# Patient Record
Sex: Female | Born: 1940 | Race: Black or African American | Hispanic: No | Marital: Married | Smoking: Never smoker
Health system: Southern US, Community
[De-identification: ages and names within clinical notes are randomized; demographics above are authoritative.]

## PROBLEM LIST (undated history)

## (undated) DIAGNOSIS — I10 Essential (primary) hypertension: Secondary | ICD-10-CM

## (undated) DIAGNOSIS — E119 Type 2 diabetes mellitus without complications: Secondary | ICD-10-CM

## (undated) DIAGNOSIS — K219 Gastro-esophageal reflux disease without esophagitis: Secondary | ICD-10-CM

## (undated) DIAGNOSIS — M810 Age-related osteoporosis without current pathological fracture: Secondary | ICD-10-CM

## (undated) DIAGNOSIS — E785 Hyperlipidemia, unspecified: Secondary | ICD-10-CM

## (undated) DIAGNOSIS — K5090 Crohn's disease, unspecified, without complications: Secondary | ICD-10-CM

## (undated) DIAGNOSIS — K859 Acute pancreatitis without necrosis or infection, unspecified: Secondary | ICD-10-CM

## (undated) HISTORY — PX: COLONOSCOPY: SHX174

## (undated) HISTORY — PX: BOWEL RESECTION: SHX1257

## (undated) HISTORY — DX: Type 2 diabetes mellitus without complications: E11.9

## (undated) HISTORY — DX: Acute pancreatitis without necrosis or infection, unspecified: K85.90

## (undated) HISTORY — DX: Essential (primary) hypertension: I10

## (undated) HISTORY — DX: Hyperlipidemia, unspecified: E78.5

## (undated) HISTORY — DX: Crohn's disease, unspecified, without complications: K50.90

## (undated) HISTORY — DX: Age-related osteoporosis without current pathological fracture: M81.0

## (undated) HISTORY — PX: PARTIAL HYSTERECTOMY: SHX80

---

## 2002-07-18 HISTORY — PX: RECTOVAGINAL FISTULA CLOSURE: SUR265

## 2015-11-17 ENCOUNTER — Encounter: Payer: Self-pay | Admitting: Internal Medicine

## 2015-12-08 ENCOUNTER — Encounter: Payer: Self-pay | Admitting: Gastroenterology

## 2015-12-08 ENCOUNTER — Ambulatory Visit (INDEPENDENT_AMBULATORY_CARE_PROVIDER_SITE_OTHER): Payer: Medicare (Managed Care) | Admitting: Gastroenterology

## 2015-12-08 VITALS — BP 116/71 | HR 84 | Temp 97.7°F | Ht 59.0 in | Wt 107.0 lb

## 2015-12-08 DIAGNOSIS — K50819 Crohn's disease of both small and large intestine with unspecified complications: Secondary | ICD-10-CM

## 2015-12-08 DIAGNOSIS — K5090 Crohn's disease, unspecified, without complications: Secondary | ICD-10-CM | POA: Insufficient documentation

## 2015-12-08 NOTE — Assessment & Plan Note (Addendum)
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 Has been on mercaptopurine for years. Recently when she was discharged from the hospital in [place] she was told to decrease mercaptopurine to every other day. Patient is unsure why. Last colonoscopy around 2008 per records. At this point patient states she is feeling better. Her abdominal pain is much improved. Continues to fluctuate with no bowel movement 1 day, followed by multiple loose stools the following. She is concerned about ongoing weight loss, loss of hair, poor nail growth.   Retrieve records from recent hospitalization as well as from Dr. Docia Furl.  She is at risk of nutritional deficiencies with multiple surgeries and history of Crohn's disease. Once reviewed, further recommendations to follow.

## 2015-12-08 NOTE — Progress Notes (Addendum)
REVIEWED-NO ADDITIONAL RECOMMENDATIONS.  Primary Care Physician:  Barney Drain, MD  Primary Gastroenterologist:  Barney Drain, MD   Chief Complaint  Patient presents with  . Crohn's Disease  . set up TCS    HPI:  Tamara Todd is a 75 y.o. female here at the request of her PCP to establish care. Patient has a history of complicated Crohn's involving what sounds like both her small bowel and colon. 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 She has had Crohn's for at least 30 years. Previously treated with prednisone. Developed SOB with second dose of Remicade in remote. Has bee on mercaptopurine for years.   Patient recently had a flare in 09/2015, hospitalized at [facility]. States she had inflammation of the pancreas and active Crohn's. Treated with steroids. Magnesium level has been low. Reports abnormal CT.  Last colonoscopy in 2008 by Dr. Algis Greenhouse. Records requested. No prior EGD.   States she was discharged with reduced dose of mercaptopurine, every other day now. She is not sure why. Days she takes the medication seems to have more stools. Will go without a BM one day and the next day have 5-7 loose stools. No melena, brbpr. RLQ abdominal pain much better. Appetite good. +indigestion. Complains of hair loss, nails don't grow. She has osteoporosis, no longer on injections. Just on calcium. Followed by endocrinology. Complains of 15 pound weight loss in past 18 months. Most of weight loss more recently.     Current Outpatient Prescriptions  Medication Sig Dispense Refill  . amLODipine (NORVASC) 10 MG tablet Take 10 mg by mouth daily.  1  . diclofenac (VOLTAREN) 75 MG EC tablet Take 75 mg by mouth daily.  1  . losartan-hydrochlorothiazide (HYZAAR) 50-12.5 MG tablet Take 1 tablet by mouth daily.  0  . magnesium oxide (MAG-OX) 400 (241.3 Mg) MG tablet Take 1 tablet by mouth 2 (two) times daily with a meal.   0  . mercaptopurine (PURINETHOL) 50 MG tablet TAKE 1 & 1/2 TABLETS BY MOUTH DAILY **1/10**  0  . metFORMIN (GLUCOPHAGE) 500 MG tablet TAKE 1/2 TABLET BY MOUTH DAILY FOR 1 WEEK THEN 1 TAB DAILY FOR 1 WEEK THEN 1 TAB TWICE DAILY  6  . metoprolol succinate (TOPROL-XL) 25 MG 24 hr tablet Take 25 mg by mouth daily.  3  . omeprazole (PRILOSEC) 20 MG capsule Take 20 mg by mouth daily.  3  . Vitamin D, Ergocalciferol, (DRISDOL) 50000 units CAPS capsule Take 50,000 Units by mouth once a week.  1   No current facility-administered medications for this visit.    Allergies as of 12/08/2015 - Review Complete 12/08/2015  Allergen Reaction Noted  . Lisinopril  12/08/2015  . Remicade [infliximab] Other (See Comments) 12/08/2015  . Codeine Rash 12/08/2015    Past Medical History  Diagnosis Date  . Osteoporosis        . HTN (hypertension)   . Hyperlipidemia   . Crohn's disease ([place])     used to be followed by Dr. Algis Greenhouse. diagnosis in her 21s. multiple surgeries. remicade allergy.  . Diabetes [facility])     Past Surgical History  Procedure Laterality Date  . Bowel resection      TWICE for stricture, including right colectomy with terminal ileal resection  . Rectovaginal fistula closure  2004    with temporary colostomy and subsequent take down, all at [facility]   . Partial hysterectomy      Family History  Problem Relation Age of Onset  . Colon cancer Father     32s  . Inflammatory bowel disease Sister     ???    Social History   Social History  . Marital Status: Unknown    Spouse Name: N/A  . Number of Children: 1  . Years of Education: N/A   Occupational History  . retired    Social History Main Topics  . Smoking status: Never Smoker   . Smokeless tobacco: Not on file  . Alcohol Use: No  . Drug Use: No  . Sexual Activity: Not on file   Other Topics Concern  . Not on file   Social History Narrative  . No narrative on file      ROS:  General: Negative for anorexia,  weight loss, fever, chills, fatigue, weakness. Eyes: Negative for vision changes.  ENT: Negative for hoarseness, difficulty swallowing , nasal congestion. CV: Negative for chest pain, angina, palpitations, dyspnea on exertion, peripheral edema.  Respiratory: Negative for dyspnea at rest, dyspnea on exertion, cough, sputum, wheezing.  GI: See history of present illness. GU:  Negative for dysuria, hematuria, urinary incontinence, urinary frequency, nocturnal urination.  MS: Negative for joint pain, low back pain.  Derm: Negative for rash or itching.  Neuro: Negative for weakness, abnormal sensation, seizure, frequent headaches, memory loss, confusion.  Psych: Negative for anxiety, depression, suicidal ideation, hallucinations.  Endo: Negative for unusual weight change.  Heme: Negative for bruising or bleeding. Allergy: Negative for rash or hives.    Physical Examination:  BP 116/71 mmHg  Pulse 84  Temp(Src) 97.7 F (36.5 C)  Ht 4' 11"  (1.499 m)  Wt 107 lb (48.535 kg)  BMI 21.60 kg/m2  LMP    General: Well-nourished, well-developed in no acute distress.  Head: Normocephalic, atraumatic.   Eyes: Conjunctiva pink, no icterus. Mouth: Oropharyngeal mucosa moist and pink , no lesions erythema or exudate. Neck: Supple without thyromegaly, masses, or lymphadenopathy.  Lungs: Clear to auscultation bilaterally.  Heart: Regular rate and rhythm, no murmurs rubs or gallops.  Abdomen: Bowel sounds are normal, mild rlq tenderness, nondistended, no hepatosplenomegaly or masses, no abdominal bruits or    hernia , no rebound or guarding.  Well-healed incisions. Rectal: not performed Extremities: No lower extremity edema. No clubbing or deformities.  Neuro: Alert and oriented x 4 , grossly normal neurologically.  Skin: Warm and dry, no rash or jaundice.   Psych: Alert and cooperative, normal mood and affect.  Labs: requested  Imaging Studies: No results found.

## 2015-12-08 NOTE — Patient Instructions (Signed)
1. We will request your records. Further recommendations, once reviewed.

## 2015-12-08 NOTE — Progress Notes (Signed)
CC'ED TO PCP 

## 2015-12-24 ENCOUNTER — Encounter: Payer: Self-pay | Admitting: Gastroenterology

## 2015-12-24 NOTE — Progress Notes (Addendum)
Reviewed records from outside source. Outlined below.   [facility] 10/09/2015 White blood cell count 2850, hemoglobin 9, hematocrit 27, MCV 109.8, platelets 155,000 INR 1.5, creatinine 0.44, total bilirubin 0.6, AST 44, ALT 42, alkaline phosphatase 72, the day prior her alkaline phosphatase is 124, AST 58, total bilirubin 1.2 all elevated. Lipase on admission over 10,000, day of discharge 1013, hemoglobin A1c 9.6.magnesium level 1.2 low  Abdominal ultrasound 10/08/2015. Common bile duct 9 mm slightly prominent. Gallbladder without gallbladder wall thickening or stones. There was some pericholecystic fluid present.  CT abdomen and pelvis with contrast on 10/08/2015. Gallbladder unremarkable. Vague stranding adjacent to the pancreas. Mild thickening of the distal rectum with vague stranding seen within the right abdomen adjacent to the posterior gallbladder and kidney.    Colonoscopy from July 2008, Dr. Docia Furl Small ulcerations in the distal small bowel, consistent with minimal recurrent disease. Ulcerations are just at the ileocolonic anastomosis. Biopsy with acute and chronic inflammation compatible with but not diagnostic of Crohn's disease of the distal small bowel.  2008, [facility] celiac plus panel was negative.   Please let patient know I have reviewed her records.  Recommendations: Labs including CBC, iron/TIBC, ferritin, B12, folate, CMET, magnesium level, PT/INR If she has not had bone density DEXA scan, she needs a baseline.

## 2015-12-29 ENCOUNTER — Other Ambulatory Visit: Payer: Self-pay

## 2015-12-29 DIAGNOSIS — K50819 Crohn's disease of both small and large intestine with unspecified complications: Secondary | ICD-10-CM

## 2015-12-29 LAB — CBC WITH DIFFERENTIAL/PLATELET
BASOS ABS: 38 {cells}/uL (ref 0–200)
BASOS PCT: 1 %
EOS ABS: 190 {cells}/uL (ref 15–500)
Eosinophils Relative: 5 %
HCT: 30.2 % — ABNORMAL LOW (ref 35.0–45.0)
HEMOGLOBIN: 9.7 g/dL — AB (ref 11.7–15.5)
LYMPHS ABS: 1064 {cells}/uL (ref 850–3900)
Lymphocytes Relative: 28 %
MCH: 33.6 pg — AB (ref 27.0–33.0)
MCHC: 32.1 g/dL (ref 32.0–36.0)
MCV: 104.5 fL — AB (ref 80.0–100.0)
MPV: 9.7 fL (ref 7.5–12.5)
Monocytes Absolute: 190 cells/uL — ABNORMAL LOW (ref 200–950)
Monocytes Relative: 5 %
NEUTROS ABS: 2318 {cells}/uL (ref 1500–7800)
Neutrophils Relative %: 61 %
Platelets: 179 10*3/uL (ref 140–400)
RBC: 2.89 MIL/uL — ABNORMAL LOW (ref 3.80–5.10)
RDW: 16.1 % — ABNORMAL HIGH (ref 11.0–15.0)
WBC: 3.8 10*3/uL (ref 3.8–10.8)

## 2015-12-29 LAB — PROTIME-INR
INR: 1
PROTHROMBIN TIME: 10.7 s (ref 9.0–11.5)

## 2015-12-29 NOTE — Progress Notes (Signed)
Pt is aware. Will do labs at [facility]. She said she had a Dexa about a year ago. She will call have those results sent to [place].

## 2015-12-30 LAB — COMPREHENSIVE METABOLIC PANEL
ALBUMIN: 3.7 g/dL (ref 3.6–5.1)
ALK PHOS: 58 U/L (ref 33–130)
ALT: 17 U/L (ref 6–29)
AST: 22 U/L (ref 10–35)
BILIRUBIN TOTAL: 0.4 mg/dL (ref 0.2–1.2)
BUN: 22 mg/dL (ref 7–25)
CHLORIDE: 103 mmol/L (ref 98–110)
CO2: 27 mmol/L (ref 20–31)
CREATININE: 0.77 mg/dL (ref 0.60–0.93)
Calcium: 10.5 mg/dL — ABNORMAL HIGH (ref 8.6–10.4)
Glucose, Bld: 96 mg/dL (ref 65–99)
Potassium: 4.1 mmol/L (ref 3.5–5.3)
SODIUM: 139 mmol/L (ref 135–146)
TOTAL PROTEIN: 7 g/dL (ref 6.1–8.1)

## 2015-12-30 LAB — VITAMIN B12: Vitamin B-12: 906 pg/mL (ref 200–1100)

## 2015-12-30 LAB — IRON AND TIBC
%SAT: 16 % (ref 11–50)
IRON: 69 ug/dL (ref 45–160)
TIBC: 432 ug/dL (ref 250–450)
UIBC: 363 ug/dL (ref 125–400)

## 2015-12-30 LAB — MAGNESIUM: MAGNESIUM: 1.5 mg/dL (ref 1.5–2.5)

## 2015-12-30 LAB — FOLATE: FOLATE: 22.7 ng/mL (ref >5.4)

## 2015-12-30 LAB — FERRITIN: FERRITIN: 29 ng/mL (ref 20–288)

## 2016-01-05 NOTE — Progress Notes (Signed)
Quick Note:  Called. Many rings and no answer. ______

## 2016-01-05 NOTE — Progress Notes (Signed)
Quick Note:  Her labs look good except her H/H is low (but stable compared to 09/2015). No B12, folate, iron deficiency.  She needs ileocolonoscopy+/- EGD for anemia, crohn's, idiopathic pancreatitis. WITH SLF Day of prep: 1/2 dose metformin.  Based on findings, she will likely need additional testing for idiopathic pancreatitis such as MRI. ______

## 2016-01-11 ENCOUNTER — Telehealth: Payer: Self-pay

## 2016-01-11 NOTE — Progress Notes (Signed)
Quick Note:  PT is aware of results. Triaged and scheduled for TCS with possible EGD. See separate triage. ______

## 2016-01-20 NOTE — Progress Notes (Signed)
Quick Note:  Routed back to [place]. ______

## 2016-01-29 NOTE — Telephone Encounter (Signed)
Gastroenterology Pre-Procedure Review  Request Date: 01/11/2016 Requesting Physician: Neil Crouch, PA/ Dr. Oneida Alar  PATIENT REVIEW QUESTIONS: The patient responded to the following health history questions as indicated:    Pt was triaged for a ileocolonoscopy plus or minus an EGD per Neil Crouch, PA  1. Diabetes Melitis: no 2. Joint replacements in the past 12 months: no 3. Major health problems in the past 3 months: no 4. Has an artificial valve or MVP: no 5. Has a defibrillator: no 6. Has been advised in past to take antibiotics in advance of a procedure like teeth cleaning: no 7. Family history of colon cancer: no  8. Alcohol Use: no 9. History of sleep apnea: no     MEDICATIONS & ALLERGIES:    Patient reports the following regarding taking any blood thinners:   Plavix? no Aspirin? no Coumadin? no  Patient confirms/reports the following medications:  Current Outpatient Prescriptions  Medication Sig Dispense Refill  . amLODipine (NORVASC) 10 MG tablet Take 10 mg by mouth daily.  1  . diclofenac (VOLTAREN) 75 MG EC tablet Take 75 mg by mouth daily.  1  . losartan-hydrochlorothiazide (HYZAAR) 50-12.5 MG tablet Take 1 tablet by mouth daily.  0  . magnesium oxide (MAG-OX) 400 (241.3 Mg) MG tablet Take 1 tablet by mouth 2 (two) times daily with a meal.  0  . mercaptopurine (PURINETHOL) 50 MG tablet Takes one and half tablets every other day  0  . metFORMIN (GLUCOPHAGE) 500 MG tablet TAKE 1/2 TABLET BY MOUTH DAILY FOR 1 WEEK THEN 1 TAB DAILY FOR 1 WEEK THEN 1 TAB TWICE DAILY  6  . metoprolol succinate (TOPROL-XL) 25 MG 24 hr tablet Take 25 mg by mouth daily.  3  . omeprazole (PRILOSEC) 20 MG capsule Take 20 mg by mouth daily.  3  . Vitamin D, Ergocalciferol, (DRISDOL) 50000 units CAPS capsule Take 50,000 Units by mouth once a week.  1   No current facility-administered medications for this visit.    Patient confirms/reports the following allergies:  Allergies  Allergen  Reactions  . Lisinopril     Cough   . Remicade [Infliximab] Other (See Comments)    Couldn't breath  . Codeine Rash    No orders of the defined types were placed in this encounter.    AUTHORIZATION INFORMATION Primary Insurance:   ID #:   Group #:  Pre-Cert / Auth required: Pre-Cert / Auth #:   Secondary Insurance:   ID #: Group #:  Pre-Cert / Auth required:  Pre-Cert / Auth #:   SCHEDULE INFORMATION: Procedure has been scheduled as follows:  Date:  02/10/2016                      Time: 9:30 Am  Location: [facility] Short Stay  This Gastroenterology Pre-Precedure Review Form is being routed to the following provider(s): Barney Drain, MD

## 2016-02-01 NOTE — Telephone Encounter (Signed)
Day of prep 1/2 dose metformin.

## 2016-02-02 ENCOUNTER — Other Ambulatory Visit: Payer: Self-pay

## 2016-02-02 ENCOUNTER — Telehealth: Payer: Self-pay | Admitting: Gastroenterology

## 2016-02-02 DIAGNOSIS — D649 Anemia, unspecified: Secondary | ICD-10-CM

## 2016-02-02 DIAGNOSIS — K50919 Crohn's disease, unspecified, with unspecified complications: Secondary | ICD-10-CM

## 2016-02-02 DIAGNOSIS — K861 Other chronic pancreatitis: Secondary | ICD-10-CM

## 2016-02-02 MED ORDER — PEG 3350-KCL-NA BICARB-NACL 420 G PO SOLR: 4000.0000 mL | ORAL | Status: DC

## 2016-02-02 NOTE — Addendum Note (Signed)
Addended by: Everardo All on: 02/02/2016 11:28 AM   Modules accepted: Orders

## 2016-02-02 NOTE — Telephone Encounter (Signed)
Bone density study 04/2014: AP spine T -4.8 Femoral neck T -2.6 Total Hip T -1.7   Osteoporosis per WHO criteria.   Patient is not on medication for osteoporosis. Would recommend discuss potential treatments with her endocrinologist or her PCP.

## 2016-02-02 NOTE — Telephone Encounter (Signed)
Rx sent to the pharmacy and instructions mailed to pt. Tamara Todd is aware to put ileocolonoscopy in the comments.

## 2016-02-03 NOTE — Telephone Encounter (Signed)
Pt is aware.  

## 2016-02-05 NOTE — Telephone Encounter (Signed)
No PA is needed per phone call. Ref# [id number]

## 2016-02-10 ENCOUNTER — Ambulatory Visit (HOSPITAL_COMMUNITY)
Admission: RE | Admit: 2016-02-10 | Discharge: 2016-02-10 | Disposition: A | Discharge status: Home / Self Care | Payer: Medicare Other | Source: Ambulatory Visit | Attending: Gastroenterology | Admitting: Gastroenterology

## 2016-02-10 ENCOUNTER — Encounter (HOSPITAL_COMMUNITY): Payer: Self-pay | Admitting: *Deleted

## 2016-02-10 ENCOUNTER — Encounter (HOSPITAL_COMMUNITY)
Admission: RE | Disposition: A | Discharge status: Home / Self Care | Payer: Self-pay | Source: Ambulatory Visit | Attending: Gastroenterology

## 2016-02-10 DIAGNOSIS — K621 Rectal polyp: Secondary | ICD-10-CM

## 2016-02-10 DIAGNOSIS — D128 Benign neoplasm of rectum: Secondary | ICD-10-CM | POA: Diagnosis not present

## 2016-02-10 DIAGNOSIS — K861 Other chronic pancreatitis: Secondary | ICD-10-CM

## 2016-02-10 DIAGNOSIS — K219 Gastro-esophageal reflux disease without esophagitis: Secondary | ICD-10-CM | POA: Diagnosis not present

## 2016-02-10 DIAGNOSIS — K5730 Diverticulosis of large intestine without perforation or abscess without bleeding: Secondary | ICD-10-CM | POA: Diagnosis not present

## 2016-02-10 DIAGNOSIS — I10 Essential (primary) hypertension: Secondary | ICD-10-CM | POA: Insufficient documentation

## 2016-02-10 DIAGNOSIS — R059 Cough, unspecified: Secondary | ICD-10-CM

## 2016-02-10 DIAGNOSIS — Z9049 Acquired absence of other specified parts of digestive tract: Secondary | ICD-10-CM | POA: Insufficient documentation

## 2016-02-10 DIAGNOSIS — Z800 Family history of malignant neoplasm of digestive organs: Secondary | ICD-10-CM | POA: Insufficient documentation

## 2016-02-10 DIAGNOSIS — Z79899 Other long term (current) drug therapy: Secondary | ICD-10-CM | POA: Insufficient documentation

## 2016-02-10 DIAGNOSIS — R634 Abnormal weight loss: Secondary | ICD-10-CM | POA: Diagnosis not present

## 2016-02-10 DIAGNOSIS — K2950 Unspecified chronic gastritis without bleeding: Secondary | ICD-10-CM | POA: Insufficient documentation

## 2016-02-10 DIAGNOSIS — E119 Type 2 diabetes mellitus without complications: Secondary | ICD-10-CM | POA: Insufficient documentation

## 2016-02-10 DIAGNOSIS — Z6822 Body mass index (BMI) 22.0-22.9, adult: Secondary | ICD-10-CM | POA: Insufficient documentation

## 2016-02-10 DIAGNOSIS — K5080 Crohn's disease of both small and large intestine without complications: Secondary | ICD-10-CM | POA: Diagnosis not present

## 2016-02-10 DIAGNOSIS — M810 Age-related osteoporosis without current pathological fracture: Secondary | ICD-10-CM | POA: Insufficient documentation

## 2016-02-10 DIAGNOSIS — Z7984 Long term (current) use of oral hypoglycemic drugs: Secondary | ICD-10-CM | POA: Insufficient documentation

## 2016-02-10 DIAGNOSIS — K449 Diaphragmatic hernia without obstruction or gangrene: Secondary | ICD-10-CM | POA: Insufficient documentation

## 2016-02-10 DIAGNOSIS — K648 Other hemorrhoids: Secondary | ICD-10-CM | POA: Diagnosis not present

## 2016-02-10 DIAGNOSIS — K634 Enteroptosis: Secondary | ICD-10-CM

## 2016-02-10 DIAGNOSIS — K2970 Gastritis, unspecified, without bleeding: Secondary | ICD-10-CM

## 2016-02-10 DIAGNOSIS — K317 Polyp of stomach and duodenum: Secondary | ICD-10-CM | POA: Diagnosis not present

## 2016-02-10 DIAGNOSIS — K50919 Crohn's disease, unspecified, with unspecified complications: Secondary | ICD-10-CM

## 2016-02-10 DIAGNOSIS — Z980 Intestinal bypass and anastomosis status: Secondary | ICD-10-CM | POA: Diagnosis not present

## 2016-02-10 DIAGNOSIS — R05 Cough: Secondary | ICD-10-CM

## 2016-02-10 DIAGNOSIS — D649 Anemia, unspecified: Secondary | ICD-10-CM

## 2016-02-10 HISTORY — DX: Gastro-esophageal reflux disease without esophagitis: K21.9

## 2016-02-10 HISTORY — PX: BIOPSY: SHX5522

## 2016-02-10 HISTORY — PX: COLONOSCOPY: SHX5424

## 2016-02-10 HISTORY — PX: ESOPHAGOGASTRODUODENOSCOPY: SHX5428

## 2016-02-10 HISTORY — PX: POLYPECTOMY: SHX5525

## 2016-02-10 HISTORY — DX: Essential (primary) hypertension: I10

## 2016-02-10 LAB — GLUCOSE, CAPILLARY: Glucose-Capillary: 103 mg/dL — ABNORMAL HIGH (ref 65–99)

## 2016-02-10 LAB — TSH: TSH: 0.611 u[IU]/mL (ref 0.350–4.500)

## 2016-02-10 SURGERY — COLONOSCOPY
Anesthesia: Moderate Sedation

## 2016-02-10 MED ORDER — SODIUM CHLORIDE 0.9 % IV SOLN
INTRAVENOUS | Status: DC
  Administered 2016-02-10: 09:00:00 via INTRAVENOUS

## 2016-02-10 MED ORDER — MEPERIDINE HCL 100 MG/ML IJ SOLN
INTRAMUSCULAR | Status: DC | PRN
  Administered 2016-02-10: 25 mg via INTRAVENOUS

## 2016-02-10 MED ORDER — MERCAPTOPURINE 50 MG PO TABS: ORAL_TABLET | ORAL | 3 refills | Status: DC

## 2016-02-10 MED ORDER — STERILE WATER FOR IRRIGATION IR SOLN
Status: DC | PRN
  Administered 2016-02-10: 11:00:00

## 2016-02-10 MED ORDER — MIDAZOLAM HCL 5 MG/5ML IJ SOLN
INTRAMUSCULAR | Status: AC
  Filled 2016-02-10: qty 10

## 2016-02-10 MED ORDER — LIDOCAINE VISCOUS 2 % MT SOLN
OROMUCOSAL | Status: AC
  Filled 2016-02-10: qty 15

## 2016-02-10 MED ORDER — MEPERIDINE HCL 100 MG/ML IJ SOLN
INTRAMUSCULAR | Status: AC
  Filled 2016-02-10: qty 2

## 2016-02-10 MED ORDER — MIDAZOLAM HCL 5 MG/5ML IJ SOLN
INTRAMUSCULAR | Status: DC | PRN
  Administered 2016-02-10: 1 mg via INTRAVENOUS
  Administered 2016-02-10 (×2): 2 mg via INTRAVENOUS

## 2016-02-10 NOTE — H&P (Signed)
Primary Care Physician:  [name], NP Primary Gastroenterologist:  Dr. Oneida Alar  Pre-Procedure History & Physical: HPI:  Tamara Todd is a 75 y.o. female here for Weight loss/COMPLICATED COURSE OF CROHN'S DISEASE.   Past Medical History:  Diagnosis Date  . Crohn's disease ([place])    used to be followed by Dr. Algis Greenhouse. diagnosis in her 58s. multiple surgeries. remicade allergy.  . Diabetes ([place])   . GERD (gastroesophageal reflux disease)   . HTN (hypertension)   . Hyperlipidemia   . Hypertension   . Osteoporosis       . Pancreatitis    09/2015    Past Surgical History:  Procedure Laterality Date  . BOWEL RESECTION     TWICE for stricture, including right colectomy with terminal ileal resection  . COLONOSCOPY    . PARTIAL HYSTERECTOMY    . RECTOVAGINAL FISTULA CLOSURE  2004   with temporary colostomy and subsequent take down, all at [facility].     Prior to Admission medications   Medication Sig Start Date End Date Taking? Authorizing Provider  amLODipine (NORVASC) 10 MG tablet Take 10 mg by mouth daily. 11/17/15  Yes Historical Provider, MD  calcium carbonate (OS-CAL - DOSED IN MG OF ELEMENTAL CALCIUM) 1250 (500 Ca) MG tablet Take 1 tablet by mouth 2 (two) times daily with a meal.   Yes Historical Provider, MD  cyanocobalamin (,VITAMIN B-12,) 1000 MCG/ML injection Inject 1 mL into the muscle every 14 (fourteen) days. 01/08/16  Yes Historical Provider, MD  diclofenac (VOLTAREN) 75 MG EC tablet Take 75 mg by mouth daily. 11/17/15  Yes Historical Provider, MD  losartan-hydrochlorothiazide (HYZAAR) 50-12.5 MG tablet Take 1 tablet by mouth daily. 11/13/15  Yes Historical Provider, MD  magnesium oxide (MAG-OX) 400 (241.3 Mg) MG tablet Take 1 tablet by mouth 2 (two) times daily with a meal. 10/11/15  Yes Historical Provider, MD  mercaptopurine (PURINETHOL) 50 MG tablet Takes one and half tablets every other day 09/03/15  Yes Historical Provider, MD  metFORMIN (GLUCOPHAGE) 500 MG tablet TAKE 1  TABLET BY MOUTH TWICE DAILY. 10/24/15  Yes Historical Provider, MD  metoprolol succinate (TOPROL-XL) 25 MG 24 hr tablet Take 25 mg by mouth daily. 10/09/15  Yes Historical Provider, MD  omeprazole (PRILOSEC) 20 MG capsule Take 20 mg by mouth daily. 10/25/15  Yes Historical Provider, MD  polyethylene glycol-electrolytes (TRILYTE) 420 g solution Take 4,000 mLs by mouth as directed. 02/02/16  Yes Danie Binder, MD  Vitamin D, Ergocalciferol, (DRISDOL) 50000 units CAPS capsule Take 50,000 Units by mouth once a week. Takes on Mondays. 09/24/15  Yes Historical Provider, MD    Allergies as of 02/02/2016 - Review Complete 01/11/2016  Allergen Reaction Noted  . Lisinopril  12/08/2015  . Remicade [infliximab] Other (See Comments) 12/08/2015  . Codeine Rash 12/08/2015    Family History  Problem Relation Age of Onset  . Colon cancer Father     67s  . Inflammatory bowel disease Sister     ???    Social History   Social History  . Marital status: Married    Spouse name: N/A  . Number of children: 1  . Years of education: N/A   Occupational History  . retired    Social History Main Topics  . Smoking status: Never Smoker  . Smokeless tobacco: Never Used  . Alcohol use No  . Drug use: No  . Sexual activity: Not on file   Other Topics Concern  . Not on file  Social History Narrative  . No narrative on file    Review of Systems: See HPI, otherwise negative ROS   Physical Exam: BP 137/68   Pulse 65   Temp 98.5 F (36.9 C) (Oral)   Resp 20   Ht 4' 11"  (1.499 m)   Wt 109 lb (49.4 kg)   SpO2 100%   BMI 22.02 kg/m  General:   Alert,  pleasant and cooperative in NAD Head:  Normocephalic and atraumatic. Neck:  Supple; Lungs:  Clear throughout to auscultation.    Heart:  Regular rate and rhythm. Abdomen:  Soft, nontender and nondistended. Normal bowel sounds, without guarding, and without rebound.   Neurologic:  Alert and  oriented x4;  grossly normal  neurologically.  Impression/Plan:    Weight loss/COMPLICATED COURSE OF CROHN'S DISEASE  PLAN: 1. TCS/POSSIBLE EGD TODAY. DISCUSSED PROCEDURE, BENEFITS, & RISKS: < 1% chance of medication reaction, bleeding, perforation, or rupture of spleen/liver.

## 2016-02-10 NOTE — Progress Notes (Signed)
Discharged to Radiology with sister Janell Quiet via wheelchair.  Per Dr. Oneida Alar patient may go home after CXR complete.

## 2016-02-10 NOTE — Op Note (Signed)
[facility] Patient Name: Tamara Todd Procedure Date: 02/10/2016 10:17 AM MRN: [id number] Date of Birth: [birth year] Attending MD: Barney Drain , MD CSN: [id number] Age: 75 Admit Type: Outpatient Procedure:                Colonoscopy WITH COLD FORCEPS BIOPSY/SNARE                            POLYPECTOMY Indications:              Crohn's disease of the small bowel and colon FOR 30                            YEARS & Family history of colon cancer in multiple                            first-degree relatives(BRO IN HIS 80s, SUCCUMBED;                            FATHER AGE > 64). USING VOLTAREN FLARE IN [facility] 2017                            RESOLVED(RLQ PAIN, BLOODY DIARRHEA). HAD                            MERCAPTOPRUINE SWITCHED TO QOD BASED ON IMAGING BUT                            PT W/O SYMPTOMS OF PANCREATITIS. UNEXPLAINED WEIGHT                            LOSS. Providers:                Barney Drain, MD, Renda Rolls, RN, Isabella Stalling,                            Technician Referring MD:              Medicines:                Meperidine 25 mg IV, Midazolam 3 mg IV Complications:            No immediate complications. Estimated Blood Loss:     Estimated blood loss was minimal.                           Estimated blood loss was minimal. Procedure:                Pre-Anesthesia Assessment:                           - Prior to the procedure, a History and Physical                            was performed, and patient medications and  allergies were reviewed. The patient's tolerance of                            previous anesthesia was also reviewed. The risks                            and benefits of the procedure and the sedation                            options and risks were discussed with the patient.                            All questions were answered, and informed consent                            was obtained. Prior Anticoagulants: The patient  has                            taken previous NSAID medication, last dose was day                            of procedure. ASA Grade Assessment: II - A patient                            with mild systemic disease. After reviewing the                            risks and benefits, the patient was deemed in                            satisfactory condition to undergo the procedure.                           After obtaining informed consent, the colonoscope                            was passed under direct vision. Throughout the                            procedure, the patient's blood pressure, pulse, and                            oxygen saturations were monitored continuously. The                            EC-[id number] ([id number]) scope was introduced through                            the anus and advanced to the 10 cm into the ileum.                            The colonoscopy was somewhat difficult due to  significant looping. Successful completion of the                            procedure was aided by COLOWRAP. The patient                            tolerated the procedure fairly well. The quality of                            the bowel preparation was excellent. The terminal                            ileum and the rectum were photographed. Scope In: 10:41:51 AM Scope Out: 16:10:96 AM Scope Withdrawal Time: 0 hours 18 minutes 54 seconds  Total Procedure Duration: 0 hours 24 minutes 51 seconds  Findings:      The neo-terminal ileum appeared normal.      The anastomosis appeared normal.      A few small and large-mouthed diverticula were found in the sigmoid       colon.      The descending colon and transverse colon appeared normal. Four biopsies       were taken every 10 cm with a cold forceps from the transverse colon,       descending colon, sigmoid colon and rectum for Crohn's disease       surveillance(FROM 60 CM TO 10 CM FROM THE ANAL VERGE). These  biopsy       specimens were sent to Pathology.      Non-bleeding internal hemorrhoids were found. The hemorrhoids were small.      A 6 mm polyp was found in the rectum. The polyp was sessile. The polyp       was removed with a hot snare. Resection and retrieval were complete. Impression:               - NO SOURCE FOR WEIGHT LOSS IDENTIFIED                           - The colonic anastomosis is normal.                           - MILD Diverticulosis in the sigmoid colon.                           - Non-bleeding internal hemorrhoids. Moderate Sedation:      Moderate (conscious) sedation was administered by the endoscopy nurse       and supervised by the endoscopist. The following parameters were       monitored: oxygen saturation, heart rate, blood pressure, and response       to care. Total physician intraservice time was 54 minutes. Recommendation:           - High fiber diet.                           - Continue present medications.                           - Await pathology results.                           -  Repeat colonoscopy date to be determined after                            pending pathology results are reviewed for                            surveillance. FAVOR NEXT TCS IN 1-3 YEARS DUE TO                            NEED FOR CROHN'S SURVEILLANCE.                           To complete your evaluation for unexplained weight                            loss, CHECK TSH AND CHEST XRAY PA/LAT TODAY.                           NO VOLTAREN, ASPIRIN, BC/GOODY POWDERS,                            IBUPROFEN/MOTRIN, OR NAPROXEN/ALEVE BECAUSE THEY                            MAKE A CROHN'S FLARES MORE LIKELY.                           RESUME MERCAPTOPURINE DAILY.                           CONTINUE OMEPRAZOLE EVERY MORNING.                           DRINK WATER TO KEEP YOUR URINE LIGHT YELLOW.                           - Patient has a contact number available for                             emergencies. The signs and symptoms of potential                            delayed complications were discussed with the                            patient. Return to normal activities tomorrow.                            Written discharge instructions were provided to the                            patient. Procedure Code(s):        --- Professional ---                           [phone],  Colonoscopy, flexible; with removal of                            tumor(s), polyp(s), or other lesion(s) by snare                            technique                           45380, 59, Colonoscopy, flexible; with biopsy,                            single or multiple                           99152, Moderate sedation services provided by the                            same physician or other qualified health care                            professional performing the diagnostic or                            therapeutic service that the sedation supports,                            requiring the presence of an independent trained                            observer to assist in the monitoring of the                            patient's level of consciousness and physiological                            status; initial 15 minutes of intraservice time,                            patient age 52 years or older                           [phone], Moderate sedation services; each additional                            15 minutes intraservice time                           99153, Moderate sedation services; each additional                            15 minutes intraservice time                           99153, Moderate sedation services; each additional  15 minutes intraservice time Diagnosis Code(s):        --- Professional ---                           K64.8, Other hemorrhoids                           K50.80, Crohn's disease of both small and large                            intestine  without complications                           Z80.0, Family history of malignant neoplasm of                            digestive organs                           K57.30, Diverticulosis of large intestine without                            perforation or abscess without bleeding CPT copyright 2016 [employer]. All rights reserved. The codes documented in this report are preliminary and upon coder review may  be revised to meet current compliance requirements. Barney Drain, MD Barney Drain, MD 02/10/2016 1:34:33 PM This report has been signed electronically. Number of Addenda: 0

## 2016-02-10 NOTE — Discharge Instructions (Signed)
YOU HAVE a ring at the base of your esophagus, Small hiatal HERNIA, FEW small stomach polyps, and GASTRITIS most likely due to VOLTAREN  You have MODERATE SIZE internal hemorrhoids & LEFT COLON DIVERTICULOSIS. OTHERWISE YOUR SMALL BOWEL AND COLON LOOK NORMAL. YOU HAD ONE RECTAL POLYP REMOVED. I BIOPSIED YOUR STOMACH AND COLON.   NO OBVIOUS SOURCE FOR YOUR UNINTENTIONAL WEIGHT LOSS HAS BEEN IDENTIFIED.   To complete your evaluation for unexplained weight loss, WE WILL CHECK YOUR THYROID AND CHEST XRAY TODAY.  NO VOLTAREN, ASPIRIN, BC/GOODY POWDERS, IBUPROFEN/MOTRIN, OR NAPROXEN/ALEVE BECAUSE THEY MAKE CROHN'S FLARES MORE LIKELY.  RESUME MERCAPTOPURINE DAILY.  CONTINUE OMEPRAZOLE EVERY MORNING.  DRINK WATER TO KEEP YOUR URINE LIGHT YELLOW.  FOLLOW A HIGH FIBER DIET. AVOID ITEMS THAT CAUSE BLOATING. SEE INFO BELOW.  YOUR BIOPSY RESULTS WILL BE AVAILABLE IN MY CHART AFTER JUL 29 AND MY OFFICE WILL CONTACT YOU IN 10-14 DAYS WITH YOUR RESULTS.   FOLLOW UP IN 3 MOS.   ENDOSCOPY Care After Read the instructions outlined below and refer to this sheet in the next week. These discharge instructions provide you with general information on caring for yourself after you leave the hospital. While your treatment has been planned according to the most current medical practices available, unavoidable complications occasionally occur. If you have any problems or questions after discharge, call DR. Teriyah Purington, [phone].  ACTIVITY  You may resume your regular activity, but move at a slower pace for the next 24 hours.   Take frequent rest periods for the next 24 hours.   Walking will help get rid of the air and reduce the bloated feeling in your belly (abdomen).   No driving for 24 hours (because of the medicine (anesthesia) used during the test).   You may shower.   Do not sign any important legal documents or operate any machinery for 24 hours (because of the anesthesia used during the test).     NUTRITION  Drink plenty of fluids.   You may resume your normal diet as instructed by your doctor.   Begin with a light meal and progress to your normal diet. Heavy or fried foods are harder to digest and may make you feel sick to your stomach (nauseated).   Avoid alcoholic beverages for 24 hours or as instructed.    MEDICATIONS  You may resume your normal medications.   WHAT YOU CAN EXPECT TODAY  Some feelings of bloating in the abdomen.   Passage of more gas than usual.   Spotting of blood in your stool or on the toilet paper  .  IF YOU HAD POLYPS REMOVED DURING THE ENDOSCOPY:  Eat a soft diet IF YOU HAVE NAUSEA, BLOATING, ABDOMINAL PAIN, OR VOMITING.    FINDING OUT THE RESULTS OF YOUR TEST Not all test results are available during your visit. DR. Oneida Alar WILL CALL YOU WITHIN 14 DAYS OF YOUR PROCEDUE WITH YOUR RESULTS. Do not assume everything is normal if you have not heard from DR. Amar Keenum, CALL HER OFFICE AT [phone].  SEEK IMMEDIATE MEDICAL ATTENTION AND CALL THE OFFICE: [phone] IF:  You have more than a spotting of blood in your stool.   Your belly is swollen (abdominal distention).   You are nauseated or vomiting.   You have a temperature over 101F.   You have abdominal pain or discomfort that is severe or gets worse throughout the day.   Gastritis  Gastritis is an inflammation (the body's way of reacting to injury and/or infection) of the  stomach. It is often caused by bacterial (germ) infections. It can also be caused BY ASPIRIN, BC/GOODY POWDER'S, VOLTAREN, (IBUPROFEN) MOTRIN, OR ALEVE (NAPROXEN), chemicals (including alcohol), SPICY FOODS, and medications. This illness may be associated with generalized malaise (feeling tired, not well), UPPER ABDOMINAL STOMACH cramps, and fever. One common bacterial cause of gastritis is an organism known as H. Pylori. This can be treated with antibiotics.    High-Fiber Diet A high-fiber diet changes your  normal diet to include more whole grains, legumes, fruits, and vegetables. Changes in the diet involve replacing refined carbohydrates with unrefined foods. The calorie level of the diet is essentially unchanged. The Dietary Reference Intake (recommended amount) for adult males is 38 grams per day. For adult females, it is 25 grams per day. Pregnant and lactating women should consume 28 grams of fiber per day. Fiber is the intact part of a plant that is not broken down during digestion. Functional fiber is fiber that has been isolated from the plant to provide a beneficial effect in the body. PURPOSE  Increase stool bulk.   Ease and regulate bowel movements.   Lower cholesterol.   REDUCE RISK OF COLON CANCER  INDICATIONS THAT YOU NEED MORE FIBER  Constipation and hemorrhoids.   Uncomplicated diverticulosis (intestine condition) and irritable bowel syndrome.   As a protective measure against hardening of the arteries (atherosclerosis), diabetes, and cancer.   GUIDELINES FOR INCREASING FIBER IN THE DIET  Start adding fiber to the diet slowly. A gradual increase of about 5 more grams (2 slices of whole-wheat bread, 2 servings of most fruits or vegetables, or 1 bowl of high-fiber cereal) per day is best. Too rapid an increase in fiber may result in constipation, flatulence, and bloating.   Drink enough water and fluids to keep your urine clear or pale yellow. Water, juice, or caffeine-free drinks are recommended. Not drinking enough fluid may cause constipation.   Eat a variety of high-fiber foods rather than one type of fiber.   Try to increase your intake of fiber through using high-fiber foods rather than fiber pills or supplements that contain small amounts of fiber.   The goal is to change the types of food eaten. Do not supplement your present diet with high-fiber foods, but replace foods in your present diet.   INCLUDE A VARIETY OF FIBER SOURCES  Replace refined and processed  grains with whole grains, canned fruits with fresh fruits, and incorporate other fiber sources. White rice, white breads, and most bakery goods contain little or no fiber.   Brown whole-grain rice, buckwheat oats, and many fruits and vegetables are all good sources of fiber. These include: broccoli, Brussels sprouts, cabbage, cauliflower, beets, sweet potatoes, white potatoes (skin on), carrots, tomatoes, eggplant, squash, berries, fresh fruits, and dried fruits.   Cereals appear to be the richest source of fiber. Cereal fiber is found in whole grains and bran. Bran is the fiber-rich outer coat of cereal grain, which is largely removed in refining. In whole-grain cereals, the bran remains. In breakfast cereals, the largest amount of fiber is found in those with "bran" in their names. The fiber content is sometimes indicated on the label.   You may need to include additional fruits and vegetables each day.   In baking, for 1 cup white flour, you may use the following substitutions:   1 cup whole-wheat flour minus 2 tablespoons.   1/2 cup white flour plus 1/2 cup whole-wheat flour.   Hemorrhoids Hemorrhoids are dilated (enlarged) veins  around the rectum. Sometimes clots will form in the veins. This makes them swollen and painful. These are called thrombosed hemorrhoids. Causes of hemorrhoids include:  Constipation.   Straining to have a bowel movement.   HEAVY LIFTING  HOME CARE INSTRUCTIONS  Eat a well balanced diet and drink 6 to 8 glasses of water every day to avoid constipation. You may also use a bulk laxative.   Avoid straining to have bowel movements.   Keep anal area dry and clean.   Do not use a donut shaped pillow or sit on the toilet for long periods. This increases blood pooling and pain.   Move your bowels when your body has the urge; this will require less straining and will decrease pain and pressure. =

## 2016-02-10 NOTE — Op Note (Signed)
[facility] Patient Name: Tamara Todd Procedure Date: 02/10/2016 11:09 AM MRN: [id number] Date of Birth: 03-Dec-1940 Attending MD: Barney Drain , MD CSN: [id number] Age: 75 Admit Type: Outpatient Procedure:                Upper GI endoscopy WITH COLD FORCEPS BIOPSY Indications:              Weight loss, UNEXPLAINED Providers:                Barney Drain, MD, Renda Rolls, RN, Isabella Stalling,                            Technician Referring MD:             [name], NP Medicines:                TCS + Midazolam 2 mg IV Complications:            No immediate complications. Estimated Blood Loss:     Estimated blood loss was minimal. Procedure:                Pre-Anesthesia Assessment:                           - Prior to the procedure, a History and Physical                            was performed, and patient medications and                            allergies were reviewed. The patient's tolerance of                            previous anesthesia was also reviewed. The risks                            and benefits of the procedure and the sedation                            options and risks were discussed with the patient.                            All questions were answered, and informed consent                            was obtained. Prior Anticoagulants: The patient has                            taken previous NSAID medication, last dose was day                            of procedure. ASA Grade Assessment: II - A patient                            with mild systemic disease. After reviewing the  risks and benefits, the patient was deemed in                            satisfactory condition to undergo the procedure.                            After obtaining informed consent, the endoscope was                            passed under direct vision. Throughout the                            procedure, the patient's blood pressure, pulse, and                         oxygen saturations were monitored continuously. The                            Endoscope was introduced through the mouth, and                            advanced to the second part of duodenum. The upper                            GI endoscopy was accomplished with ease. The                            patient tolerated the procedure well. Scope In: 11:13:31 AM Scope Out: 11:24:40 AM Total Procedure Duration: 0 hours 11 minutes 9 seconds  Findings:      The examined esophagus was normal.      Localized mild inflammation was found in the stomach. Biopsies were       taken with a cold forceps for Helicobacter pylori testing.      A small hiatal hernia was present.      A few small sessile polyps were found in the cardia. This was biopsied       with a cold forceps for histology.      The examined duodenum was normal. Impression:               - MILD Gastritis.                           - Small hiatal hernia.                           - A few BENIGN APPEARING gastric polyps.                           - NO SOURCE FOR WEIGHT LOSS IDENTIFIED Moderate Sedation:      Moderate (conscious) sedation was administered by the endoscopy nurse       and supervised by the endoscopist. The following parameters were       monitored: oxygen saturation, heart rate, blood pressure, and response       to care. Total physician intraservice time was 54 minutes. Recommendation:           -  High fiber diet.                           - Continue present medications.                           - Await pathology results.                           - Return to my office in 3 months.                           To complete your evaluation for unexplained weight                            loss, CHECK TSH AND CHEST XRAY PA/LAT TODAY.                           NO VOLTAREN, ASPIRIN, BC/GOODY POWDERS,                            IBUPROFEN/MOTRIN, OR NAPROXEN/ALEVE BECAUSE THEY                             MAKE A CROHN'S FLARES MORE LIKELY.                           RESUME MERCAPTOPURINE DAILY.                           CONTINUE OMEPRAZOLE EVERY MORNING.                           DRINK WATER TO KEEP YOUR URINE LIGHT YELLOW.                           - Patient has a contact number available for                            emergencies. The signs and symptoms of potential                            delayed complications were discussed with the                            patient. Return to normal activities tomorrow.                            Written discharge instructions were provided to the                            patient. Procedure Code(s):        --- Professional ---                           [phone], Esophagogastroduodenoscopy, flexible,  transoral; with biopsy, single or multiple                           99152, Moderate sedation services provided by the                            same physician or other qualified health care                            professional performing the diagnostic or                            therapeutic service that the sedation supports,                            requiring the presence of an independent trained                            observer to assist in the monitoring of the                            patient's level of consciousness and physiological                            status; initial 15 minutes of intraservice time,                            patient age 56 years or older                           [phone], Moderate sedation services; each additional                            15 minutes intraservice time                           99153, Moderate sedation services; each additional                            15 minutes intraservice time                           99153, Moderate sedation services; each additional                            15 minutes intraservice time Diagnosis Code(s):        --- Professional ---                            K29.70, Gastritis, unspecified, without bleeding                           K44.9, Diaphragmatic hernia without obstruction or                            gangrene  K31.7, Polyp of stomach and duodenum                           R63.4, Abnormal weight loss CPT copyright 2016 [employer]. All rights reserved. The codes documented in this report are preliminary and upon coder review may  be revised to meet current compliance requirements. Barney Drain, MD Barney Drain, MD 02/10/2016 1:02:32 PM This report has been signed electronically. Number of Addenda: 0

## 2016-02-11 ENCOUNTER — Encounter: Payer: Self-pay | Admitting: Gastroenterology

## 2016-02-12 ENCOUNTER — Encounter (HOSPITAL_COMMUNITY): Payer: Self-pay | Admitting: Gastroenterology

## 2016-02-13 ENCOUNTER — Telehealth: Payer: Self-pay | Admitting: Gastroenterology

## 2016-02-13 NOTE — Telephone Encounter (Signed)
Please call pt. She had ONE simple adenoma removed. Please call pt. Her colon and RECTAL biopsies are normal. HER stomach Bx shows gastritis. HER THYROID LEVELS ARE NORMAL. HER CHEST XRAY SHOWS A COMPRESSION FRACTURE DUE TO THIN BONES. NO SOURCE FOR HER WEIGHT LOSS HAS BEEN IDENTIFIED. WE WILL REFER HER TO ENDOCRINOLOGY TO MANAGE HER OSTEOPOROSIS, weight loss, loss of hair, poor nail growth.     SHE NEEDS A HYDROGEN BREATH TEST TO COMPLETE THE WORKUP FOR HER INTERMITTENT LOOSE STOOLS AND WEIGHT LOSS.  NO VOLTAREN, ASPIRIN, BC/GOODY POWDERS, IBUPROFEN/MOTRIN, OR NAPROXEN/ALEVE BECAUSE THEY MAKE CROHN'S FLARES MORE LIKELY. RESUME MERCAPTOPURINE DAILY.  CONTINUE OMEPRAZOLE EVERY MORNING. DRINK WATER TO KEEP YOUR URINE LIGHT YELLOW. FOLLOW A HIGH FIBER DIET. AVOID ITEMS THAT CAUSE BLOATING.   FOLLOW UP IN 3 MOS E30 WEIGHT LOSS LOOSE STOOLS.

## 2016-02-15 ENCOUNTER — Other Ambulatory Visit: Payer: Self-pay

## 2016-02-15 DIAGNOSIS — R634 Abnormal weight loss: Secondary | ICD-10-CM

## 2016-02-15 DIAGNOSIS — R197 Diarrhea, unspecified: Secondary | ICD-10-CM

## 2016-02-15 NOTE — Telephone Encounter (Signed)
Ov made

## 2016-02-15 NOTE — Telephone Encounter (Signed)
Pt is set up for HBT and instructions are in the mail. Referral has been made also

## 2016-02-15 NOTE — Telephone Encounter (Signed)
Pt is aware of results. Ok to refer to Endocrinology and Ok to schedule the HBT.

## 2016-02-22 NOTE — Progress Notes (Signed)
Dr. Oneida Alar, patient had lipase over 10,000 and stranding adjacent to pancreas on CT at outside facility back in 09/2015. No gallstones on u/s. 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

## 2016-02-23 NOTE — Telephone Encounter (Signed)
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  Start Creon 36,000 units, take two with meals and 1 with snacks (8 per day), #240, 3 refills.

## 2016-02-23 NOTE — Progress Notes (Signed)
See telephone note.

## 2016-02-23 NOTE — Telephone Encounter (Signed)
LIPASE 10K AT OUTSIDE FACILITY. ADD PANCREATIC ENZYMES AND COMPLETE MRCP.

## 2016-02-24 ENCOUNTER — Ambulatory Visit (HOSPITAL_COMMUNITY)
Admission: RE | Admit: 2016-02-24 | Discharge: 2016-02-24 | Disposition: A | Discharge status: Home / Self Care | Payer: Medicare Other | Source: Ambulatory Visit | Attending: Gastroenterology | Admitting: Gastroenterology

## 2016-02-24 ENCOUNTER — Encounter (HOSPITAL_COMMUNITY): Payer: Self-pay | Admitting: *Deleted

## 2016-02-24 ENCOUNTER — Encounter (HOSPITAL_COMMUNITY): Payer: Self-pay

## 2016-02-24 ENCOUNTER — Encounter (HOSPITAL_COMMUNITY)
Admission: RE | Disposition: A | Discharge status: Home / Self Care | Payer: Self-pay | Source: Ambulatory Visit | Attending: Gastroenterology

## 2016-02-24 ENCOUNTER — Telehealth: Payer: Self-pay | Admitting: Gastroenterology

## 2016-02-24 DIAGNOSIS — K6389 Other specified diseases of intestine: Secondary | ICD-10-CM | POA: Insufficient documentation

## 2016-02-24 DIAGNOSIS — R195 Other fecal abnormalities: Secondary | ICD-10-CM | POA: Diagnosis not present

## 2016-02-24 DIAGNOSIS — R634 Abnormal weight loss: Secondary | ICD-10-CM | POA: Insufficient documentation

## 2016-02-24 DIAGNOSIS — R197 Diarrhea, unspecified: Secondary | ICD-10-CM | POA: Diagnosis not present

## 2016-02-24 HISTORY — PX: BACTERIAL OVERGROWTH TEST: SHX5739

## 2016-02-24 LAB — GLUCOSE, CAPILLARY: Glucose-Capillary: 121 mg/dL — ABNORMAL HIGH (ref 65–99)

## 2016-02-24 SURGERY — BREATH TEST, FOR INTESTINAL BACTERIAL OVERGROWTH

## 2016-02-24 MED ORDER — LACTULOSE 10 GM/15ML PO SOLN
ORAL | Status: AC
  Administered 2016-02-24: 30 g
  Filled 2016-02-24: qty 60

## 2016-02-24 NOTE — Progress Notes (Signed)
No beans, bran or high fiber cereal the day before the procedure? no NPO except for water 12 hours before procedure? no No smoking, sleeping or vigorous exercising for at least 30 before procedure? no Recent antibiotic use and/or diarrhea? no   If yes, physician notified.  Time Baseline  30 mins 45 mins 60 mins 75 mins 90 mins 105 mins 120 mins 135 mins 150 mins 165 mins 180 mins  H2-ppm   4     8    28    32     25   44     65    58    58     50     80    69

## 2016-02-24 NOTE — Telephone Encounter (Signed)
Tammy from Endo called to say that patient has completed her breath test. [phone]

## 2016-02-24 NOTE — Telephone Encounter (Signed)
Tried to call pt. Many rings and no answer.

## 2016-02-25 ENCOUNTER — Telehealth: Payer: Self-pay | Admitting: Gastroenterology

## 2016-02-25 ENCOUNTER — Other Ambulatory Visit: Payer: Self-pay

## 2016-02-25 DIAGNOSIS — K861 Other chronic pancreatitis: Secondary | ICD-10-CM

## 2016-02-25 MED ORDER — AMOXICILLIN-POT CLAVULANATE 500-125 MG PO TABS: ORAL_TABLET | ORAL | 0 refills | Status: DC

## 2016-02-25 NOTE — Telephone Encounter (Signed)
NO PA is needed for the MRCP/MRI  Ref# [id number]

## 2016-02-25 NOTE — Telephone Encounter (Signed)
Tried to call. Many rings and no answer. Mailing a letter to call.

## 2016-02-25 NOTE — Telephone Encounter (Signed)
Mailing a letter to call.

## 2016-02-25 NOTE — Procedures (Signed)
PRE-OPERATIVE DIAGNOSIS: WEIGHT LOSS, LOOSE STOOLS  POST-OPERATIVE DIAGNOSIS:  SIBO  PROCEDURE:  Procedure(s): HYDROGEN BREATH TEST  SURGEON:  Surgeon(s): Dorothyann Peng, MD  MEDICATIONS USED:  LACTULOSE  FINDINGS: BREATHALYZER- 0 MIN(4 PPM), FIRST PEAK (32 PPM, 60 MINS), TROUGH (44 PPM 75 MINS), SECOND PEAK (65 PPM, 105 MINS), SECOND TROUGH(50 PPM, 150 MINS)  DIAGNOSIS: SMALL BOWEL BACTERIAL OVERGROWTH.   PLAN OF CARE:  1. AUGMENTIN 500 MG BID FOR 5 DAYS. 2. CALL IN ONE MONTH IF LOOSE STOOLS NOT IMPROVED. 3. Follow up in NOV 2017

## 2016-02-25 NOTE — Telephone Encounter (Signed)
PLEASE CALL PT. HER HYDROGEN BREATH TEST SHOWS SMALL BOWEL BACTERIAL OVERGROWTH. SHE SHOULD HAVE LESS DIARRHEA AFTER TAKING ANTIBIOTICS.   1. START AUGMENTIN 500 MG BID FOR 5 DAYS. IT MAY CAUSE ABDOMINAL PAIN, NAUSEA, VOMITING, RASH, OR EXPLOSIVE DIARRHEA. 2. PLEASE CALL IN ONE MONTH IF LOOSE STOOLS ARE NOT IMPROVED. 3. Follow up in NOV 2017 E30 SIBO/WEIGHT LOSS.

## 2016-02-25 NOTE — Telephone Encounter (Signed)
Pt is set up for MRCP/MRI on 03/09/16 @ 8:00. Tried to call with no answer. I have the appointment letter going out in the mail for her.

## 2016-02-25 NOTE — Telephone Encounter (Signed)
OV made °

## 2016-02-25 NOTE — Telephone Encounter (Signed)
Tried to call. Many rings and no answer.

## 2016-02-26 NOTE — Telephone Encounter (Signed)
REVIEWED. READ AUG 10.

## 2016-03-01 ENCOUNTER — Encounter (HOSPITAL_COMMUNITY): Payer: Self-pay | Admitting: Gastroenterology

## 2016-03-07 NOTE — Telephone Encounter (Signed)
PT called today to cancel/reschedule MRI.  She is aware of test Hydrogen test results and has picked up the Augumentin. However, she was recently on prednisone from another doctor so she waited til she finished that and will be doing the antibiotic this week.

## 2016-03-07 NOTE — Telephone Encounter (Signed)
Pt has the phone number to reschedule her MRI

## 2016-03-07 NOTE — Telephone Encounter (Signed)
REVIEWED-NO ADDITIONAL RECOMMENDATIONS. 

## 2016-03-09 ENCOUNTER — Ambulatory Visit (HOSPITAL_COMMUNITY): Payer: Medicare (Managed Care) | Attending: Gastroenterology

## 2016-03-16 ENCOUNTER — Telehealth: Payer: Self-pay

## 2016-03-16 NOTE — Telephone Encounter (Signed)
Please see note by Neil Crouch, PA on 02/23/2016.  PT needs Creon 36,000 units #240 and take 2 with meals and one with snacks ( 8 per day).  Can call to pharmacy when pt is aware. Called, many rings and no answer.

## 2016-03-16 NOTE — Telephone Encounter (Signed)
I called the prescription to [facility] in [place] and [facility] for the Creon prescription.

## 2016-03-23 ENCOUNTER — Ambulatory Visit (HOSPITAL_COMMUNITY)
Admission: RE | Admit: 2016-03-23 | Discharge: 2016-03-23 | Disposition: A | Discharge status: Home / Self Care | Payer: Medicare Other | Source: Ambulatory Visit | Attending: Gastroenterology | Admitting: Gastroenterology

## 2016-03-23 ENCOUNTER — Other Ambulatory Visit: Payer: Self-pay | Admitting: Gastroenterology

## 2016-03-23 DIAGNOSIS — K861 Other chronic pancreatitis: Secondary | ICD-10-CM | POA: Insufficient documentation

## 2016-03-23 LAB — POCT I-STAT CREATININE: CREATININE: 0.6 mg/dL (ref 0.44–1.00)

## 2016-03-23 MED ORDER — GADOBENATE DIMEGLUMINE 529 MG/ML IV SOLN
10.0000 mL | Freq: Once | INTRAVENOUS | Status: AC | PRN
  Administered 2016-03-23: 9 mL via INTRAVENOUS

## 2016-03-24 NOTE — Progress Notes (Signed)
Please let patient know that he pancreas appears normal. GB appears normal. No explanation for pancreatitis earlier this year.   Please keep OV next month as schedule.  Call if recurrent abdominal pain. Call if persistent weight loss.   FYI Dr. Oneida Alar for any further input if needed.

## 2016-03-24 NOTE — Progress Notes (Signed)
Tried to call pt and phone not working at this time. Mailing a letter to call.

## 2016-03-27 ENCOUNTER — Encounter (HOSPITAL_COMMUNITY): Payer: Self-pay | Admitting: Emergency Medicine

## 2016-03-27 ENCOUNTER — Inpatient Hospital Stay (HOSPITAL_COMMUNITY)
Admission: EM | Admit: 2016-03-27 | Discharge: 2016-03-30 | DRG: 444 | Disposition: A | Discharge status: Home / Self Care | Payer: Medicare Other | Attending: Internal Medicine | Admitting: Internal Medicine

## 2016-03-27 ENCOUNTER — Emergency Department (HOSPITAL_COMMUNITY): Payer: Medicare Other

## 2016-03-27 DIAGNOSIS — Z7984 Long term (current) use of oral hypoglycemic drugs: Secondary | ICD-10-CM

## 2016-03-27 DIAGNOSIS — N179 Acute kidney failure, unspecified: Secondary | ICD-10-CM | POA: Diagnosis present

## 2016-03-27 DIAGNOSIS — R1013 Epigastric pain: Secondary | ICD-10-CM | POA: Diagnosis not present

## 2016-03-27 DIAGNOSIS — K5090 Crohn's disease, unspecified, without complications: Secondary | ICD-10-CM | POA: Diagnosis present

## 2016-03-27 DIAGNOSIS — D72819 Decreased white blood cell count, unspecified: Secondary | ICD-10-CM

## 2016-03-27 DIAGNOSIS — K859 Acute pancreatitis without necrosis or infection, unspecified: Secondary | ICD-10-CM | POA: Diagnosis present

## 2016-03-27 DIAGNOSIS — E119 Type 2 diabetes mellitus without complications: Secondary | ICD-10-CM | POA: Diagnosis present

## 2016-03-27 DIAGNOSIS — K219 Gastro-esophageal reflux disease without esophagitis: Secondary | ICD-10-CM | POA: Diagnosis present

## 2016-03-27 DIAGNOSIS — K8000 Calculus of gallbladder with acute cholecystitis without obstruction: Principal | ICD-10-CM | POA: Diagnosis present

## 2016-03-27 DIAGNOSIS — I10 Essential (primary) hypertension: Secondary | ICD-10-CM | POA: Diagnosis present

## 2016-03-27 DIAGNOSIS — R1110 Vomiting, unspecified: Secondary | ICD-10-CM

## 2016-03-27 DIAGNOSIS — K819 Cholecystitis, unspecified: Secondary | ICD-10-CM | POA: Diagnosis not present

## 2016-03-27 DIAGNOSIS — K858 Other acute pancreatitis without necrosis or infection: Secondary | ICD-10-CM | POA: Diagnosis not present

## 2016-03-27 DIAGNOSIS — Z800 Family history of malignant neoplasm of digestive organs: Secondary | ICD-10-CM | POA: Diagnosis not present

## 2016-03-27 DIAGNOSIS — K50819 Crohn's disease of both small and large intestine with unspecified complications: Secondary | ICD-10-CM | POA: Diagnosis not present

## 2016-03-27 DIAGNOSIS — D649 Anemia, unspecified: Secondary | ICD-10-CM | POA: Diagnosis present

## 2016-03-27 DIAGNOSIS — N289 Disorder of kidney and ureter, unspecified: Secondary | ICD-10-CM | POA: Diagnosis not present

## 2016-03-27 DIAGNOSIS — E785 Hyperlipidemia, unspecified: Secondary | ICD-10-CM | POA: Diagnosis present

## 2016-03-27 DIAGNOSIS — M810 Age-related osteoporosis without current pathological fracture: Secondary | ICD-10-CM | POA: Diagnosis present

## 2016-03-27 DIAGNOSIS — K810 Acute cholecystitis: Secondary | ICD-10-CM | POA: Diagnosis present

## 2016-03-27 DIAGNOSIS — R109 Unspecified abdominal pain: Secondary | ICD-10-CM

## 2016-03-27 DIAGNOSIS — R1011 Right upper quadrant pain: Secondary | ICD-10-CM | POA: Diagnosis not present

## 2016-03-27 LAB — COMPREHENSIVE METABOLIC PANEL
ALBUMIN: 3.3 g/dL — AB (ref 3.5–5.0)
ALT: 81 U/L — ABNORMAL HIGH (ref 14–54)
ANION GAP: 20 — AB (ref 5–15)
AST: 155 U/L — ABNORMAL HIGH (ref 15–41)
Alkaline Phosphatase: 53 U/L (ref 38–126)
BUN: 40 mg/dL — ABNORMAL HIGH (ref 6–20)
CO2: 24 mmol/L (ref 22–32)
Calcium: 9.2 mg/dL (ref 8.9–10.3)
Chloride: 90 mmol/L — ABNORMAL LOW (ref 101–111)
Creatinine, Ser: 1.71 mg/dL — ABNORMAL HIGH (ref 0.44–1.00)
GFR calc Af Amer: 33 mL/min — ABNORMAL LOW (ref >60)
GFR calc non Af Amer: 28 mL/min — ABNORMAL LOW (ref >60)
GLUCOSE: 167 mg/dL — AB (ref 65–99)
POTASSIUM: 5 mmol/L (ref 3.5–5.1)
SODIUM: 134 mmol/L — AB (ref 135–145)
Total Bilirubin: 1.7 mg/dL — ABNORMAL HIGH (ref 0.3–1.2)
Total Protein: 7 g/dL (ref 6.5–8.1)

## 2016-03-27 LAB — CBC WITH DIFFERENTIAL/PLATELET
BASOS PCT: 0 %
Basophils Absolute: 0 10*3/uL (ref 0.0–0.1)
EOS ABS: 0 10*3/uL (ref 0.0–0.7)
Eosinophils Relative: 2 %
HEMATOCRIT: 29.4 % — AB (ref 36.0–46.0)
Hemoglobin: 9.2 g/dL — ABNORMAL LOW (ref 12.0–15.0)
LYMPHS ABS: 0.4 10*3/uL — AB (ref 0.7–4.0)
LYMPHS PCT: 31 %
MCH: 33.9 pg (ref 26.0–34.0)
MCHC: 31.3 g/dL (ref 30.0–36.0)
MCV: 108.5 fL — ABNORMAL HIGH (ref 78.0–100.0)
MONO ABS: 0 10*3/uL — AB (ref 0.1–1.0)
Monocytes Relative: 1 %
NEUTROS ABS: 1 10*3/uL — AB (ref 1.7–7.7)
Neutrophils Relative %: 66 %
Platelets: 160 10*3/uL (ref 150–400)
RBC: 2.71 MIL/uL — ABNORMAL LOW (ref 3.87–5.11)
RDW: 17.8 % — AB (ref 11.5–15.5)
WBC: 1.4 10*3/uL — CL (ref 4.0–10.5)

## 2016-03-27 LAB — URINALYSIS, ROUTINE W REFLEX MICROSCOPIC
Glucose, UA: NEGATIVE mg/dL
Ketones, ur: 15 mg/dL — AB
LEUKOCYTES UA: NEGATIVE
NITRITE: NEGATIVE
PH: 5.5 (ref 5.0–8.0)

## 2016-03-27 LAB — URINE MICROSCOPIC-ADD ON

## 2016-03-27 LAB — LIPASE, BLOOD: Lipase: 374 U/L — ABNORMAL HIGH (ref 11–51)

## 2016-03-27 MED ORDER — MORPHINE SULFATE (PF) 4 MG/ML IV SOLN
4.0000 mg | Freq: Once | INTRAVENOUS | Status: AC
Start: 2016-03-27 — End: 2016-03-27
  Administered 2016-03-27: 4 mg via INTRAVENOUS
  Filled 2016-03-27: qty 1

## 2016-03-27 MED ORDER — MERCAPTOPURINE 50 MG PO TABS
75.0000 mg | ORAL_TABLET | Freq: Every day | ORAL | Status: DC
  Administered 2016-03-28 – 2016-03-29 (×2): 75 mg via ORAL
  Filled 2016-03-27 (×4): qty 2

## 2016-03-27 MED ORDER — PIPERACILLIN-TAZOBACTAM 3.375 G IVPB 30 MIN
3.3750 g | Freq: Once | INTRAVENOUS | Status: AC
  Administered 2016-03-27: 3.375 g via INTRAVENOUS
  Filled 2016-03-27: qty 50

## 2016-03-27 MED ORDER — MORPHINE SULFATE (PF) 2 MG/ML IV SOLN
2.0000 mg | INTRAVENOUS | Status: DC | PRN
Start: 2016-03-27 — End: 2016-03-30
  Administered 2016-03-27 – 2016-03-30 (×4): 2 mg via INTRAVENOUS
  Filled 2016-03-27 (×5): qty 1

## 2016-03-27 MED ORDER — AMLODIPINE BESYLATE 5 MG PO TABS
10.0000 mg | ORAL_TABLET | Freq: Every day | ORAL | Status: DC
  Administered 2016-03-28 – 2016-03-30 (×3): 10 mg via ORAL
  Filled 2016-03-27 (×3): qty 2

## 2016-03-27 MED ORDER — PIPERACILLIN-TAZOBACTAM IN DEX 2-0.25 GM/50ML IV SOLN
2.2500 g | Freq: Four times a day (QID) | INTRAVENOUS | Status: DC
  Administered 2016-03-27 – 2016-03-28 (×2): 2.25 g via INTRAVENOUS
  Filled 2016-03-27 (×3): qty 50

## 2016-03-27 MED ORDER — POLYETHYLENE GLYCOL 3350 17 G PO PACK: 17.0000 g | PACK | Freq: Every day | ORAL | Status: DC | PRN

## 2016-03-27 MED ORDER — SODIUM CHLORIDE 0.9 % IV SOLN: INTRAVENOUS | Status: AC

## 2016-03-27 MED ORDER — SODIUM CHLORIDE 0.9 % IV SOLN
INTRAVENOUS | Status: DC
Start: 2016-03-27 — End: 2016-03-30
  Administered 2016-03-27 – 2016-03-29 (×4): via INTRAVENOUS

## 2016-03-27 MED ORDER — SODIUM CHLORIDE 0.9% FLUSH
3.0000 mL | Freq: Two times a day (BID) | INTRAVENOUS | Status: DC
  Administered 2016-03-28: 10 mL via INTRAVENOUS
  Administered 2016-03-29: 3 mL via INTRAVENOUS

## 2016-03-27 MED ORDER — SODIUM CHLORIDE 0.9 % IV BOLUS (SEPSIS)
500.0000 mL | Freq: Once | INTRAVENOUS | Status: AC
  Administered 2016-03-27: 500 mL via INTRAVENOUS

## 2016-03-27 MED ORDER — ONDANSETRON HCL 4 MG/2ML IJ SOLN
4.0000 mg | Freq: Once | INTRAMUSCULAR | Status: AC
  Administered 2016-03-27: 4 mg via INTRAVENOUS
  Filled 2016-03-27: qty 2

## 2016-03-27 MED ORDER — IOPAMIDOL (ISOVUE-300) INJECTION 61%
INTRAVENOUS | Status: AC
  Filled 2016-03-27: qty 30

## 2016-03-27 MED ORDER — METOPROLOL SUCCINATE ER 25 MG PO TB24
25.0000 mg | ORAL_TABLET | Freq: Every day | ORAL | Status: DC
  Administered 2016-03-28 – 2016-03-30 (×3): 25 mg via ORAL
  Filled 2016-03-27 (×3): qty 1

## 2016-03-27 MED ORDER — PIPERACILLIN-TAZOBACTAM IN DEX 2-0.25 GM/50ML IV SOLN
INTRAVENOUS | Status: AC
  Filled 2016-03-27: qty 100

## 2016-03-27 MED ORDER — PANCRELIPASE (LIP-PROT-AMYL) 12000-38000 UNITS PO CPEP
72000.0000 [IU] | ORAL_CAPSULE | Freq: Four times a day (QID) | ORAL | Status: DC
  Administered 2016-03-28 – 2016-03-30 (×8): 72000 [IU] via ORAL
  Filled 2016-03-27 (×8): qty 6

## 2016-03-27 NOTE — H&P (Signed)
History and Physical    Tamara Todd XVQ:[id number] DOB: [birth year] DOA: 03/27/2016  PCP: [name], NP Patient coming from: Home  Chief Complaint: Abdominal pain, vomitig  HPI: Tamara Todd is a 75 y.o. female with medical history significant of Crohn's Disease, Arthritis, Hypertension, and DM Type II who presented to the ED with abdominal pain and just "not feeling right" for the past 3-4 days.  She reports symptoms began about 2-3 weeks ago.  Symptoms of abdominal pain, nausea increased in intensity 3-4 days ago though.  Last night she voices her pain intensified as did her nausea and ultimately she vomited 2 times.  She came in for evaluation secondary to her history of SBO and crohn's disease.  Pain is worst in the left upper quadrant but radiates across the abdomen to the epigastrium and right upper quadrant.  Denies fever at home.  Last bowel movement was yesterday.  Consistency was that of diarrhea but that is normal for patient.  Denies sick contacts, denies recent travel, denies chest pain, chest pressure, shortness of breath.  Has been eating and drinking significantly less than her normal for the past 3-4 days.  ED Course: In the ED patient was seen and evaluated.  Found to have an elevated creatinine of 1.71.  CT scan of the abdomen Slight dilatation of the gallbladder, no definite intraluminal stones or wall thickening. A small amount of soft tissue thickening/stranding is present in the right upper quadrant near the hepatic margin and adjacent to the gallbladder fundus, this is nonspecific. If signs or symptoms are referable to the gallbladder, further evaluation with ultrasound may be obtained. Common bile duct appears slightly enlarged ; pancreatic duct slightly more prominent compared to recent MRI. Suggest correlation with laboratory values. Patchy wall thickening involving the rectosigmoid colon, could relate to history of Crohn's disease. No evidence for perforation or  abscess.  Dr. Arnoldo Morale of general surgery was consulted for evaluation of cholecystitis.  Hospitalist was consulted for admission.  Review of Systems: As per HPI otherwise 10 point review of systems negative.    Past Medical History:  Diagnosis Date  . Crohn's disease ([place])    used to be followed by Dr. Algis Greenhouse. diagnosis in her 46s. multiple surgeries. remicade allergy.  . Diabetes ([place])   . GERD (gastroesophageal reflux disease)   . HTN (hypertension)   . Hyperlipidemia   . Hypertension   . Osteoporosis       . Pancreatitis    09/2015    Past Surgical History:  Procedure Laterality Date  . BACTERIAL OVERGROWTH TEST N/A 02/24/2016   Procedure: BACTERIAL OVERGROWTH TEST;  Surgeon: Danie Binder, MD;  Location: [facility];  Service: Endoscopy;  Laterality: N/A;  830   . BIOPSY  02/10/2016   Procedure: BIOPSY;  Surgeon: Danie Binder, MD;  Location: [facility];  Service: Endoscopy;;  Random colon biopsies Gastric biopsies for h pylori gastric polyp biopsies  . BOWEL RESECTION     TWICE for stricture, including right colectomy with terminal ileal resection  . COLONOSCOPY    . COLONOSCOPY N/A 02/10/2016   Procedure: COLONOSCOPY;  Surgeon: Danie Binder, MD;  Location: [facility];  Service: Endoscopy;  Laterality: N/A;  9:30 AM - ileocolonoscopy  . ESOPHAGOGASTRODUODENOSCOPY N/A 02/10/2016   Procedure: ESOPHAGOGASTRODUODENOSCOPY (EGD);  Surgeon: Danie Binder, MD;  Location: [facility];  Service: Endoscopy;  Laterality: N/A;  . PARTIAL HYSTERECTOMY    . POLYPECTOMY  02/10/2016  Procedure: POLYPECTOMY;  Surgeon: Danie Binder, MD;  Location: [facility];  Service: Endoscopy;;  Rectal polyps x 2 removed 1 by hot snare and number 2 via cold forceps  . 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      reports that she has never smoked. She has never used smokeless tobacco. She reports that she does not drink  alcohol or use drugs.  Allergies  Allergen Reactions  . Lisinopril     Cough   . Remicade [Infliximab] Other (See Comments)    Couldn't breath  . Codeine Rash    Family History  Problem Relation Age of Onset  . Colon cancer Father     30s  . Inflammatory bowel disease Sister     ???     Prior to Admission medications   Medication Sig Start Date End Date Taking? Authorizing Provider  acetaminophen (TYLENOL) 650 MG CR tablet Take 650 mg by mouth 2 (two) times daily.   Yes Historical Provider, MD  amLODipine (NORVASC) 10 MG tablet Take 10 mg by mouth daily. 11/17/15  Yes Historical Provider, MD  calcium carbonate (OS-CAL - DOSED IN MG OF ELEMENTAL CALCIUM) 1250 (500 Ca) MG tablet Take 1 tablet by mouth 2 (two) times daily with a meal.   Yes Historical Provider, MD  cyanocobalamin (,VITAMIN B-12,) 1000 MCG/ML injection Inject 1 mL into the muscle every 14 (fourteen) days. 01/08/16  Yes Historical Provider, MD  diclofenac (VOLTAREN) 75 MG EC tablet Take 75 mg by mouth daily.   Yes Historical Provider, MD  FeFum-FePo-FA-B Cmp-C-Zn-Mn-Cu (SE-TAN PLUS) 162-115.2-1 MG CAPS Take 1 capsule by mouth daily.   Yes Historical Provider, MD  lipase/protease/amylase (CREON) 36000 UNITS CPEP capsule Take 72,000 Units by mouth 4 (four) times daily. With meals and snack.   Yes Historical Provider, MD  losartan-hydrochlorothiazide (HYZAAR) 50-12.5 MG tablet Take 1 tablet by mouth daily. 11/13/15  Yes Historical Provider, MD  magnesium oxide (MAG-OX) 400 (241.3 Mg) MG tablet Take 1 tablet by mouth 2 (two) times daily with a meal. 10/11/15  Yes Historical Provider, MD  mercaptopurine (PURINETHOL) 50 MG tablet Give on an empty stomach 1 hour before or 2 hours after meals. Caution: Chemotherapy. Patient taking differently: Take 75 mg by mouth daily. Give on an empty stomach 1 hour before or 2 hours after meals. Caution: Chemotherapy. 02/10/16  Yes Danie Binder, MD  metFORMIN (GLUCOPHAGE) 500 MG tablet TAKE 1  TABLET BY MOUTH TWICE DAILY. 10/24/15  Yes Historical Provider, MD  metoprolol succinate (TOPROL-XL) 25 MG 24 hr tablet Take 25 mg by mouth daily. 10/09/15  Yes Historical Provider, MD  omeprazole (PRILOSEC) 20 MG capsule Take 20 mg by mouth daily. 10/25/15  Yes Historical Provider, MD  Vitamin D, Ergocalciferol, (DRISDOL) 50000 units CAPS capsule Take 50,000 Units by mouth once a week. Takes on Mondays. 09/24/15  Yes Historical Provider, MD  amoxicillin-clavulanate (AUGMENTIN) 500-125 MG tablet 1 PO BID FOR 5 DAYS Patient not taking: Reported on 03/27/2016 02/25/16   Danie Binder, MD    Physical Exam: Vitals:   03/27/16 1300 03/27/16 1330 03/27/16 1343 03/27/16 1357  BP: 113/62 114/63  124/85  Pulse: 84 87  86  Resp:   18 20  Temp:   98.2 F (36.8 C) 97.9 F (36.6 C)  TempSrc:   Oral Oral  SpO2: 99% 100%  95%  Weight:    49.3 kg (108 lb 11.2 oz)  Height:    4' 11"  (1.499 m)      Constitutional: NAD, calm, comfortable Vitals:   03/27/16 1300 03/27/16 1330 03/27/16 1343 03/27/16 1357  BP: 113/62 114/63  124/85  Pulse: 84 87  86  Resp:   18 20  Temp:   98.2 F (36.8 C) 97.9 F (36.6 C)  TempSrc:   Oral Oral  SpO2: 99% 100%  95%  Weight:    49.3 kg (108 lb 11.2 oz)  Height:    4' 11"  (1.499 m)   Eyes: PERRL, lids and conjunctivae normal ENMT: Mucous membranes are moist. Posterior pharynx clear of any exudate or lesions.Normal dentition.  Neck: normal, supple, no masses, no thyromegaly Respiratory: clear to auscultation bilaterally, no wheezing, no crackles. Normal respiratory effort. No accessory muscle use.  Cardiovascular: Regular rate and rhythm, II/VI systolic murmur heard best at aortic area with radiation to carotids, no rubs / gallops. No extremity edema. 2+ pedal pulses. No carotid bruits.  Abdomen: tenderness in right upper quadrant and epigastrium, no masses palpated. No hepatosplenomegaly. Bowel sounds positive.  Musculoskeletal: no clubbing / cyanosis. No joint  deformity upper and lower extremities. Good ROM, no contractures. Normal muscle tone.  Skin: no rashes, lesions, ulcers. No induration Neurologic: CN 2-12 grossly intact. Sensation intact, DTR normal. Strength 5/5 in all 4.  Psychiatric: Normal judgment and insight. Alert and oriented x 3. Normal mood.    Labs on Admission: I have personally reviewed following labs and imaging studies  CBC:  Recent Labs Lab 03/27/16 0830  WBC 1.4*  NEUTROABS 1.0*  HGB 9.2*  HCT 29.4*  MCV 108.5*  PLT 619   Basic Metabolic Panel:  Recent Labs Lab 03/23/16 1400 03/27/16 0830  NA  --  134*  K  --  5.0  CL  --  90*  CO2  --  24  GLUCOSE  --  167*  BUN  --  40*  CREATININE 0.60 1.71*  CALCIUM  --  9.2   GFR: Estimated Creatinine Clearance: 19.7 mL/min (by C-G formula based on SCr of 1.71 mg/dL). Liver Function Tests:  Recent Labs Lab 03/27/16 0830  AST 155*  ALT 81*  ALKPHOS 53  BILITOT 1.7*  PROT 7.0  ALBUMIN 3.3*    Recent Labs Lab 03/27/16 0830  LIPASE 374*   No results for input(s): AMMONIA in the last 168 hours. Coagulation Profile: No results for input(s): INR, PROTIME in the last 168 hours. Cardiac Enzymes: No results for input(s): CKTOTAL, CKMB, CKMBINDEX, TROPONINI in the last 168 hours. BNP (last 3 results) No results for input(s): PROBNP in the last 8760 hours. HbA1C: No results for input(s): HGBA1C in the last 72 hours. CBG: No results for input(s): GLUCAP in the last 168 hours. Lipid Profile: No results for input(s): CHOL, HDL, LDLCALC, TRIG, CHOLHDL, LDLDIRECT in the last 72 hours. Thyroid Function Tests: No results for input(s): TSH, T4TOTAL, FREET4, T3FREE, THYROIDAB in the last 72 hours. Anemia Panel: No results for input(s): VITAMINB12, FOLATE, FERRITIN, TIBC, IRON, RETICCTPCT in the last 72 hours. Urine analysis:    Component Value Date/Time   COLORURINE YELLOW 03/27/2016 1132   APPEARANCEUR CLEAR 03/27/2016 1132   LABSPEC >1.030 (H)  03/27/2016 1132   PHURINE 5.5 03/27/2016 1132   GLUCOSEU NEGATIVE 03/27/2016 1132   HGBUR MODERATE (A) 03/27/2016 1132   BILIRUBINUR SMALL (A) 03/27/2016 1132   KETONESUR 15 (A) 03/27/2016 1132   PROTEINUR TRACE (A) 03/27/2016 1132   NITRITE NEGATIVE 03/27/2016 1132   LEUKOCYTESUR NEGATIVE  03/27/2016 1132   Sepsis Labs: !!!!!!!!!!!!!!!!!!!!!!!!!!!!!!!!!!!!!!!!!!!! @LABRCNTIP (procalcitonin:4,lacticidven:4) )No results found for this or any previous visit (from the past 240 hour(s)).   Radiological Exams on Admission: Ct Abdomen Pelvis Wo Contrast  Result Date: 03/27/2016 CLINICAL DATA:  Intermittent abdominal pain and vomiting for 2 weeks. History of Crohn's disease and pancreatitis. History of bowel resection and rectovaginal fistula closure. EXAM: CT ABDOMEN AND PELVIS WITHOUT CONTRAST TECHNIQUE: Multidetector CT imaging of the abdomen and pelvis was performed following the standard protocol without IV contrast. COMPARISON:  MRI abdomen 03/23/2016 FINDINGS: Lower chest: Patchy dependent atelectasis posteriorly within the lung bases. Tiny parenchymal calcifications in the posterior right lung base. Mild right middle and lower lobe subpleural interstitial opacities could relate to mild fibrosis. No acute consolidation or effusion. Heart size is slightly enlarged. Hepatobiliary: Liver size upper normal at 17 cm. Noncontrast appearance of the liver otherwise unremarkable. Gallbladder slightly prominent but no definite intraluminal stones are visualized. No wall thickening. Common bile duct is slightly tortuous and mildly prominent, measuring up to 8-9 mm; at the head of the pancreas, this measures 6 mm. It is grossly unchanged compared to recent MRI. Pancreatic duct at the head neck and proximal body appears slightly increased in size compared to MRI, it measures approximately 3 mm in diameter. No definite calcified stones are seen in the region of the common bile duct. Pancreas: No peripancreatic  fluid collections. Pancreatic duct mildly prominent, measuring up to 3 mm. Spleen: Grossly normal non contrasted appearance. Adrenals/Urinary Tract: Adrenal glands within normal limits. No calcified stones or hydronephrosis involving the kidneys. 2.2 cm low-density lesion in the mid right kidney, corresponding to the cyst described on MRI. Additional 9 mm hypodense lesion midpole right kidney. There is a 2.4 cm low-density lesion in the mid left kidney, corresponding to the cyst on MRI. Lobulated exophytic 3 cm hypodense lesion from the upper pole of left kidney, corresponds to MRI cyst. Ureters are nondilated. The bladder is normal. Stomach/Bowel: There is mild gastric wall thickening with suboptimal intra luminal distention. There is no dilated small bowel. The appendix is not well identified. There are patchy areas of wall thickening involving the rectosigmoid colon. No evidence of a perforation. Few scattered colonic diverticula. Vascular/Lymphatic: Limited without intravenous contrast. Atherosclerotic vascular calcifications of the aorta. Scattered mesenteric and retroperitoneal subcentimeter lymph nodes. Reproductive: Uterus is not identified and presumably absent. Multiple calcified phleboliths. Other: Mild soft tissue stranding is present within the right upper quadrant near the fundus of the gallbladder. Musculoskeletal: Heterogenous attenuation of the bones could relate to a osteopenia. There is evidence of an old left inferior pubic ramus fracture. Mild SI joint sclerosis. IMPRESSION: 1. Slight dilatation of the gallbladder, no definite intraluminal stones or wall thickening. A small amount of soft tissue thickening/stranding is present in the right upper quadrant near the hepatic margin and adjacent to the gallbladder fundus, this is nonspecific. If signs or symptoms are referable to the gallbladder, further evaluation with ultrasound may be obtained. Common bile duct appears slightly enlarged ;  pancreatic duct slightly more prominent compared to recent MRI. Suggest correlation with laboratory values. 2. Patchy wall thickening involving the rectosigmoid colon, could relate to history of Crohn's disease. No evidence for perforation or abscess. 3. Multiple hypodense lesions within the bilateral kidneys, corresponding to previously noted cysts on MRI. 4. Mild gastric wall thickening, likely related to underdistention although mild gastritis not excluded. 5. Old left inferior pubic ramus fracture. Electronically Signed   By: Madie Reno.D.  On: 03/27/2016 11:17    EKG: not done  Assessment/Plan Active Problems:   Crohn's disease ([place])   Cholecystitis   Acute cholecystitis   Renal insufficiency     Cholecystitis - Gen Surgery consulted - Dr. Arnoldo Morale to see patient - patient on Zosyn - continue IVF at 4m/hr  Crohn's Disease - continue patient's home medications of mercaptopurine  Acute Kidney Injury - previous creatinine normal - Creatinine today of 1.71 - will give IVF - redraw BMP in am  Anemia - Seems stable from previous lab draws - will redraw H/H in am - could expect a slight decreased secondary to dilutional effects - etiology related to patients mercaptopurine - recent folate, B12, iron studies all WNL  Leukopenia - likely related to mercaptopurine  HTN - continue Norvasc and metoprolol - hold ARB- HCTZ secondary to AKI   DVT prophylaxis: SCD's Code Status: Full Family Communication: No family present Disposition Plan: Unclear when patient will be ready for discharge; will await recommendations from surgery Consults called: Surgery- Dr. JArnoldo MoraleAdmission status: Inpatient   ANewman PiesMD [facility] Pager [phone] If 7PM-7AM, please contact night-coverage [email or web address] Password [facility] 03/27/2016, 4:46 PM

## 2016-03-27 NOTE — ED Notes (Signed)
Pt back in room from CT 

## 2016-03-27 NOTE — ED Notes (Signed)
Lab at bedside

## 2016-03-27 NOTE — ED Triage Notes (Signed)
Patient brought in via EMS from home. Alert and oriented. Airway patent. Patient c/o abd pain with nausea, vomiting, and diarrhea. Per patient intermittent pain x 2 weeks-progressively getting worse. Patient has hx of Crohns disease.

## 2016-03-27 NOTE — ED Notes (Addendum)
Pt finished with oral contrast. CT aware and reported pt to be scanned at 1015. Pt aware.

## 2016-03-27 NOTE — ED Provider Notes (Signed)
[facility] DEPT Provider Note   CSN: [id number] Arrival date & time: 03/27/16  0737     History   Chief Complaint Chief Complaint  Patient presents with  . Abdominal Pain    HPI Tamara Todd is a 75 y.o. female.  HPI Patient presents with abdominal pain for the past 2 weeks. States this worsened in the last 2 days. She's had multiple episodes of vomiting throughout the night. States she's not had a bowel movement or passed any gas since yesterday. Pain is mostly located in the right side of the abdomen. Denies any urinary symptoms. Has had subjective fevers and chills. Patient's has a history of Crohn's disease and is followed by Dr. Oneida Alar. She states she's had multiple abdominal surgeries in the past and was last hospitalized in March for an control related to her Crohn's disease. She also has had multiple small bowel obstructions. States this feels similar. Past Medical History:  Diagnosis Date  . Crohn's disease ([place])    used to be followed by Dr. Algis Greenhouse. diagnosis in her 54s. multiple surgeries. remicade allergy.  . Diabetes ([place])   . GERD (gastroesophageal reflux disease)   . HTN (hypertension)   . Hyperlipidemia   . Hypertension   . Osteoporosis       . Pancreatitis    09/2015    Patient Active Problem List   Diagnosis Date Noted  . Crohn's disease ([place]) 12/08/2015    Past Surgical History:  Procedure Laterality Date  . BACTERIAL OVERGROWTH TEST N/A 02/24/2016   Procedure: BACTERIAL OVERGROWTH TEST;  Surgeon: Danie Binder, MD;  Location: [facility];  Service: Endoscopy;  Laterality: N/A;  830   . BIOPSY  02/10/2016   Procedure: BIOPSY;  Surgeon: Danie Binder, MD;  Location: [facility];  Service: Endoscopy;;  Random colon biopsies Gastric biopsies for h pylori gastric polyp biopsies  . BOWEL RESECTION     TWICE for stricture, including right colectomy with terminal ileal resection  . COLONOSCOPY    . COLONOSCOPY N/A 02/10/2016   Procedure:  COLONOSCOPY;  Surgeon: Danie Binder, MD;  Location: [facility];  Service: Endoscopy;  Laterality: N/A;  9:30 AM - ileocolonoscopy  . ESOPHAGOGASTRODUODENOSCOPY N/A 02/10/2016   Procedure: ESOPHAGOGASTRODUODENOSCOPY (EGD);  Surgeon: Danie Binder, MD;  Location: [facility];  Service: Endoscopy;  Laterality: N/A;  . PARTIAL HYSTERECTOMY    . POLYPECTOMY  02/10/2016   Procedure: POLYPECTOMY;  Surgeon: Danie Binder, MD;  Location: [facility];  Service: Endoscopy;;  Rectal polyps x 2 removed 1 by hot snare and number 2 via cold forceps  . 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     OB History    No data available       Home Medications    Prior to Admission medications   Medication Sig Start Date End Date Taking? Authorizing Provider  acetaminophen (TYLENOL) 650 MG CR tablet Take 650 mg by mouth 2 (two) times daily.   Yes Historical Provider, MD  amLODipine (NORVASC) 10 MG tablet Take 10 mg by mouth daily. 11/17/15  Yes Historical Provider, MD  calcium carbonate (OS-CAL - DOSED IN MG OF ELEMENTAL CALCIUM) 1250 (500 Ca) MG tablet Take 1 tablet by mouth 2 (two) times daily with a meal.   Yes Historical Provider, MD  cyanocobalamin (,VITAMIN B-12,) 1000 MCG/ML injection Inject 1 mL into the muscle every 14 (fourteen) days. 01/08/16  Yes Historical Provider, MD  diclofenac (VOLTAREN) 75 MG EC tablet Take 75 mg by mouth daily.   Yes Historical Provider, MD  FeFum-FePo-FA-B Cmp-C-Zn-Mn-Cu (SE-TAN PLUS) 162-115.2-1 MG CAPS Take 1 capsule by mouth daily.   Yes Historical Provider, MD  lipase/protease/amylase (CREON) 36000 UNITS CPEP capsule Take 72,000 Units by mouth 4 (four) times daily. With meals and snack.   Yes Historical Provider, MD  losartan-hydrochlorothiazide (HYZAAR) 50-12.5 MG tablet Take 1 tablet by mouth daily. 11/13/15  Yes Historical Provider, MD  magnesium oxide (MAG-OX) 400 (241.3 Mg) MG tablet Take 1 tablet by  mouth 2 (two) times daily with a meal. 10/11/15  Yes Historical Provider, MD  mercaptopurine (PURINETHOL) 50 MG tablet Give on an empty stomach 1 hour before or 2 hours after meals. Caution: Chemotherapy. Patient taking differently: Take 75 mg by mouth daily. Give on an empty stomach 1 hour before or 2 hours after meals. Caution: Chemotherapy. 02/10/16  Yes Danie Binder, MD  metFORMIN (GLUCOPHAGE) 500 MG tablet TAKE 1 TABLET BY MOUTH TWICE DAILY. 10/24/15  Yes Historical Provider, MD  metoprolol succinate (TOPROL-XL) 25 MG 24 hr tablet Take 25 mg by mouth daily. 10/09/15  Yes Historical Provider, MD  omeprazole (PRILOSEC) 20 MG capsule Take 20 mg by mouth daily. 10/25/15  Yes Historical Provider, MD  Vitamin D, Ergocalciferol, (DRISDOL) 50000 units CAPS capsule Take 50,000 Units by mouth once a week. Takes on Mondays. 09/24/15  Yes Historical Provider, MD  amoxicillin-clavulanate (AUGMENTIN) 500-125 MG tablet 1 PO BID FOR 5 DAYS Patient not taking: Reported on 03/27/2016 02/25/16   Danie Binder, MD    Family History Family History  Problem Relation Age of Onset  . Colon cancer Father     62s  . Inflammatory bowel disease Sister     ???    Social History Social History  Substance Use Topics  . Smoking status: Never Smoker  . Smokeless tobacco: Never Used  . Alcohol use No     Allergies   Lisinopril; Remicade [infliximab]; and Codeine   Review of Systems Review of Systems  Constitutional: Positive for chills and fever.  Respiratory: Negative for cough and shortness of breath.   Cardiovascular: Negative for chest pain.  Gastrointestinal: Positive for abdominal pain, nausea and vomiting. Negative for constipation and diarrhea.  Genitourinary: Negative for dysuria, flank pain and frequency.  Musculoskeletal: Negative for back pain, myalgias, neck pain and neck stiffness.  Skin: Negative for rash and wound.  Neurological: Negative for dizziness, weakness, light-headedness, numbness and  headaches.  All other systems reviewed and are negative.    Physical Exam Updated Vital Signs BP 125/60 (BP Location: Left Arm)   Pulse 84   Temp 97.9 F (36.6 C) (Oral)   Resp 18   Ht 4' 11"  (1.499 m)   Wt 109 lb (49.4 kg)   SpO2 100%   BMI 22.02 kg/m   Physical Exam  Constitutional: She is oriented to person, place, and time. She appears well-developed and well-nourished.  HENT:  Head: Normocephalic and atraumatic.  Mouth/Throat: Oropharynx is clear and moist.  Eyes: EOM are normal. Pupils are equal, round, and reactive to light.  Neck: Normal range of motion. Neck supple.  Cardiovascular: Normal rate and regular rhythm.   Pulmonary/Chest: Effort normal and breath sounds normal.  Abdominal: Soft. There is tenderness. There is no rebound and no guarding.  Hyperactive bowel sounds. Diffuse generalized tenderness but worse in the right upper and right lower quadrant. No rebound or guarding.  Musculoskeletal: Normal range of motion. She exhibits no edema or tenderness.  Neurological: She is alert and oriented to person, place, and time.  Skin: Skin is warm and dry. No rash noted. No erythema.  Psychiatric: She has a normal mood and affect. Her behavior is normal.  Nursing note and vitals reviewed.    ED Treatments / Results  Labs (all labs ordered are listed, but only abnormal results are displayed) Labs Reviewed  CBC WITH DIFFERENTIAL/PLATELET - Abnormal; Notable for the following:       Result Value   WBC 1.4 (*)    RBC 2.71 (*)    Hemoglobin 9.2 (*)    HCT 29.4 (*)    MCV 108.5 (*)    RDW 17.8 (*)    Neutro Abs 1.0 (*)    Lymphs Abs 0.4 (*)    Monocytes Absolute 0.0 (*)    All other components within normal limits  COMPREHENSIVE METABOLIC PANEL - Abnormal; Notable for the following:    Sodium 134 (*)    Chloride 90 (*)    Glucose, Bld 167 (*)    BUN 40 (*)    Creatinine, Ser 1.71 (*)    Albumin 3.3 (*)    AST 155 (*)    ALT 81 (*)    Total Bilirubin 1.7  (*)    GFR calc non Af Amer 28 (*)    GFR calc Af Amer 33 (*)    Anion gap 20 (*)    All other components within normal limits  LIPASE, BLOOD - Abnormal; Notable for the following:    Lipase 374 (*)    All other components within normal limits  URINALYSIS, ROUTINE W REFLEX MICROSCOPIC (NOT AT [facility]) - Abnormal; Notable for the following:    Specific Gravity, Urine >1.030 (*)    Hgb urine dipstick MODERATE (*)    Bilirubin Urine SMALL (*)    Ketones, ur 15 (*)    Protein, ur TRACE (*)    All other components within normal limits  URINE MICROSCOPIC-ADD ON - Abnormal; Notable for the following:    Squamous Epithelial / LPF 0-5 (*)    Bacteria, UA FEW (*)    All other components within normal limits    EKG  EKG Interpretation None       Radiology Ct Abdomen Pelvis Wo Contrast  Result Date: 03/27/2016 CLINICAL DATA:  Intermittent abdominal pain and vomiting for 2 weeks. History of Crohn's disease and pancreatitis. History of bowel resection and rectovaginal fistula closure. EXAM: CT ABDOMEN AND PELVIS WITHOUT CONTRAST TECHNIQUE: Multidetector CT imaging of the abdomen and pelvis was performed following the standard protocol without IV contrast. COMPARISON:  MRI abdomen 03/23/2016 FINDINGS: Lower chest: Patchy dependent atelectasis posteriorly within the lung bases. Tiny parenchymal calcifications in the posterior right lung base. Mild right middle and lower lobe subpleural interstitial opacities could relate to mild fibrosis. No acute consolidation or effusion. Heart size is slightly enlarged. Hepatobiliary: Liver size upper normal at 17 cm. Noncontrast appearance of the liver otherwise unremarkable. Gallbladder slightly prominent but no definite intraluminal stones are visualized. No wall thickening. Common bile duct is slightly tortuous and mildly prominent, measuring up to 8-9 mm; at the head of the pancreas, this measures 6 mm. It is grossly unchanged compared to recent MRI. Pancreatic  duct at the head neck and proximal body appears slightly increased in size compared to MRI, it measures approximately 3 mm in diameter. No definite calcified stones are seen in the region of  the common bile duct. Pancreas: No peripancreatic fluid collections. Pancreatic duct mildly prominent, measuring up to 3 mm. Spleen: Grossly normal non contrasted appearance. Adrenals/Urinary Tract: Adrenal glands within normal limits. No calcified stones or hydronephrosis involving the kidneys. 2.2 cm low-density lesion in the mid right kidney, corresponding to the cyst described on MRI. Additional 9 mm hypodense lesion midpole right kidney. There is a 2.4 cm low-density lesion in the mid left kidney, corresponding to the cyst on MRI. Lobulated exophytic 3 cm hypodense lesion from the upper pole of left kidney, corresponds to MRI cyst. Ureters are nondilated. The bladder is normal. Stomach/Bowel: There is mild gastric wall thickening with suboptimal intra luminal distention. There is no dilated small bowel. The appendix is not well identified. There are patchy areas of wall thickening involving the rectosigmoid colon. No evidence of a perforation. Few scattered colonic diverticula. Vascular/Lymphatic: Limited without intravenous contrast. Atherosclerotic vascular calcifications of the aorta. Scattered mesenteric and retroperitoneal subcentimeter lymph nodes. Reproductive: Uterus is not identified and presumably absent. Multiple calcified phleboliths. Other: Mild soft tissue stranding is present within the right upper quadrant near the fundus of the gallbladder. Musculoskeletal: Heterogenous attenuation of the bones could relate to a osteopenia. There is evidence of an old left inferior pubic ramus fracture. Mild SI joint sclerosis. IMPRESSION: 1. Slight dilatation of the gallbladder, no definite intraluminal stones or wall thickening. A small amount of soft tissue thickening/stranding is present in the right upper quadrant near  the hepatic margin and adjacent to the gallbladder fundus, this is nonspecific. If signs or symptoms are referable to the gallbladder, further evaluation with ultrasound may be obtained. Common bile duct appears slightly enlarged ; pancreatic duct slightly more prominent compared to recent MRI. Suggest correlation with laboratory values. 2. Patchy wall thickening involving the rectosigmoid colon, could relate to history of Crohn's disease. No evidence for perforation or abscess. 3. Multiple hypodense lesions within the bilateral kidneys, corresponding to previously noted cysts on MRI. 4. Mild gastric wall thickening, likely related to underdistention although mild gastritis not excluded. 5. Old left inferior pubic ramus fracture. Electronically Signed   By: Donavan Foil M.D.   On: 03/27/2016 11:17    Procedures Procedures (including critical care time)  Medications Ordered in ED Medications  iopamidol (ISOVUE-300) 61 % injection (not administered)  piperacillin-tazobactam (ZOSYN) IVPB 3.375 g (not administered)  morphine 4 MG/ML injection 4 mg (4 mg Intravenous Given 03/27/16 0831)  ondansetron (ZOFRAN) injection 4 mg (4 mg Intravenous Given 03/27/16 0831)  sodium chloride 0.9 % bolus 500 mL (0 mLs Intravenous Stopped 03/27/16 1023)  sodium chloride 0.9 % bolus 500 mL (0 mLs Intravenous Stopped 03/27/16 1058)     Initial Impression / Assessment and Plan / ED Course  I have reviewed the triage vital signs and the nursing notes.  Pertinent labs & imaging results that were available during my care of the patient were reviewed by me and considered in my medical decision making (see chart for details).  Clinical Course    Patient with abnormal LFTs and lipase. Has inflammatory changes around the gallbladder with dilatation of the common bile duct. This with Dr. Arnoldo Morale who recommends Zosyn and will consult on the patient. Does not believe the patient needs an ultrasound at this time. Patient also  with leukopenia and anemia. This may possibly be related to her mercaptopurine. Creatinine is elevated over baseline. Probably related to dehydration. Initiated IV fluids in the emergency department. We'll discuss with hospitalist for admission. Vital signs remained  stable in the emergency department and pain is improved Discussed with Dr.Ukleja. Will admit to MedSurg bed.  Final Clinical Impressions(s) / ED Diagnoses   Final diagnoses:  Vomiting  Abdominal pain    New Prescriptions New Prescriptions   No medications on file     Julianne Rice, MD 03/27/16 1219

## 2016-03-27 NOTE — ED Notes (Signed)
CRITICAL VALUE ALERT  Critical value received:  WBC 1.4  Date of notification:  03/27/16  Time of notification:  580  Critical value read back:Yes.    Nurse who received alert:  Parthenia Ames  MD notified (1st page):  Dr. Lita Mains  Time of first page:  854  MD notified (2nd page):  Time of second page:  Responding MD:  Dr. Lita Mains  Time MD responded:  854

## 2016-03-27 NOTE — ED Notes (Signed)
Paged Dr. Arnoldo Morale per verbal order Dr.Yelverton

## 2016-03-28 ENCOUNTER — Encounter: Payer: Self-pay | Admitting: Gastroenterology

## 2016-03-28 ENCOUNTER — Encounter (HOSPITAL_COMMUNITY): Payer: Self-pay | Admitting: Gastroenterology

## 2016-03-28 DIAGNOSIS — K819 Cholecystitis, unspecified: Secondary | ICD-10-CM

## 2016-03-28 DIAGNOSIS — D72819 Decreased white blood cell count, unspecified: Secondary | ICD-10-CM

## 2016-03-28 DIAGNOSIS — R1011 Right upper quadrant pain: Secondary | ICD-10-CM

## 2016-03-28 DIAGNOSIS — R109 Unspecified abdominal pain: Secondary | ICD-10-CM

## 2016-03-28 DIAGNOSIS — K5090 Crohn's disease, unspecified, without complications: Secondary | ICD-10-CM

## 2016-03-28 LAB — COMPREHENSIVE METABOLIC PANEL
ALK PHOS: 41 U/L (ref 38–126)
ALT: 60 U/L — AB (ref 14–54)
ANION GAP: 10 (ref 5–15)
AST: 83 U/L — ABNORMAL HIGH (ref 15–41)
Albumin: 2.8 g/dL — ABNORMAL LOW (ref 3.5–5.0)
BILIRUBIN TOTAL: 1.7 mg/dL — AB (ref 0.3–1.2)
BUN: 32 mg/dL — ABNORMAL HIGH (ref 6–20)
CALCIUM: 9.1 mg/dL (ref 8.9–10.3)
CO2: 32 mmol/L (ref 22–32)
CREATININE: 0.72 mg/dL (ref 0.44–1.00)
Chloride: 91 mmol/L — ABNORMAL LOW (ref 101–111)
Glucose, Bld: 126 mg/dL — ABNORMAL HIGH (ref 65–99)
Potassium: 4.2 mmol/L (ref 3.5–5.1)
SODIUM: 133 mmol/L — AB (ref 135–145)
TOTAL PROTEIN: 5.8 g/dL — AB (ref 6.5–8.1)

## 2016-03-28 LAB — CBC
HCT: 27.2 % — ABNORMAL LOW (ref 36.0–46.0)
Hemoglobin: 8.9 g/dL — ABNORMAL LOW (ref 12.0–15.0)
MCH: 34 pg (ref 26.0–34.0)
MCHC: 32.7 g/dL (ref 30.0–36.0)
MCV: 103.8 fL — ABNORMAL HIGH (ref 78.0–100.0)
PLATELETS: 115 10*3/uL — AB (ref 150–400)
RBC: 2.62 MIL/uL — AB (ref 3.87–5.11)
RDW: 18.2 % — ABNORMAL HIGH (ref 11.5–15.5)
WBC: 1.5 10*3/uL — ABNORMAL LOW (ref 4.0–10.5)

## 2016-03-28 LAB — LIPASE, BLOOD: LIPASE: 778 U/L — AB (ref 11–51)

## 2016-03-28 MED ORDER — METFORMIN HCL 500 MG PO TABS: 500.0000 mg | ORAL_TABLET | Freq: Two times a day (BID) | ORAL | 6 refills | Status: DC

## 2016-03-28 MED ORDER — PIPERACILLIN-TAZOBACTAM 3.375 G IVPB
3.3750 g | Freq: Three times a day (TID) | INTRAVENOUS | Status: DC
  Administered 2016-03-28 – 2016-03-29 (×3): 3.375 g via INTRAVENOUS
  Filled 2016-03-28 (×3): qty 50

## 2016-03-28 NOTE — Progress Notes (Signed)
Review of chart. Discussed with GI, Dr. Gala Romney. They have seen the patient previously and will see the patient today to determine course of treatment. A surgery consult pending their recommendation.

## 2016-03-28 NOTE — Progress Notes (Signed)
PROGRESS NOTE    Tamara Todd  QHU:[id number] DOB: [birth year] DOA: 03/27/2016 PCP: [name], NP    Brief Narrative:  75 y.o. female with medical history significant of Crohn's Disease, Arthritis, Hypertension, and DM Type II who presented to the ED with abdominal pain and just "not feeling right" for the past 3-4 days.  She reports symptoms began about 2-3 weeks ago.  Symptoms of abdominal pain, nausea increased in intensity 3-4 days ago though.  Last night she voices her pain intensified as did her nausea and ultimately she vomited 2 times.  She came in for evaluation secondary to her history of SBO and crohn's disease.  Pain is worst in the left upper quadrant but radiates across the abdomen to the epigastrium and right upper quadrant.  Denies fever at home.  Last bowel movement was yesterday.  Consistency was that of diarrhea but that is normal for patient.  Denies sick contacts, denies recent travel, denies chest pain, chest pressure, shortness of breath.  Has been eating and drinking significantly less than her normal for the past 3-4 days.   Assessment & Plan:   Active Problems:   Crohn's disease ([place])   Cholecystitis   Acute cholecystitis   Renal insufficiency   Abdominal pain   Leukopenia   Cholecystitis - Gen Surgery consulted, no immediate surgical need at this time - patient on Zosyn previously, will discontinue - continue IVF at 35m/hr  Crohn's Disease - continue patient's home medications of mercaptopurine - GI consulted- appreciate their recommendations  Acute Kidney Injury - previous creatinine normal - Creatinine today of 1.71 - Cr improved to WNL today  Anemia - Seems stable from previous lab draws - H/H stable this am - etiology related to patients mercaptopurine - recent folate, B12, iron studies all WNL  Leukopenia - likely related to mercaptopurine  HTN - continue Norvasc and metoprolol - held ARB- HCTZ secondary to AKI - can consider  restarting above in am but patient's BP has been well controlled   DVT prophylaxis: SCDs Code Status: FULL Family Communication: Patient states she will relay information to her family Disposition Plan: awaiting clearance by GI, likely discharge home in 24 hours   Consultants:   General Surgery  Gastroenterology  Procedures:   none  Antimicrobials:   Zosyn 9/10=> 9/11    Subjective: Patient seen bedside.  She is having some significant pain in the right upper quadrant still.  She has not received any pain medication at this time.  She voices that she had clears for lunch but that she feels uneasy attempting to go home before she knows her GI tract is ready.  No fevers overnight.  Patient denies chest pain, chest pressure, shortness of breath, increased work of breathing.    Objective: Vitals:   03/27/16 2034 03/28/16 0549 03/28/16 1246 03/28/16 1424  BP: 124/68 133/62 (!) 145/62 135/75  Pulse: 82 88 87 (!) 101  Resp: 20 20  20   Temp: 98.3 F (36.8 C) 97.5 F (36.4 C)  99.3 F (37.4 C)  TempSrc: Oral Oral  Oral  SpO2: 98% 100%  98%  Weight:      Height:        Intake/Output Summary (Last 24 hours) at 03/28/16 1754 Last data filed at 03/28/16 1424  Gross per 24 hour  Intake          1371.25 ml  Output             1200 ml  Net  171.25 ml   Filed Weights   03/27/16 0744 03/27/16 0745 03/27/16 1357  Weight: 49.4 kg (109 lb) 49.4 kg (109 lb) 49.3 kg (108 lb 11.2 oz)    Examination:  General exam: Appears calm and comfortable  Respiratory system: Clear to auscultation. Respiratory effort normal. Cardiovascular system: S1 & S2 heard, RRR. No JVD, murmurs, rubs, gallops or clicks. No pedal edema. Gastrointestinal system: Abdomen is nondistended, soft and tender in the right upper quadrant and right flank as well as the epigastrium, No organomegaly or masses felt. Normal bowel sounds heard. Central nervous system: Alert and oriented. No focal neurological  deficits. Extremities: Symmetric 5 x 5 power. Skin: No rashes, lesions or ulcers Psychiatry: Judgement and insight appear normal. Mood & affect appropriate.     Data Reviewed: I have personally reviewed following labs and imaging studies  CBC:  Recent Labs Lab 03/27/16 0830 03/28/16 0557  WBC 1.4* 1.5*  NEUTROABS 1.0*  --   HGB 9.2* 8.9*  HCT 29.4* 27.2*  MCV 108.5* 103.8*  PLT 160 202*   Basic Metabolic Panel:  Recent Labs Lab 03/23/16 1400 03/27/16 0830 03/28/16 0557  NA  --  134* 133*  K  --  5.0 4.2  CL  --  90* 91*  CO2  --  24 32  GLUCOSE  --  167* 126*  BUN  --  40* 32*  CREATININE 0.60 1.71* 0.72  CALCIUM  --  9.2 9.1   GFR: Estimated Creatinine Clearance: 42.1 mL/min (by C-G formula based on SCr of 0.8 mg/dL). Liver Function Tests:  Recent Labs Lab 03/27/16 0830 03/28/16 0557  AST 155* 83*  ALT 81* 60*  ALKPHOS 53 41  BILITOT 1.7* 1.7*  PROT 7.0 5.8*  ALBUMIN 3.3* 2.8*    Recent Labs Lab 03/27/16 0830 03/28/16 0557  LIPASE 374* 778*   No results for input(s): AMMONIA in the last 168 hours. Coagulation Profile: No results for input(s): INR, PROTIME in the last 168 hours. Cardiac Enzymes: No results for input(s): CKTOTAL, CKMB, CKMBINDEX, TROPONINI in the last 168 hours. BNP (last 3 results) No results for input(s): PROBNP in the last 8760 hours. HbA1C: No results for input(s): HGBA1C in the last 72 hours. CBG: No results for input(s): GLUCAP in the last 168 hours. Lipid Profile: No results for input(s): CHOL, HDL, LDLCALC, TRIG, CHOLHDL, LDLDIRECT in the last 72 hours. Thyroid Function Tests: No results for input(s): TSH, T4TOTAL, FREET4, T3FREE, THYROIDAB in the last 72 hours. Anemia Panel: No results for input(s): VITAMINB12, FOLATE, FERRITIN, TIBC, IRON, RETICCTPCT in the last 72 hours. Sepsis Labs: No results for input(s): PROCALCITON, LATICACIDVEN in the last 168 hours.  No results found for this or any previous visit (from  the past 240 hour(s)).       Radiology Studies: Ct Abdomen Pelvis Wo Contrast  Result Date: 03/27/2016 CLINICAL DATA:  Intermittent abdominal pain and vomiting for 2 weeks. History of Crohn's disease and pancreatitis. History of bowel resection and rectovaginal fistula closure. EXAM: CT ABDOMEN AND PELVIS WITHOUT CONTRAST TECHNIQUE: Multidetector CT imaging of the abdomen and pelvis was performed following the standard protocol without IV contrast. COMPARISON:  MRI abdomen 03/23/2016 FINDINGS: Lower chest: Patchy dependent atelectasis posteriorly within the lung bases. Tiny parenchymal calcifications in the posterior right lung base. Mild right middle and lower lobe subpleural interstitial opacities could relate to mild fibrosis. No acute consolidation or effusion. Heart size is slightly enlarged. Hepatobiliary: Liver size upper normal at 17 cm. Noncontrast appearance of the  liver otherwise unremarkable. Gallbladder slightly prominent but no definite intraluminal stones are visualized. No wall thickening. Common bile duct is slightly tortuous and mildly prominent, measuring up to 8-9 mm; at the head of the pancreas, this measures 6 mm. It is grossly unchanged compared to recent MRI. Pancreatic duct at the head neck and proximal body appears slightly increased in size compared to MRI, it measures approximately 3 mm in diameter. No definite calcified stones are seen in the region of the common bile duct. Pancreas: No peripancreatic fluid collections. Pancreatic duct mildly prominent, measuring up to 3 mm. Spleen: Grossly normal non contrasted appearance. Adrenals/Urinary Tract: Adrenal glands within normal limits. No calcified stones or hydronephrosis involving the kidneys. 2.2 cm low-density lesion in the mid right kidney, corresponding to the cyst described on MRI. Additional 9 mm hypodense lesion midpole right kidney. There is a 2.4 cm low-density lesion in the mid left kidney, corresponding to the cyst on  MRI. Lobulated exophytic 3 cm hypodense lesion from the upper pole of left kidney, corresponds to MRI cyst. Ureters are nondilated. The bladder is normal. Stomach/Bowel: There is mild gastric wall thickening with suboptimal intra luminal distention. There is no dilated small bowel. The appendix is not well identified. There are patchy areas of wall thickening involving the rectosigmoid colon. No evidence of a perforation. Few scattered colonic diverticula. Vascular/Lymphatic: Limited without intravenous contrast. Atherosclerotic vascular calcifications of the aorta. Scattered mesenteric and retroperitoneal subcentimeter lymph nodes. Reproductive: Uterus is not identified and presumably absent. Multiple calcified phleboliths. Other: Mild soft tissue stranding is present within the right upper quadrant near the fundus of the gallbladder. Musculoskeletal: Heterogenous attenuation of the bones could relate to a osteopenia. There is evidence of an old left inferior pubic ramus fracture. Mild SI joint sclerosis. IMPRESSION: 1. Slight dilatation of the gallbladder, no definite intraluminal stones or wall thickening. A small amount of soft tissue thickening/stranding is present in the right upper quadrant near the hepatic margin and adjacent to the gallbladder fundus, this is nonspecific. If signs or symptoms are referable to the gallbladder, further evaluation with ultrasound may be obtained. Common bile duct appears slightly enlarged ; pancreatic duct slightly more prominent compared to recent MRI. Suggest correlation with laboratory values. 2. Patchy wall thickening involving the rectosigmoid colon, could relate to history of Crohn's disease. No evidence for perforation or abscess. 3. Multiple hypodense lesions within the bilateral kidneys, corresponding to previously noted cysts on MRI. 4. Mild gastric wall thickening, likely related to underdistention although mild gastritis not excluded. 5. Old left inferior pubic  ramus fracture. Electronically Signed   By: Donavan Foil M.D.   On: 03/27/2016 11:17        Scheduled Meds: . amLODipine  10 mg Oral Daily  . lipase/protease/amylase  72,000 Units Oral QID  . mercaptopurine  75 mg Oral Daily  . metoprolol succinate  25 mg Oral Daily  . piperacillin-tazobactam (ZOSYN)  IV  3.375 g Intravenous Q8H  . sodium chloride flush  3 mL Intravenous Q12H   Continuous Infusions: . sodium chloride 75 mL/hr at 03/28/16 0552     LOS: 1 day    Time spent: 30 minutes    Newman Pies, MD [facility] Pager [phone]  If 7PM-7AM, please contact night-coverage [email or web address] Password [facility] 03/28/2016, 5:54 PM

## 2016-03-28 NOTE — Consult Note (Signed)
Referring Provider: Dr. Arnoldo Morale  Primary Care Physician:  [name], NP Primary Gastroenterologist:  Dr. Oneida Alar   Date of Admission: 03/27/16 Date of Consultation: 03/28/16  Reason for Consultation:  Elevated LFTs   HPI:  Tamara Todd is a 75 y.o. year old female with a history of Crohn's disease, diagnosed in her 64s. She has had several bowel resections in the remote past for strictures. She has been chronically maintained on mercaptopurine. Most recently seen in our office May 2017 to establish care. Underwent colonoscopy/EGD in July 2017 with findings of normal neo-terminal ileum, adenomatous rectal polyp. EGD with chronic gastritis, few benign appearing gastric polyps, no source for weight loss. She has completed a hydrogen breath test as outpatient and found to have small bowel bacterial overgrowth. Started on Creon as outpatient due to loose stool, concern for possible pancreatic insufficiency. TSH normal recently. CXR as outpatient without acute findings. Noted to have lost weight unintentionally from a baseline of 124 per patient down to 108 over the past year or so. Was seen at outside hospital in March with lipase elevated over 10,000, CT abdomen and pelvis with contrast on 10/08/2015. Gallbladder unremarkable. Vague stranding adjacent to the pancreas. Mild thickening of the distal rectum with vague stranding seen within the right abdomen adjacent to the posterior gallbladder and kidney.   Came to hospital yesterday with abdominal cramping, pain in lower abdomen. Symptoms started Friday, worsening until yesterday and prompting presentation to the ED. Associated nausea and vomiting. States she really wasn't doing ok even before this. Felt like she did when she had "crohn's attacks". Has diarrhea all the time. Will have at least 4 loose stools a day, but "sometimes my bowels will be alright". Some days will have normal BMs. Taking Creon without much significant improvement.  No rectal  bleeding. Mercaptopurine 75 mg daily. Diclofenac daily. Pain improved since admission. Nausea improved. DENIES any alcohol use. Has a good appetite but reports the unintentional weight loss.   Prior evaluation: MRI/MRCP 03/23/2016, about a week ago, without evidence of biliary ductal dilatation or choledocholithiasis. No evidence of pancreatic abnormality. EUS with Dr. Paulita Fujita recommended but unable to reach patient to let her know this recommendation as an outpatient.   CT this admission with slight dilatation of gallbladder, no definite intraluminal stones or wall thickening. See full results below. LFTs with increased transaminases, now improving. Lipase 374 on admission, now 778 but patient clinically improving.    Past Medical History:  Diagnosis Date  . Crohn's disease ([place])    used to be followed by Dr. Algis Greenhouse. diagnosis in her 38s. multiple surgeries. remicade allergy.  . Diabetes ([place])   . GERD (gastroesophageal reflux disease)   . HTN (hypertension)   . Hyperlipidemia   . Hypertension   . Osteoporosis       . Pancreatitis    09/2015    Past Surgical History:  Procedure Laterality Date  . BACTERIAL OVERGROWTH TEST N/A 02/24/2016   POSITIVE for small bowel bacterial overgrowth   . BIOPSY  02/10/2016   Procedure: BIOPSY;  Surgeon: Danie Binder, MD;  Location: [facility];  Service: Endoscopy;;  Random colon biopsies Gastric biopsies for h pylori gastric polyp biopsies  . BOWEL RESECTION     TWICE for stricture, including right colectomy with terminal ileal resection  . COLONOSCOPY    . COLONOSCOPY N/A 02/10/2016   Dr. Oneida Alar: normal neo terminal ileum, diverticula in sigmoid colon, non-bleeding internal hemorrhoids, adenomatous rectal polyp.   . ESOPHAGOGASTRODUODENOSCOPY  N/A 02/10/2016   Dr. Oneida Alar: chronic gastritis, small hiatal hernia, few benign-appearing gastric polyps, no source for weight loss.   Marland Kitchen PARTIAL HYSTERECTOMY    . POLYPECTOMY  02/10/2016   Procedure:  POLYPECTOMY;  Surgeon: Danie Binder, MD;  Location: [facility];  Service: Endoscopy;;  Rectal polyps x 2 removed 1 by hot snare and number 2 via cold forceps  . 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     Prior to Admission medications   Medication Sig Start Date End Date Taking? Authorizing Provider  acetaminophen (TYLENOL) 650 MG CR tablet Take 650 mg by mouth 2 (two) times daily.   Yes Historical Provider, MD  amLODipine (NORVASC) 10 MG tablet Take 10 mg by mouth daily. 11/17/15  Yes Historical Provider, MD  calcium carbonate (OS-CAL - DOSED IN MG OF ELEMENTAL CALCIUM) 1250 (500 Ca) MG tablet Take 1 tablet by mouth 2 (two) times daily with a meal.   Yes Historical Provider, MD  cyanocobalamin (,VITAMIN B-12,) 1000 MCG/ML injection Inject 1 mL into the muscle every 14 (fourteen) days. 01/08/16  Yes Historical Provider, MD  diclofenac (VOLTAREN) 75 MG EC tablet Take 75 mg by mouth daily.   Yes Historical Provider, MD  FeFum-FePo-FA-B Cmp-C-Zn-Mn-Cu (SE-TAN PLUS) 162-115.2-1 MG CAPS Take 1 capsule by mouth daily.   Yes Historical Provider, MD  lipase/protease/amylase (CREON) 36000 UNITS CPEP capsule Take 72,000 Units by mouth 4 (four) times daily. With meals and snack.   Yes Historical Provider, MD  losartan-hydrochlorothiazide (HYZAAR) 50-12.5 MG tablet Take 1 tablet by mouth daily. 11/13/15  Yes Historical Provider, MD  magnesium oxide (MAG-OX) 400 (241.3 Mg) MG tablet Take 1 tablet by mouth 2 (two) times daily with a meal. 10/11/15  Yes Historical Provider, MD  mercaptopurine (PURINETHOL) 50 MG tablet Give on an empty stomach 1 hour before or 2 hours after meals. Caution: Chemotherapy. Patient taking differently: Take 75 mg by mouth daily. Give on an empty stomach 1 hour before or 2 hours after meals. Caution: Chemotherapy. 02/10/16  Yes Danie Binder, MD  metFORMIN (GLUCOPHAGE) 500 MG tablet TAKE 1 TABLET BY MOUTH TWICE DAILY. 10/24/15   Yes Historical Provider, MD  metoprolol succinate (TOPROL-XL) 25 MG 24 hr tablet Take 25 mg by mouth daily. 10/09/15  Yes Historical Provider, MD  omeprazole (PRILOSEC) 20 MG capsule Take 20 mg by mouth daily. 10/25/15  Yes Historical Provider, MD  Vitamin D, Ergocalciferol, (DRISDOL) 50000 units CAPS capsule Take 50,000 Units by mouth once a week. Takes on Mondays. 09/24/15  Yes Historical Provider, MD  amoxicillin-clavulanate (AUGMENTIN) 500-125 MG tablet 1 PO BID FOR 5 DAYS Patient not taking: Reported on 03/27/2016 02/25/16   Danie Binder, MD    Current Facility-Administered Medications  Medication Dose Route Frequency Provider Last Rate Last Dose  . 0.9 %  sodium chloride infusion   Intravenous STAT Julianne Rice, MD      . 0.9 %  sodium chloride infusion   Intravenous Continuous Wallis Bamberg, MD 75 mL/hr at 03/28/16 [phone]    . amLODipine (NORVASC) tablet 10 mg  10 mg Oral Daily Wallis Bamberg, MD      . lipase/protease/amylase (CREON) capsule 72,000 Units  72,000 Units Oral QID Wallis Bamberg, MD      . mercaptopurine (PURINETHOL) tablet 75 mg  75 mg Oral Daily Wallis Bamberg, MD      . metoprolol succinate (TOPROL-XL) 24 hr tablet  25 mg  25 mg Oral Daily Wallis Bamberg, MD      . morphine 2 MG/ML injection 2 mg  2 mg Intravenous Q4H PRN Wallis Bamberg, MD   2 mg at 03/27/16 2137  . piperacillin-tazobactam (ZOSYN) IVPB 3.375 g  3.375 g Intravenous Q8H Wallis Bamberg, MD      . polyethylene glycol (MIRALAX / GLYCOLAX) packet 17 g  17 g Oral Daily PRN Wallis Bamberg, MD      . sodium chloride flush (NS) 0.9 % injection 3 mL  3 mL Intravenous Q12H Wallis Bamberg, MD        Allergies as of 03/27/2016 - Review Complete 03/27/2016  Allergen Reaction Noted  . Lisinopril  12/08/2015  . Remicade [infliximab] Other (See Comments) 12/08/2015  . Codeine Rash 12/08/2015    Family History  Problem Relation Age of Onset  . Colon cancer Father     43s   . Inflammatory bowel disease Sister     ???    Social History   Social History  . Marital status: Married    Spouse name: N/A  . Number of children: 1  . Years of education: N/A   Occupational History  . retired    Social History Main Topics  . Smoking status: Never Smoker  . Smokeless tobacco: Never Used  . Alcohol use No  . Drug use: No  . Sexual activity: Not on file   Other Topics Concern  . Not on file   Social History Narrative  . No narrative on file    Review of Systems: As mentioned in HPI   Physical Exam: Vital signs in last 24 hours: Temp:  [97.5 F (36.4 C)-98.3 F (36.8 C)] 97.5 F (36.4 C) (09/11 0549) Pulse Rate:  [81-92] 88 (09/11 0549) Resp:  [18-20] 20 (09/11 0549) BP: (108-133)/(60-85) 133/62 (09/11 0549) SpO2:  [95 %-100 %] 100 % (09/11 0549) Weight:  [108 lb 11.2 oz (49.3 kg)] 108 lb 11.2 oz (49.3 kg) (09/10 1357) Last BM Date: 03/27/16 General:   Alert,  Well-developed, well-nourished, pleasant and cooperative in NAD Head:  Normocephalic and atraumatic. Eyes:  Sclera clear, no icterus.   Conjunctiva pink. Ears:  Normal auditory acuity. Nose:  No deformity, discharge,  or lesions. Mouth:  No deformity or lesions Lungs:  Clear throughout to auscultation.  Heart:  Regular rate and rhythm, query soft systolic murmur Abdomen:  Soft, nontender and nondistended. No masses, hepatosplenomegaly or hernias noted. Normal bowel sounds, without guarding, and without rebound.   Rectal:  Deferred  Msk:  Symmetrical without gross deformities. Normal posture. Extremities:  Without  edema. Neurologic:  Alert and  oriented x4 Skin:  Intact without significant lesions or rashes. Psych:  Alert and cooperative. Normal mood and affect.  Intake/Output from previous day: 09/10 0701 - 09/11 0700 In: 1171.3 [I.V.:1071.3; IV Piggyback:100] Out: -  Intake/Output this shift: No intake/output data recorded.  Lab Results:  Recent Labs  03/27/16 0830  03/28/16 0557  WBC 1.4* 1.5*  HGB 9.2* 8.9*  HCT 29.4* 27.2*  PLT 160 115*   Lab Results  Component Value Date   IRON 69 12/29/2015   TIBC 432 12/29/2015   FERRITIN 29 12/29/2015     BMET  Recent Labs  03/27/16 0830 03/28/16 0557  NA 134* 133*  K 5.0 4.2  CL 90* 91*  CO2 24 32  GLUCOSE 167* 126*  BUN 40* 32*  CREATININE 1.71* 0.72  CALCIUM 9.2 9.1  LFT  Recent Labs  03/27/16 0830 03/28/16 0557  PROT 7.0 5.8*  ALBUMIN 3.3* 2.8*  AST 155* 83*  ALT 81* 60*  ALKPHOS 53 41  BILITOT 1.7* 1.7*   Lab Results  Component Value Date   LIPASE 374 (H) 03/27/2016   9/11 Lipase 778   Studies/Results: Ct Abdomen Pelvis Wo Contrast  Result Date: 03/27/2016 CLINICAL DATA:  Intermittent abdominal pain and vomiting for 2 weeks. History of Crohn's disease and pancreatitis. History of bowel resection and rectovaginal fistula closure. EXAM: CT ABDOMEN AND PELVIS WITHOUT CONTRAST TECHNIQUE: Multidetector CT imaging of the abdomen and pelvis was performed following the standard protocol without IV contrast. COMPARISON:  MRI abdomen 03/23/2016 FINDINGS: Lower chest: Patchy dependent atelectasis posteriorly within the lung bases. Tiny parenchymal calcifications in the posterior right lung base. Mild right middle and lower lobe subpleural interstitial opacities could relate to mild fibrosis. No acute consolidation or effusion. Heart size is slightly enlarged. Hepatobiliary: Liver size upper normal at 17 cm. Noncontrast appearance of the liver otherwise unremarkable. Gallbladder slightly prominent but no definite intraluminal stones are visualized. No wall thickening. Common bile duct is slightly tortuous and mildly prominent, measuring up to 8-9 mm; at the head of the pancreas, this measures 6 mm. It is grossly unchanged compared to recent MRI. Pancreatic duct at the head neck and proximal body appears slightly increased in size compared to MRI, it measures approximately 3 mm in diameter. No  definite calcified stones are seen in the region of the common bile duct. Pancreas: No peripancreatic fluid collections. Pancreatic duct mildly prominent, measuring up to 3 mm. Spleen: Grossly normal non contrasted appearance. Adrenals/Urinary Tract: Adrenal glands within normal limits. No calcified stones or hydronephrosis involving the kidneys. 2.2 cm low-density lesion in the mid right kidney, corresponding to the cyst described on MRI. Additional 9 mm hypodense lesion midpole right kidney. There is a 2.4 cm low-density lesion in the mid left kidney, corresponding to the cyst on MRI. Lobulated exophytic 3 cm hypodense lesion from the upper pole of left kidney, corresponds to MRI cyst. Ureters are nondilated. The bladder is normal. Stomach/Bowel: There is mild gastric wall thickening with suboptimal intra luminal distention. There is no dilated small bowel. The appendix is not well identified. There are patchy areas of wall thickening involving the rectosigmoid colon. No evidence of a perforation. Few scattered colonic diverticula. Vascular/Lymphatic: Limited without intravenous contrast. Atherosclerotic vascular calcifications of the aorta. Scattered mesenteric and retroperitoneal subcentimeter lymph nodes. Reproductive: Uterus is not identified and presumably absent. Multiple calcified phleboliths. Other: Mild soft tissue stranding is present within the right upper quadrant near the fundus of the gallbladder. Musculoskeletal: Heterogenous attenuation of the bones could relate to a osteopenia. There is evidence of an old left inferior pubic ramus fracture. Mild SI joint sclerosis. IMPRESSION: 1. Slight dilatation of the gallbladder, no definite intraluminal stones or wall thickening. A small amount of soft tissue thickening/stranding is present in the right upper quadrant near the hepatic margin and adjacent to the gallbladder fundus, this is nonspecific. If signs or symptoms are referable to the gallbladder,  further evaluation with ultrasound may be obtained. Common bile duct appears slightly enlarged ; pancreatic duct slightly more prominent compared to recent MRI. Suggest correlation with laboratory values. 2. Patchy wall thickening involving the rectosigmoid colon, could relate to history of Crohn's disease. No evidence for perforation or abscess. 3. Multiple hypodense lesions within the bilateral kidneys, corresponding to previously noted cysts on MRI. 4. Mild gastric wall thickening,  likely related to underdistention although mild gastritis not excluded. 5. Old left inferior pubic ramus fracture. Electronically Signed   By: Donavan Foil M.D.   On: 03/27/2016 11:17    Impression: 75 year old female with long-standing history of Crohn's disease, prior bout with pancreatitis earlier this year, admitted with abdominal pain, N/V, elevated LFTs, elevated lipase but non-contrast CT without overt evidence of pancreatitis, clinically improving with supportive measures.   Has remained on mercaptopurine chronically for Crohn's disease, which has known adverse reactions to include hepatotoxicity, pancreatitis, immunosuppression, lymphoproliferative disorders. etc. To be thorough, we will order extensive labs to include metabolites from [facility] to ensure mercaptopurine is at adequate dosing. The specialized container was hand-delivered to the floor, along with specialized orders to be taken to lab at time of blood draw.   In interim, cholecystectomy could be pursued in an elective setting, as a biliary component is more likely the culprit for her symptoms. Discussed with Dr. Arnoldo Morale briefly. She has had documented evidence of pancreatitis earlier this year, and MRI/MRCP last week was without evidence of occult malignancy. LFTs are improving. We will advance her diet to clears.   Weight loss is concerning, and hematology referral will also be obtained due to her long-standing use of mercaptopurine, obvious CBC  abnormalities.  Will have her remain on this for now due to long-standing Crohn's disease and continue to follow closely until [facility] obtained.   Plan: Advance to clear liquids [facility] (handwritten "downtime" labs as not able to order in computer)  Outpatient evaluation for cholecystectomy Repeat LFTs in am Hematology referral as outpatient Avoid NSAIDs: patient is on diclofenac as outpatient.    Annitta Needs, ANP-BC [facility]      LOS: 1 day    03/28/2016, 8:44 AM

## 2016-03-28 NOTE — Care Management Note (Signed)
Case Management Note  Patient Details  Name: Tamara Todd MRN: [id number] Date of Birth: Mar 22, 1941  Subjective/Objective:     Patient adm from home with abd pain and vomiting, being evaluated by surgery and GI . She is ind with ADL's. She has PCP and still drives to appointments. She has insurance and reports no issues affording medications.         Action/Plan: Anticipate DC with self care. No CM needs at this time.    Expected Discharge Date:           03/29/2016       Expected Discharge Plan:  Home/Self Care  In-House Referral:  NA  Discharge planning Services  CM Consult  Post Acute Care Choice:  NA Choice offered to:  NA  DME Arranged:    DME Agency:     HH Arranged:    HH Agency:     Status of Service:  Completed, signed off  If discussed at [employer] of Stay Meetings, dates discussed:    Additional Comments:  Shadawn Hanaway, Chauncey Reading, RN 03/28/2016, 10:59 AM

## 2016-03-28 NOTE — Care Management Important Message (Signed)
Important Message  Patient Details  Name: Tamara Todd MRN: [id number] Date of Birth: 09-22-1940   Medicare Important Message Given:  Yes    Drianna Chandran, Chauncey Reading, RN 03/28/2016, 11:03 AM

## 2016-03-29 ENCOUNTER — Telehealth: Payer: Self-pay | Admitting: Gastroenterology

## 2016-03-29 ENCOUNTER — Other Ambulatory Visit: Payer: Self-pay

## 2016-03-29 DIAGNOSIS — K810 Acute cholecystitis: Secondary | ICD-10-CM

## 2016-03-29 DIAGNOSIS — K859 Acute pancreatitis without necrosis or infection, unspecified: Secondary | ICD-10-CM

## 2016-03-29 DIAGNOSIS — D72819 Decreased white blood cell count, unspecified: Secondary | ICD-10-CM

## 2016-03-29 LAB — HEPATIC FUNCTION PANEL
ALK PHOS: 45 U/L (ref 38–126)
ALT: 61 U/L — ABNORMAL HIGH (ref 14–54)
AST: 90 U/L — ABNORMAL HIGH (ref 15–41)
Albumin: 2.6 g/dL — ABNORMAL LOW (ref 3.5–5.0)
BILIRUBIN INDIRECT: 1.3 mg/dL — AB (ref 0.3–0.9)
BILIRUBIN TOTAL: 1.8 mg/dL — AB (ref 0.3–1.2)
Bilirubin, Direct: 0.5 mg/dL (ref 0.1–0.5)
Total Protein: 5.5 g/dL — ABNORMAL LOW (ref 6.5–8.1)

## 2016-03-29 MED ORDER — MERCAPTOPURINE 50 MG PO TABS
50.0000 mg | ORAL_TABLET | Freq: Every day | ORAL | Status: DC
  Administered 2016-03-30: 50 mg via ORAL
  Filled 2016-03-29 (×3): qty 1

## 2016-03-29 NOTE — Telephone Encounter (Signed)
Faxed referral info to Dr. Arnoldo Morale office. Entered referral for Hematology [facility]).

## 2016-03-29 NOTE — Telephone Encounter (Signed)
Please arrange outpatient referral to Dr. Arnoldo Morale for cholecystectomy.  Please arrange outpatient referral for Hematology evaluation: neutropenia/leukpenia/thrombocytopenia, on chronic mercaptopurine, weight loss.

## 2016-03-29 NOTE — Progress Notes (Signed)
Subjective: Tolerating clear liquids. No N/V. Mild abdominal discomfort but improved from admission. Wants to advance diet.   Objective: Vital signs in last 24 hours: Temp:  [98.5 F (36.9 C)-99.3 F (37.4 C)] 98.5 F (36.9 C) (09/12 0503) Pulse Rate:  [82-101] 82 (09/12 0503) Resp:  [20] 20 (09/11 1424) BP: (133-145)/(62-75) 133/62 (09/12 0503) SpO2:  [97 %-98 %] 97 % (09/12 0503) Last BM Date: 03/27/16 General:   Alert and oriented, pleasant Head:  Normocephalic and atraumatic. Eyes:  No icterus, sclera clear. Conjuctiva pink.  Abdomen:  Bowel sounds present, soft, non-tender, non-distended. No HSM or hernias noted.  Neurologic:  Alert and  oriented x4 Psych:  Alert and cooperative. Normal mood and affect.  Intake/Output from previous day: 09/11 0701 - 09/12 0700 In: 2488.8 [P.O.:720; I.V.:1668.8; IV Piggyback:100] Out: 2100 [Urine:2100] Intake/Output this shift: No intake/output data recorded.  Lab Results:  Recent Labs  03/27/16 0830 03/28/16 0557  WBC 1.4* 1.5*  HGB 9.2* 8.9*  HCT 29.4* 27.2*  PLT 160 115*   BMET  Recent Labs  03/27/16 0830 03/28/16 0557  NA 134* 133*  K 5.0 4.2  CL 90* 91*  CO2 24 32  GLUCOSE 167* 126*  BUN 40* 32*  CREATININE 1.71* 0.72  CALCIUM 9.2 9.1   LFT  Recent Labs  03/27/16 0830 03/28/16 0557 03/29/16 0546  PROT 7.0 5.8* 5.5*  ALBUMIN 3.3* 2.8* 2.6*  AST 155* 83* 90*  ALT 81* 60* 61*  ALKPHOS 53 41 45  BILITOT 1.7* 1.7* 1.8*  BILIDIR  --   --  0.5  IBILI  --   --  1.3*     Studies/Results: Ct Abdomen Pelvis Wo Contrast  Result Date: 03/27/2016 CLINICAL DATA:  Intermittent abdominal pain and vomiting for 2 weeks. History of Crohn's disease and pancreatitis. History of bowel resection and rectovaginal fistula closure. EXAM: CT ABDOMEN AND PELVIS WITHOUT CONTRAST TECHNIQUE: Multidetector CT imaging of the abdomen and pelvis was performed following the standard protocol without IV contrast. COMPARISON:  MRI  abdomen 03/23/2016 FINDINGS: Lower chest: Patchy dependent atelectasis posteriorly within the lung bases. Tiny parenchymal calcifications in the posterior right lung base. Mild right middle and lower lobe subpleural interstitial opacities could relate to mild fibrosis. No acute consolidation or effusion. Heart size is slightly enlarged. Hepatobiliary: Liver size upper normal at 17 cm. Noncontrast appearance of the liver otherwise unremarkable. Gallbladder slightly prominent but no definite intraluminal stones are visualized. No wall thickening. Common bile duct is slightly tortuous and mildly prominent, measuring up to 8-9 mm; at the head of the pancreas, this measures 6 mm. It is grossly unchanged compared to recent MRI. Pancreatic duct at the head neck and proximal body appears slightly increased in size compared to MRI, it measures approximately 3 mm in diameter. No definite calcified stones are seen in the region of the common bile duct. Pancreas: No peripancreatic fluid collections. Pancreatic duct mildly prominent, measuring up to 3 mm. Spleen: Grossly normal non contrasted appearance. Adrenals/Urinary Tract: Adrenal glands within normal limits. No calcified stones or hydronephrosis involving the kidneys. 2.2 cm low-density lesion in the mid right kidney, corresponding to the cyst described on MRI. Additional 9 mm hypodense lesion midpole right kidney. There is a 2.4 cm low-density lesion in the mid left kidney, corresponding to the cyst on MRI. Lobulated exophytic 3 cm hypodense lesion from the upper pole of left kidney, corresponds to MRI cyst. Ureters are nondilated. The bladder is normal. Stomach/Bowel: There is mild gastric  wall thickening with suboptimal intra luminal distention. There is no dilated small bowel. The appendix is not well identified. There are patchy areas of wall thickening involving the rectosigmoid colon. No evidence of a perforation. Few scattered colonic diverticula.  Vascular/Lymphatic: Limited without intravenous contrast. Atherosclerotic vascular calcifications of the aorta. Scattered mesenteric and retroperitoneal subcentimeter lymph nodes. Reproductive: Uterus is not identified and presumably absent. Multiple calcified phleboliths. Other: Mild soft tissue stranding is present within the right upper quadrant near the fundus of the gallbladder. Musculoskeletal: Heterogenous attenuation of the bones could relate to a osteopenia. There is evidence of an old left inferior pubic ramus fracture. Mild SI joint sclerosis. IMPRESSION: 1. Slight dilatation of the gallbladder, no definite intraluminal stones or wall thickening. A small amount of soft tissue thickening/stranding is present in the right upper quadrant near the hepatic margin and adjacent to the gallbladder fundus, this is nonspecific. If signs or symptoms are referable to the gallbladder, further evaluation with ultrasound may be obtained. Common bile duct appears slightly enlarged ; pancreatic duct slightly more prominent compared to recent MRI. Suggest correlation with laboratory values. 2. Patchy wall thickening involving the rectosigmoid colon, could relate to history of Crohn's disease. No evidence for perforation or abscess. 3. Multiple hypodense lesions within the bilateral kidneys, corresponding to previously noted cysts on MRI. 4. Mild gastric wall thickening, likely related to underdistention although mild gastritis not excluded. 5. Old left inferior pubic ramus fracture. Electronically Signed   By: Donavan Foil M.D.   On: 03/27/2016 11:17    Assessment: 75 year old female admitted with mild pancreatitis with etiologies likely secondary to microlithiasis in the setting of documented fluctuating transaminases. Med effect less likely. Imaging reviewed without obvious pancreatic lesion. Clinically improved, tolerating clear liquids. LFTs improved from admission, stable.   Weight loss: concerning. Will be  referred to hematology as outpatient due to long-standing use of mercaptopurine, obvious CBC abnormalities. [facility] labs ordered to obtain metabolites (following up with lab today as this was a special send out). Remain on mercaptopurine for now but recommend dose adjustment due to abnormalities with repeat CBC in 1 week as outpatient.   Cholecystectomy to be pursued in the outpatient setting. She has had documented evidence of pancreatitis earlier this year (March 2017) at an outside facility.   Plan: Advance to full liquids Will follow up with lab personally today regarding special lab send out Outpatient evaluation for cholecystectomy to be arranged by our office Avoid NSAIDs Hematology referral as outpatient to be arranged by our office Dose adjustment of mercaptopurine to 50 mg daily and repeat CBC in 1 week due to hematologic abnormalities.   Annitta Needs, ANP-BC [facility]    LOS: 2 days    03/29/2016, 8:02 AM

## 2016-03-29 NOTE — Progress Notes (Signed)
PROGRESS NOTE    Tamara Todd  UYQ:[id number] DOB: 10/13/1940 DOA: 03/27/2016 PCP: [name], NP    Brief Narrative:  75 y.o. female with medical history significant of Crohn's Disease, Arthritis, Hypertension, and DM Type II who presented to the ED with abdominal pain and just "not feeling right" for the past 3-4 days.  She reports symptoms began about 2-3 weeks ago.  Symptoms of abdominal pain, nausea increased in intensity 3-4 days ago though.  Last night she voices her pain intensified as did her nausea and ultimately she vomited 2 times.  She came in for evaluation secondary to her history of SBO and crohn's disease.  Pain is worst in the left upper quadrant but radiates across the abdomen to the epigastrium and right upper quadrant.  Denies fever at home.  Last bowel movement was yesterday.  Consistency was that of diarrhea but that is normal for patient.  Denies sick contacts, denies recent travel, denies chest pain, chest pressure, shortness of breath.  Has been eating and drinking significantly less than her normal for the past 3-4 days.   Assessment & Plan:   Active Problems:   Crohn's disease ([place])   Cholecystitis   Acute cholecystitis   Renal insufficiency   Abdominal pain   Leukopenia   Other acute pancreatitis   Cholecystitis - Gen Surgery consulted, no immediate surgical need at this time - patient on Zosyn previously, will discontinue - continue IVF at 43m/hr, will continue at this time as patient only on full liquids  Crohn's Disease - continue patient's home medications of mercaptopurine - will decrease per GI recommendations to 52mdaily with a repeat CBC in 1 week - GI consulted- appreciate their recommendations  Acute Kidney Injury - previous creatinine normal - Creatinine today of 1.71 - Cr improved to WNL today  Anemia - Seems stable from previous lab draws - H/H stable this am - etiology related to patients mercaptopurine - recent folate,  B12, iron studies all WNL  Leukopenia - likely related to mercaptopurine  HTN - continue Norvasc and metoprolol - held ARB- HCTZ secondary to AKI - can consider restarting above in am but patient's BP has been well controlled   DVT prophylaxis: SCDs Code Status: FULL Family Communication: Patient denies needing me to call her family Disposition Plan: awaiting clearance by GI, likely discharge home tomorrow   Consultants:   General Surgery  Gastroenterology  Procedures:   none  Antimicrobials:   Zosyn 9/10=> 9/12    Subjective: Patient voices that she is starting to feel better. Able to tolerate her full liquid lunch.  No emesis overnight.  Mild abdominal pain overnight.  No fevers.  Urinating well.  No chest pain, chest pressure, shortness of breath, increased work of breathing.    Objective: Vitals:   03/28/16 1424 03/28/16 2112 03/29/16 0503 03/29/16 1447  BP: 135/75 137/68 133/62 118/68  Pulse: (!) 101 84 82 82  Resp: 20   20  Temp: 99.3 F (37.4 C) 98.8 F (37.1 C) 98.5 F (36.9 C) 98.6 F (37 C)  TempSrc: Oral Oral Oral Oral  SpO2: 98% 97% 97% 100%  Weight:      Height:        Intake/Output Summary (Last 24 hours) at 03/29/16 1607 Last data filed at 03/29/16 1500  Gross per 24 hour  Intake          3648.75 ml  Output             1600  ml  Net          2048.75 ml   Filed Weights   03/27/16 0744 03/27/16 0745 03/27/16 1357  Weight: 49.4 kg (109 lb) 49.4 kg (109 lb) 49.3 kg (108 lb 11.2 oz)    Examination:  General exam: Appears calm and comfortable  Respiratory system: Clear to auscultation. Respiratory effort normal. Cardiovascular system: S1 & S2 heard, RRR. No JVD, II/VI systolic murmur hear best at LSB, rubs, gallops or clicks. No pedal edema. Gastrointestinal system: Abdomen is nondistended, nontender, No organomegaly or masses felt. Normal bowel sounds heard. Central nervous system: Alert and oriented. No focal neurological  deficits. Extremities: Symmetric 5 x 5 power. Skin: No rashes, lesions or ulcers Psychiatry: Judgement and insight appear normal. Mood & affect appropriate.     Data Reviewed: I have personally reviewed following labs and imaging studies  CBC:  Recent Labs Lab 03/27/16 0830 03/28/16 0557  WBC 1.4* 1.5*  NEUTROABS 1.0*  --   HGB 9.2* 8.9*  HCT 29.4* 27.2*  MCV 108.5* 103.8*  PLT 160 357*   Basic Metabolic Panel:  Recent Labs Lab 03/23/16 1400 03/27/16 0830 03/28/16 0557  NA  --  134* 133*  K  --  5.0 4.2  CL  --  90* 91*  CO2  --  24 32  GLUCOSE  --  167* 126*  BUN  --  40* 32*  CREATININE 0.60 1.71* 0.72  CALCIUM  --  9.2 9.1   GFR: Estimated Creatinine Clearance: 42.1 mL/min (by C-G formula based on SCr of 0.8 mg/dL). Liver Function Tests:  Recent Labs Lab 03/27/16 0830 03/28/16 0557 03/29/16 0546  AST 155* 83* 90*  ALT 81* 60* 61*  ALKPHOS 53 41 45  BILITOT 1.7* 1.7* 1.8*  PROT 7.0 5.8* 5.5*  ALBUMIN 3.3* 2.8* 2.6*    Recent Labs Lab 03/27/16 0830 03/28/16 0557  LIPASE 374* 778*   No results for input(s): AMMONIA in the last 168 hours. Coagulation Profile: No results for input(s): INR, PROTIME in the last 168 hours. Cardiac Enzymes: No results for input(s): CKTOTAL, CKMB, CKMBINDEX, TROPONINI in the last 168 hours. BNP (last 3 results) No results for input(s): PROBNP in the last 8760 hours. HbA1C: No results for input(s): HGBA1C in the last 72 hours. CBG: No results for input(s): GLUCAP in the last 168 hours. Lipid Profile: No results for input(s): CHOL, HDL, LDLCALC, TRIG, CHOLHDL, LDLDIRECT in the last 72 hours. Thyroid Function Tests: No results for input(s): TSH, T4TOTAL, FREET4, T3FREE, THYROIDAB in the last 72 hours. Anemia Panel: No results for input(s): VITAMINB12, FOLATE, FERRITIN, TIBC, IRON, RETICCTPCT in the last 72 hours. Sepsis Labs: No results for input(s): PROCALCITON, LATICACIDVEN in the last 168 hours.  No results  found for this or any previous visit (from the past 240 hour(s)).       Radiology Studies: No results found.      Scheduled Meds: . amLODipine  10 mg Oral Daily  . lipase/protease/amylase  72,000 Units Oral QID  . [START ON 03/30/2016] mercaptopurine  50 mg Oral Daily  . metoprolol succinate  25 mg Oral Daily  . piperacillin-tazobactam (ZOSYN)  IV  3.375 g Intravenous Q8H  . sodium chloride flush  3 mL Intravenous Q12H   Continuous Infusions: . sodium chloride 75 mL/hr at 03/28/16 2103     LOS: 2 days    Time spent: 30 minutes    Newman Pies, MD [facility] Pager [phone]  If 7PM-7AM, please contact night-coverage [email or web address]  Password [email or web address] 03/29/2016, 4:07 PM

## 2016-03-29 NOTE — Progress Notes (Signed)
[facility] labs were drawn yesterday per the lab. They will be sent out today via [employer]. Will usually take about one week, and then these will be resulted through our office.   Annitta Needs, ANP-BC [facility]

## 2016-03-30 ENCOUNTER — Other Ambulatory Visit: Payer: Self-pay

## 2016-03-30 ENCOUNTER — Telehealth: Payer: Self-pay

## 2016-03-30 DIAGNOSIS — D72819 Decreased white blood cell count, unspecified: Secondary | ICD-10-CM

## 2016-03-30 DIAGNOSIS — R1013 Epigastric pain: Secondary | ICD-10-CM

## 2016-03-30 DIAGNOSIS — K50119 Crohn's disease of large intestine with unspecified complications: Secondary | ICD-10-CM

## 2016-03-30 DIAGNOSIS — K858 Other acute pancreatitis without necrosis or infection: Secondary | ICD-10-CM

## 2016-03-30 MED ORDER — HYDROCODONE-ACETAMINOPHEN 5-325 MG PO TABS: 1.0000 | ORAL_TABLET | Freq: Four times a day (QID) | ORAL | 0 refills | Status: DC | PRN

## 2016-03-30 MED ORDER — PHENOL 1.4 % MT LIQD: 1.0000 | OROMUCOSAL | Status: DC | PRN

## 2016-03-30 MED ORDER — ACETAMINOPHEN ER 650 MG PO TBCR: 650.0000 mg | EXTENDED_RELEASE_TABLET | Freq: Three times a day (TID) | ORAL | 0 refills | Status: DC | PRN

## 2016-03-30 MED ORDER — PHENOL 1.4 % MT LIQD: 1.0000 | OROMUCOSAL | 0 refills | Status: DC | PRN

## 2016-03-30 NOTE — Telephone Encounter (Signed)
Please arrange for CBC with diff, LFTs in one week from discharge.

## 2016-03-30 NOTE — Progress Notes (Signed)
Patient's IV removed.  Site WNL.  AVS reviewed with patient.  Verbalized understanding of discharge instructions, physician follow-up, medications.  Prescription for pain medication given to patient, and patient verbalized understanding.  Patient reports belongings intact and in possession at time of discharge.  Patient stable and awaiting ride for discharge home.

## 2016-03-30 NOTE — Telephone Encounter (Signed)
Tried to call pt and phone is not working. Mailed the lab orders with a note to do in one week.

## 2016-03-30 NOTE — Telephone Encounter (Signed)
Attempted to call pt to inform of referral appt that was made with Dr. Arnoldo Morale (general surgeon) for 04/07/16 at 11:00 am. Unable to reach pt, mailed letter.

## 2016-03-30 NOTE — Discharge Instructions (Signed)
Low-Fat Diet for Pancreatitis or Gallbladder Conditions A low-fat diet can be helpful if you have pancreatitis or a gallbladder condition. With these conditions, your pancreas and gallbladder have trouble digesting fats. A healthy eating plan with less fat will help rest your pancreas and gallbladder and reduce your symptoms. WHAT DO I NEED TO KNOW ABOUT THIS DIET?  Eat a low-fat diet.  Reduce your fat intake to less than 20-30% of your total daily calories. This is less than 50-60 g of fat per day.  Remember that you need some fat in your diet. Ask your dietician what your daily goal should be.  Choose nonfat and low-fat healthy foods. Look for the words "nonfat," "low fat," or "fat free."  As a guide, look on the label and choose foods with less than 3 g of fat per serving. Eat only one serving.  Avoid alcohol.  Do not smoke. If you need help quitting, talk with your health care provider.  Eat small frequent meals instead of three large heavy meals. WHAT FOODS CAN I EAT? Grains Include healthy grains and starches such as potatoes, wheat bread, fiber-rich cereal, and brown rice. Choose whole grain options whenever possible. In adults, whole grains should account for 45-65% of your daily calories.  Fruits and Vegetables Eat plenty of fruits and vegetables. Fresh fruits and vegetables add fiber to your diet. Meats and Other Protein Sources Eat lean meat such as chicken and pork. Trim any fat off of meat before cooking it. Eggs, fish, and beans are other sources of protein. In adults, these foods should account for 10-35% of your daily calories. Dairy Choose low-fat milk and dairy options. Dairy includes fat and protein, as well as calcium.  Fats and Oils Limit high-fat foods such as fried foods, sweets, baked goods, sugary drinks.  Other Creamy sauces and condiments, such as mayonnaise, can add extra fat. Think about whether or not you need to use them, or use smaller amounts or low fat  options. WHAT FOODS ARE NOT RECOMMENDED?  High fat foods, such as:  [employer].  Ice cream.  [place] toast.  Sweet rolls.  Pizza.  Cheese bread.  Foods covered with batter, butter, creamy sauces, or cheese.  Fried foods.  Sugary drinks and desserts.  Foods that cause gas or bloating   This information is not intended to replace advice given to you by your health care provider. Make sure you discuss any questions you have with your health care provider.   Document Released: 07/09/2013 Document Reviewed: 07/09/2013 Elsevier Interactive Patient Education [employer].

## 2016-03-30 NOTE — Progress Notes (Signed)
Subjective:  Overall much improved. Tolerating full liquids. Still with some mild abdominal discomfort. 3-4 loose stools daily, baseline for her. 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 Does not feel like pancreatic enzymes are helping. Makes "diarrhea worse".   Complains of sore throat.  Objective: Vital signs in last 24 hours: Temp:  [98.4 F (36.9 C)-98.6 F (37 C)] 98.4 F (36.9 C) (09/13 0419) Pulse Rate:  [82-86] 85 (09/13 0419) Resp:  [20] 20 (09/13 0419) BP: (118-136)/(68-79) 130/69 (09/13 0419) SpO2:  [98 %-100 %] 98 % (09/13 0419) Last BM Date: 03/29/16 General:   Alert,  Well-developed, well-nourished, pleasant and cooperative in NAD Head:  Normocephalic and atraumatic. Eyes:  Sclera clear, no icterus.  Mouth: no erythema in throat, no evidence of thrush. Abdomen:  Soft, mild epig tenderness and nondistended.   Normal bowel sounds, without guarding, and without rebound.   Extremities:  Without clubbing, deformity or edema. Neurologic:  Alert and  oriented x4;  grossly normal neurologically. Skin:  Intact without significant lesions or rashes. Psych:  Alert and cooperative. Normal mood and affect.  Intake/Output from previous day: 09/12 0701 - 09/13 0700 In: 1400 [P.O.:600; I.V.:750; IV Piggyback:50] Out: 700 [Urine:700] Intake/Output this shift: No intake/output data recorded.  Lab Results: CBC  Recent Labs  03/28/16 0557  WBC 1.5*  HGB 8.9*  HCT 27.2*  MCV 103.8*  PLT 115*   BMET  Recent Labs  03/28/16 0557  NA 133*  K 4.2  CL 91*  CO2 32  GLUCOSE 126*  BUN 32*  CREATININE 0.72  CALCIUM 9.1   LFTs  Recent Labs  03/28/16 0557 03/29/16 0546  BILITOT 1.7* 1.8*  BILIDIR  --  0.5  IBILI  --  1.3*  ALKPHOS 41 45  AST 83* 90*  ALT 60* 61*  PROT 5.8* 5.5*  ALBUMIN 2.8* 2.6*    Recent Labs  03/28/16 0557  LIPASE 778*   PT/INR No results for input(s): LABPROT, INR in the last 72 hours.    Imaging  Studies: Ct Abdomen Pelvis Wo Contrast  Result Date: 03/27/2016 CLINICAL DATA:  Intermittent abdominal pain and vomiting for 2 weeks. History of Crohn's disease and pancreatitis. History of bowel resection and rectovaginal fistula closure. EXAM: CT ABDOMEN AND PELVIS WITHOUT CONTRAST TECHNIQUE: Multidetector CT imaging of the abdomen and pelvis was performed following the standard protocol without IV contrast. COMPARISON:  MRI abdomen 03/23/2016 FINDINGS: Lower chest: Patchy dependent atelectasis posteriorly within the lung bases. Tiny parenchymal calcifications in the posterior right lung base. Mild right middle and lower lobe subpleural interstitial opacities could relate to mild fibrosis. No acute consolidation or effusion. Heart size is slightly enlarged. Hepatobiliary: Liver size upper normal at 17 cm. Noncontrast appearance of the liver otherwise unremarkable. Gallbladder slightly prominent but no definite intraluminal stones are visualized. No wall thickening. Common bile duct is slightly tortuous and mildly prominent, measuring up to 8-9 mm; at the head of the pancreas, this measures 6 mm. It is grossly unchanged compared to recent MRI. Pancreatic duct at the head neck and proximal body appears slightly increased in size compared to MRI, it measures approximately 3 mm in diameter. No definite calcified stones are seen in the region of the common bile duct. Pancreas: No peripancreatic fluid collections. Pancreatic duct mildly prominent, measuring up to 3 mm. Spleen: Grossly normal non contrasted appearance. Adrenals/Urinary Tract: Adrenal glands within normal limits. No calcified stones or hydronephrosis involving the kidneys. 2.2 cm low-density lesion in the  mid right kidney, corresponding to the cyst described on MRI. Additional 9 mm hypodense lesion midpole right kidney. There is a 2.4 cm low-density lesion in the mid left kidney, corresponding to the cyst on MRI. Lobulated exophytic 3 cm hypodense  lesion from the upper pole of left kidney, corresponds to MRI cyst. Ureters are nondilated. The bladder is normal. Stomach/Bowel: There is mild gastric wall thickening with suboptimal intra luminal distention. There is no dilated small bowel. The appendix is not well identified. There are patchy areas of wall thickening involving the rectosigmoid colon. No evidence of a perforation. Few scattered colonic diverticula. Vascular/Lymphatic: Limited without intravenous contrast. Atherosclerotic vascular calcifications of the aorta. Scattered mesenteric and retroperitoneal subcentimeter lymph nodes. Reproductive: Uterus is not identified and presumably absent. Multiple calcified phleboliths. Other: Mild soft tissue stranding is present within the right upper quadrant near the fundus of the gallbladder. Musculoskeletal: Heterogenous attenuation of the bones could relate to a osteopenia. There is evidence of an old left inferior pubic ramus fracture. Mild SI joint sclerosis. IMPRESSION: 1. Slight dilatation of the gallbladder, no definite intraluminal stones or wall thickening. A small amount of soft tissue thickening/stranding is present in the right upper quadrant near the hepatic margin and adjacent to the gallbladder fundus, this is nonspecific. If signs or symptoms are referable to the gallbladder, further evaluation with ultrasound may be obtained. Common bile duct appears slightly enlarged ; pancreatic duct slightly more prominent compared to recent MRI. Suggest correlation with laboratory values. 2. Patchy wall thickening involving the rectosigmoid colon, could relate to history of Crohn's disease. No evidence for perforation or abscess. 3. Multiple hypodense lesions within the bilateral kidneys, corresponding to previously noted cysts on MRI. 4. Mild gastric wall thickening, likely related to underdistention although mild gastritis not excluded. 5. Old left inferior pubic ramus fracture. Electronically Signed   By:  Donavan Foil M.D.   On: 03/27/2016 11:17   Mr 3d Recon At Scanner  Result Date: 03/23/2016 CLINICAL DATA:  Idiopathic chronic pancreatitis. Chronic abdominal pain. Crohn's disease. EXAM: MRI ABDOMEN WITHOUT AND WITH CONTRAST (INCLUDING MRCP) TECHNIQUE: Multiplanar multisequence MR imaging of the abdomen was performed both before and after the administration of intravenous contrast. Heavily T2-weighted images of the biliary and pancreatic ducts were obtained, and three-dimensional MRCP images were rendered by post processing. CONTRAST:  62m MULTIHANCE GADOBENATE DIMEGLUMINE 529 MG/ML IV SOLN COMPARISON:  None. FINDINGS: Lower chest:  No acute findings. Hepatobiliary: No mass or other parenchymal abnormality identified. Gallbladder is unremarkable. No evidence of biliary ductal dilatation, with proximal common bile duct measuring 5-6 mm in diameter. No evidence of choledocholithiasis. Pancreas: No mass, inflammatory changes, or other parenchymal abnormality identified. No evidence of pancreatic ductal dilatation or pancreas divisum. Spleen:  Within normal limits in size and appearance. Adrenals/Urinary Tract: Normal adrenal glands. Bilateral benign appearing Bosniak category 1 and 2 renal cysts noted. No masses identified. No evidence of hydronephrosis. Stomach/Bowel: Visualized portions within the abdomen are unremarkable. Vascular/Lymphatic: No pathologically enlarged lymph nodes identified. No abdominal aortic aneurysm demonstrated. Other:  None. Musculoskeletal:  No suspicious bone lesions identified. IMPRESSION: No hepatobiliary abnormality identified. No evidence of biliary ductal dilatation or choledocholithiasis. No pancreatic abnormality identified. No evidence of pancreatic ductal dilatation or pancreas divisum. Electronically Signed   By: JEarle GellM.D.   On: 03/23/2016 16:08   Mr AJeananne RamaW/wo Cm/mrcp  Result Date: 03/23/2016 CLINICAL DATA:  Idiopathic chronic pancreatitis. Chronic abdominal pain.  Crohn's disease. EXAM: MRI ABDOMEN WITHOUT AND WITH CONTRAST (INCLUDING  MRCP) TECHNIQUE: Multiplanar multisequence MR imaging of the abdomen was performed both before and after the administration of intravenous contrast. Heavily T2-weighted images of the biliary and pancreatic ducts were obtained, and three-dimensional MRCP images were rendered by post processing. CONTRAST:  52m MULTIHANCE GADOBENATE DIMEGLUMINE 529 MG/ML IV SOLN COMPARISON:  None. FINDINGS: Lower chest:  No acute findings. Hepatobiliary: No mass or other parenchymal abnormality identified. Gallbladder is unremarkable. No evidence of biliary ductal dilatation, with proximal common bile duct measuring 5-6 mm in diameter. No evidence of choledocholithiasis. Pancreas: No mass, inflammatory changes, or other parenchymal abnormality identified. No evidence of pancreatic ductal dilatation or pancreas divisum. Spleen:  Within normal limits in size and appearance. Adrenals/Urinary Tract: Normal adrenal glands. Bilateral benign appearing Bosniak category 1 and 2 renal cysts noted. No masses identified. No evidence of hydronephrosis. Stomach/Bowel: Visualized portions within the abdomen are unremarkable. Vascular/Lymphatic: No pathologically enlarged lymph nodes identified. No abdominal aortic aneurysm demonstrated. Other:  None. Musculoskeletal:  No suspicious bone lesions identified. IMPRESSION: No hepatobiliary abnormality identified. No evidence of biliary ductal dilatation or choledocholithiasis. No pancreatic abnormality identified. No evidence of pancreatic ductal dilatation or pancreas divisum. Electronically Signed   By: JEarle GellM.D.   On: 03/23/2016 16:08  [2 weeks]   Assessment:  75year old female admitted with mild pancreatitis with etiologies likely secondary to microlithiasis in the setting of documented fluctuating transaminases. Med effect less likely. Imaging reviewed without obvious pancreatic lesion. Clinically improved,  tolerating full liquids. LFTs improved from admission, stable.   Weight loss: concerning. Will be referred to hematology as outpatient due to long-standing use of mercaptopurine, obvious CBC abnormalities. [facility] labs ordered to obtain metabolites (following up with lab today as this was a special send out). Remain on mercaptopurine for now but recommend dose adjustment due to abnormalities with repeat CBC in 1 week as outpatient.   Cholecystectomy to be pursued in the outpatient setting. She has had documented evidence of pancreatitis earlier this year (March 2017) at an outside facility. Not clear that pancreatic enzymes will be required for long-term use. Will continue at least short term, but likely discontinue after gb surgery. No evidence of chronic pancreatitis on imaging.   Diarrhea: baseline loose stools in setting of Crohn's. Patient feels like stools less frequent than her normal. Discussed with patient, if she feels like stools are worsening, would consider checking for C.diff given recent Augmentin use and she is at increased risk with baseline Crohn's and hospitalizations.   Plan: 1. Chloraseptic spray for sorethroat.  2. F/u pending labs.  3. Outpatient evaluation for cholecystectomy and hematology referral as outlined.  4. Mercaptopurine 547mdaily and repeat CBC in [redacted] week along with LFTs.  5. Will follow peripherally, from a GI standpoint consider discharge.  LeLaureen OchsLeBernarda CaffeyoChristus Santa [facility] [id number] AM     LOS: 3 days

## 2016-03-31 NOTE — Discharge Summary (Signed)
[facility] Discharge Summary   Patient: Tamara Todd FBP:[id number]   PCP: [name], NP DOB: 31-May-1941   Date of admission: 03/27/2016   Date of discharge: 03/30/2016   Discharge Diagnoses:  Active Problems:   Crohn's disease ([place])   Cholecystitis   Acute cholecystitis   Renal insufficiency   Abdominal pain   Leukopenia   Other acute pancreatitis   Admitted From: Home Disposition:  Home  Recommendations for Outpatient Follow-up:  1. Follow-up with PCP in one week. 2. Get CBC and LFTs checked 3. Follow-up with GI and get referral for hematology as well as follow-up with general surgery.  Follow-up Information    [name] .   Specialty:  Nurse Practitioner Why:  Follow-up appointment on Wednesday, April 06, 2016 at 11 a.m. with CBC with differential and LFT labs in one week Contact information: [place] [address] [phone]        [facility] .   Contact information: [place] [phone]       Jamesetta So, MD .   Specialty:  General Surgery Why:  Referral has been sent to Dr. Arnoldo Morale office by [facility] Contact information: [address] [place] [address] [phone]        [facility] .   Specialty:  Oncology Why:  Cancer center to call with referral appointment.  Referral sent to Hematology/Cancer Center by [facility]. Contact information: [address] [id number] mc [place] [address] [phone]         Diet recommendation: Low-fat soft diet  Activity: The patient is advised to gradually reintroduce usual activities.  Discharge Condition: good  Code Status: Full code  History of present illness: As per the H and P dictated on admission, " AALIA GREULICH is a 75 y.o. female with medical history significant of Crohn's Disease, Arthritis,  Hypertension, and DM Type II who presented to the ED with abdominal pain and just "not feeling right" for the past 3-4 days.  She reports symptoms began about 2-3 weeks ago.  Symptoms of abdominal pain, nausea increased in intensity 3-4 days ago though.  Last night she voices her pain intensified as did her nausea and ultimately she vomited 2 times.  She came in for evaluation secondary to her history of SBO and crohn's disease.  Pain is worst in the left upper quadrant but radiates across the abdomen to the epigastrium and right upper quadrant.  Denies fever at home.  Last bowel movement was yesterday.  Consistency was that of diarrhea but that is normal for patient.  Denies sick contacts, denies recent travel, denies chest pain, chest pressure, shortness of breath.  Has been eating and drinking significantly less than her normal for the past 3-4 days."  Hospital Course:  Summary of her active problems in the hospital is as following. Acute Pancreatitis. Multiple gallstones  Abnormal LFT - Gen Surgery consulted, no immediate surgical need at this time - Patient was initially started on IV antibiotics for suspected cholecystitis but after consultation with gastroenterology as well as general surgery the antibiotics were discontinued. Patient will follow up with GI as an outpatient as well as general surgery regarding further workup on the gallbladder which is the likely cause of patient's pancreatitis. Continue soft low-fat diet on discharge.  Crohn's Disease - continue patient's home medications of mercaptopurine - Due to leukopenia GI is recommending to decrease the dose of 6-MP to 50 mg  a day. Patient will get a repeat CBC in 1 week. Patient will get an outpatient hematology referral from GI on follow-up.  Acute Kidney Injury - previous creatinine normal - Creatinine on admission of 1.71 - Cr improved to WNL today  Anemia - Seems stable from previous lab draws - H/H stable this am -  etiology related to patients mercaptopurine - recent folate, B12, iron studies all WNL  Leukopenia - likely related to mercaptopurine  HTN - continue Norvasc and metoprolol - Stop ARB- HCTZ secondary to AKI as well as normal blood pressure despite holding this medication.   All other chronic medical condition were stable during the hospitalization.  Patient was ambulatory without any assistance. On the day of the discharge the patient's vitals were stable, and no other acute medical condition were reported by patient. the patient was felt safe to be discharge at home with family.  Procedures and Results:  none   Consultations:  General surgery.  Gastroenterology  DISCHARGE MEDICATION: Discharge Medication List as of 03/30/2016 10:53 AM    START taking these medications   Details  HYDROcodone-acetaminophen (NORCO) 5-325 MG tablet Take 1 tablet by mouth every 6 (six) hours as needed for moderate pain., Starting Wed 03/30/2016, Print    phenol (CHLORASEPTIC) 1.4 % LIQD Use as directed 1 spray in the mouth or throat as needed for throat irritation / pain., Starting Wed 03/30/2016, Normal      CONTINUE these medications which have CHANGED   Details  acetaminophen (TYLENOL) 650 MG CR tablet Take 1 tablet (650 mg total) by mouth every 8 (eight) hours as needed for pain., Starting Wed 03/30/2016, Normal    metFORMIN (GLUCOPHAGE) 500 MG tablet Take 1 tablet (500 mg total) by mouth 2 (two) times daily., Starting Mon 03/28/2016, No Print      CONTINUE these medications which have NOT CHANGED   Details  amLODipine (NORVASC) 10 MG tablet Take 10 mg by mouth daily., Starting 11/17/2015, Until Discontinued, Historical Med    calcium carbonate (OS-CAL - DOSED IN MG OF ELEMENTAL CALCIUM) 1250 (500 Ca) MG tablet Take 1 tablet by mouth 2 (two) times daily with a meal., Until Discontinued, Historical Med    cyanocobalamin (,VITAMIN B-12,) 1000 MCG/ML injection Inject 1 mL into the muscle every  14 (fourteen) days., Starting 01/08/2016, Until Discontinued, Historical Med    diclofenac (VOLTAREN) 75 MG EC tablet Take 75 mg by mouth daily., Historical Med    FeFum-FePo-FA-B Cmp-C-Zn-Mn-Cu (SE-TAN PLUS) 162-115.2-1 MG CAPS Take 1 capsule by mouth daily., Historical Med    lipase/protease/amylase (CREON) 36000 UNITS CPEP capsule Take 72,000 Units by mouth 4 (four) times daily. With meals and snack., Historical Med    mercaptopurine (PURINETHOL) 50 MG tablet Give on an empty stomach 1 hour before or 2 hours after meals. Caution: Chemotherapy., Normal    metoprolol succinate (TOPROL-XL) 25 MG 24 hr tablet Take 25 mg by mouth daily., Starting 10/09/2015, Until Discontinued, Historical Med    omeprazole (PRILOSEC) 20 MG capsule Take 20 mg by mouth daily., Starting 10/25/2015, Until Discontinued, Historical Med    Vitamin D, Ergocalciferol, (DRISDOL) 50000 units CAPS capsule Take 50,000 Units by mouth once a week. Takes on Mondays., Starting 09/24/2015, Until Discontinued, Historical Med      STOP taking these medications     amoxicillin-clavulanate (AUGMENTIN) 500-125 MG tablet      losartan-hydrochlorothiazide (HYZAAR) 50-12.5 MG tablet      magnesium oxide (MAG-OX) 400 (241.3 Mg) MG tablet  Allergies  Allergen Reactions  . Lisinopril     Cough   . Remicade [Infliximab] Other (See Comments)    Couldn't breath  . Codeine Rash   Discharge Instructions    Diet fat modified    Complete by:  As directed    Discharge instructions    Complete by:  As directed    It is important that you read following instructions as well as go over your medication list with RN to help you understand your care after this hospitalization.  Discharge Instructions: Please follow-up with PCP in one week  Please request your primary care physician to go over all Hospital Tests and Procedure/Radiological results at the follow up,  Please get all Hospital records sent to your PCP by signing  hospital release before you go home.   Do not take more than prescribed Pain, Sleep and Anxiety Medications. You were cared for by a hospitalist during your hospital stay. If you have any questions about your discharge medications or the care you received while you were in the hospital after you are discharged, you can call the unit and ask to speak with the hospitalist on call if the hospitalist that took care of you is not available.  Once you are discharged, your primary care physician will handle any further medical issues. Please note that NO REFILLS for any discharge medications will be authorized once you are discharged, as it is imperative that you return to your primary care physician (or establish a relationship with a primary care physician if you do not have one) for your aftercare needs so that they can reassess your need for medications and monitor your lab values. You Must read complete instructions/literature along with all the possible adverse reactions/side effects for all the Medicines you take and that have been prescribed to you. Take any new Medicines after you have completely understood and accept all the possible adverse reactions/side effects. Wear Seat belts while driving. If you have smoked or chewed Tobacco in the last 2 yrs please stop smoking and/or stop any Recreational drug use.   Increase activity slowly    Complete by:  As directed      Discharge Exam: Filed Weights   03/27/16 0744 03/27/16 0745 03/27/16 1357  Weight: 49.4 kg (109 lb) 49.4 kg (109 lb) 49.3 kg (108 lb 11.2 oz)   Vitals:   03/29/16 2022 03/30/16 0419  BP: 136/79 130/69  Pulse: 86 85  Resp: 20 20  Temp: 98.6 F (37 C) 98.4 F (36.9 C)   General: Appear in mild distress, no Rash; Oral Mucosa moist. Cardiovascular: S1 and S2 Present, no Murmur, no JVD Respiratory: Bilateral Air entry present and Clear to Auscultation, o Crackles, no wheezes Abdomen: Bowel Sound present, Soft and no  tenderness Extremities: no Pedal edema, no calf tenderness Neurology: Grossly no focal neuro deficit.  The results of significant diagnostics from this hospitalization (including imaging, microbiology, ancillary and laboratory) are listed below for reference.    Significant Diagnostic Studies: Ct Abdomen Pelvis Wo Contrast  Result Date: 03/27/2016 CLINICAL DATA:  Intermittent abdominal pain and vomiting for 2 weeks. History of Crohn's disease and pancreatitis. History of bowel resection and rectovaginal fistula closure. EXAM: CT ABDOMEN AND PELVIS WITHOUT CONTRAST TECHNIQUE: Multidetector CT imaging of the abdomen and pelvis was performed following the standard protocol without IV contrast. COMPARISON:  MRI abdomen 03/23/2016 FINDINGS: Lower chest: Patchy dependent atelectasis posteriorly within the lung bases. Tiny parenchymal calcifications in the posterior right lung base.  Mild right middle and lower lobe subpleural interstitial opacities could relate to mild fibrosis. No acute consolidation or effusion. Heart size is slightly enlarged. Hepatobiliary: Liver size upper normal at 17 cm. Noncontrast appearance of the liver otherwise unremarkable. Gallbladder slightly prominent but no definite intraluminal stones are visualized. No wall thickening. Common bile duct is slightly tortuous and mildly prominent, measuring up to 8-9 mm; at the head of the pancreas, this measures 6 mm. It is grossly unchanged compared to recent MRI. Pancreatic duct at the head neck and proximal body appears slightly increased in size compared to MRI, it measures approximately 3 mm in diameter. No definite calcified stones are seen in the region of the common bile duct. Pancreas: No peripancreatic fluid collections. Pancreatic duct mildly prominent, measuring up to 3 mm. Spleen: Grossly normal non contrasted appearance. Adrenals/Urinary Tract: Adrenal glands within normal limits. No calcified stones or hydronephrosis involving the  kidneys. 2.2 cm low-density lesion in the mid right kidney, corresponding to the cyst described on MRI. Additional 9 mm hypodense lesion midpole right kidney. There is a 2.4 cm low-density lesion in the mid left kidney, corresponding to the cyst on MRI. Lobulated exophytic 3 cm hypodense lesion from the upper pole of left kidney, corresponds to MRI cyst. Ureters are nondilated. The bladder is normal. Stomach/Bowel: There is mild gastric wall thickening with suboptimal intra luminal distention. There is no dilated small bowel. The appendix is not well identified. There are patchy areas of wall thickening involving the rectosigmoid colon. No evidence of a perforation. Few scattered colonic diverticula. Vascular/Lymphatic: Limited without intravenous contrast. Atherosclerotic vascular calcifications of the aorta. Scattered mesenteric and retroperitoneal subcentimeter lymph nodes. Reproductive: Uterus is not identified and presumably absent. Multiple calcified phleboliths. Other: Mild soft tissue stranding is present within the right upper quadrant near the fundus of the gallbladder. Musculoskeletal: Heterogenous attenuation of the bones could relate to a osteopenia. There is evidence of an old left inferior pubic ramus fracture. Mild SI joint sclerosis. IMPRESSION: 1. Slight dilatation of the gallbladder, no definite intraluminal stones or wall thickening. A small amount of soft tissue thickening/stranding is present in the right upper quadrant near the hepatic margin and adjacent to the gallbladder fundus, this is nonspecific. If signs or symptoms are referable to the gallbladder, further evaluation with ultrasound may be obtained. Common bile duct appears slightly enlarged ; pancreatic duct slightly more prominent compared to recent MRI. Suggest correlation with laboratory values. 2. Patchy wall thickening involving the rectosigmoid colon, could relate to history of Crohn's disease. No evidence for perforation or  abscess. 3. Multiple hypodense lesions within the bilateral kidneys, corresponding to previously noted cysts on MRI. 4. Mild gastric wall thickening, likely related to underdistention although mild gastritis not excluded. 5. Old left inferior pubic ramus fracture. Electronically Signed   By: Donavan Foil M.D.   On: 03/27/2016 11:17   Mr 3d Recon At Scanner  Result Date: 03/23/2016 CLINICAL DATA:  Idiopathic chronic pancreatitis. Chronic abdominal pain. Crohn's disease. EXAM: MRI ABDOMEN WITHOUT AND WITH CONTRAST (INCLUDING MRCP) TECHNIQUE: Multiplanar multisequence MR imaging of the abdomen was performed both before and after the administration of intravenous contrast. Heavily T2-weighted images of the biliary and pancreatic ducts were obtained, and three-dimensional MRCP images were rendered by post processing. CONTRAST:  48m MULTIHANCE GADOBENATE DIMEGLUMINE 529 MG/ML IV SOLN COMPARISON:  None. FINDINGS: Lower chest:  No acute findings. Hepatobiliary: No mass or other parenchymal abnormality identified. Gallbladder is unremarkable. No evidence of biliary ductal dilatation, with proximal  common bile duct measuring 5-6 mm in diameter. No evidence of choledocholithiasis. Pancreas: No mass, inflammatory changes, or other parenchymal abnormality identified. No evidence of pancreatic ductal dilatation or pancreas divisum. Spleen:  Within normal limits in size and appearance. Adrenals/Urinary Tract: Normal adrenal glands. Bilateral benign appearing Bosniak category 1 and 2 renal cysts noted. No masses identified. No evidence of hydronephrosis. Stomach/Bowel: Visualized portions within the abdomen are unremarkable. Vascular/Lymphatic: No pathologically enlarged lymph nodes identified. No abdominal aortic aneurysm demonstrated. Other:  None. Musculoskeletal:  No suspicious bone lesions identified. IMPRESSION: No hepatobiliary abnormality identified. No evidence of biliary ductal dilatation or choledocholithiasis. No  pancreatic abnormality identified. No evidence of pancreatic ductal dilatation or pancreas divisum. Electronically Signed   By: Earle Gell M.D.   On: 03/23/2016 16:08   Mr Jeananne Rama W/wo Cm/mrcp  Result Date: 03/23/2016 CLINICAL DATA:  Idiopathic chronic pancreatitis. Chronic abdominal pain. Crohn's disease. EXAM: MRI ABDOMEN WITHOUT AND WITH CONTRAST (INCLUDING MRCP) TECHNIQUE: Multiplanar multisequence MR imaging of the abdomen was performed both before and after the administration of intravenous contrast. Heavily T2-weighted images of the biliary and pancreatic ducts were obtained, and three-dimensional MRCP images were rendered by post processing. CONTRAST:  25m MULTIHANCE GADOBENATE DIMEGLUMINE 529 MG/ML IV SOLN COMPARISON:  None. FINDINGS: Lower chest:  No acute findings. Hepatobiliary: No mass or other parenchymal abnormality identified. Gallbladder is unremarkable. No evidence of biliary ductal dilatation, with proximal common bile duct measuring 5-6 mm in diameter. No evidence of choledocholithiasis. Pancreas: No mass, inflammatory changes, or other parenchymal abnormality identified. No evidence of pancreatic ductal dilatation or pancreas divisum. Spleen:  Within normal limits in size and appearance. Adrenals/Urinary Tract: Normal adrenal glands. Bilateral benign appearing Bosniak category 1 and 2 renal cysts noted. No masses identified. No evidence of hydronephrosis. Stomach/Bowel: Visualized portions within the abdomen are unremarkable. Vascular/Lymphatic: No pathologically enlarged lymph nodes identified. No abdominal aortic aneurysm demonstrated. Other:  None. Musculoskeletal:  No suspicious bone lesions identified. IMPRESSION: No hepatobiliary abnormality identified. No evidence of biliary ductal dilatation or choledocholithiasis. No pancreatic abnormality identified. No evidence of pancreatic ductal dilatation or pancreas divisum. Electronically Signed   By: JEarle GellM.D.   On: 03/23/2016 16:08     Microbiology: No results found for this or any previous visit (from the past 240 hour(s)).   Labs: CBC:  Recent Labs Lab 03/27/16 0830 03/28/16 0557  WBC 1.4* 1.5*  NEUTROABS 1.0*  --   HGB 9.2* 8.9*  HCT 29.4* 27.2*  MCV 108.5* 103.8*  PLT 160 1809   Basic Metabolic Panel:  Recent Labs Lab 03/27/16 0830 03/28/16 0557  NA 134* 133*  K 5.0 4.2  CL 90* 91*  CO2 24 32  GLUCOSE 167* 126*  BUN 40* 32*  CREATININE 1.71* 0.72  CALCIUM 9.2 9.1   Liver Function Tests:  Recent Labs Lab 03/27/16 0830 03/28/16 0557 03/29/16 0546  AST 155* 83* 90*  ALT 81* 60* 61*  ALKPHOS 53 41 45  BILITOT 1.7* 1.7* 1.8*  PROT 7.0 5.8* 5.5*  ALBUMIN 3.3* 2.8* 2.6*    Recent Labs Lab 03/27/16 0830 03/28/16 0557  LIPASE 374* 778*   Time spent: 30 minutes  Signed:  Alexxis Mackert  [facility] 03/30/2016 , 1:34 PM

## 2016-04-01 ENCOUNTER — Encounter (HOSPITAL_COMMUNITY): Payer: Self-pay | Admitting: Emergency Medicine

## 2016-04-01 ENCOUNTER — Inpatient Hospital Stay (HOSPITAL_COMMUNITY)
Admission: EM | Admit: 2016-04-01 | Discharge: 2016-04-05 | DRG: 385 | Disposition: A | Discharge status: Home / Self Care | Payer: Medicare Other | Attending: Internal Medicine | Admitting: Internal Medicine

## 2016-04-01 ENCOUNTER — Telehealth: Payer: Self-pay

## 2016-04-01 DIAGNOSIS — Z7984 Long term (current) use of oral hypoglycemic drugs: Secondary | ICD-10-CM | POA: Diagnosis not present

## 2016-04-01 DIAGNOSIS — E119 Type 2 diabetes mellitus without complications: Secondary | ICD-10-CM | POA: Diagnosis present

## 2016-04-01 DIAGNOSIS — K5090 Crohn's disease, unspecified, without complications: Secondary | ICD-10-CM | POA: Diagnosis present

## 2016-04-01 DIAGNOSIS — I10 Essential (primary) hypertension: Secondary | ICD-10-CM | POA: Diagnosis present

## 2016-04-01 DIAGNOSIS — K859 Acute pancreatitis without necrosis or infection, unspecified: Secondary | ICD-10-CM | POA: Diagnosis present

## 2016-04-01 DIAGNOSIS — M810 Age-related osteoporosis without current pathological fracture: Secondary | ICD-10-CM | POA: Diagnosis present

## 2016-04-01 DIAGNOSIS — K5000 Crohn's disease of small intestine without complications: Secondary | ICD-10-CM | POA: Diagnosis present

## 2016-04-01 DIAGNOSIS — D702 Other drug-induced agranulocytosis: Secondary | ICD-10-CM | POA: Diagnosis not present

## 2016-04-01 DIAGNOSIS — D509 Iron deficiency anemia, unspecified: Secondary | ICD-10-CM

## 2016-04-01 DIAGNOSIS — E785 Hyperlipidemia, unspecified: Secondary | ICD-10-CM | POA: Diagnosis present

## 2016-04-01 DIAGNOSIS — D649 Anemia, unspecified: Secondary | ICD-10-CM | POA: Diagnosis not present

## 2016-04-01 DIAGNOSIS — K50019 Crohn's disease of small intestine with unspecified complications: Secondary | ICD-10-CM | POA: Diagnosis not present

## 2016-04-01 DIAGNOSIS — D61810 Antineoplastic chemotherapy induced pancytopenia: Secondary | ICD-10-CM | POA: Diagnosis present

## 2016-04-01 DIAGNOSIS — D701 Agranulocytosis secondary to cancer chemotherapy: Secondary | ICD-10-CM | POA: Diagnosis present

## 2016-04-01 DIAGNOSIS — Z800 Family history of malignant neoplasm of digestive organs: Secondary | ICD-10-CM | POA: Diagnosis not present

## 2016-04-01 DIAGNOSIS — M6281 Muscle weakness (generalized): Secondary | ICD-10-CM

## 2016-04-01 DIAGNOSIS — D72819 Decreased white blood cell count, unspecified: Secondary | ICD-10-CM | POA: Diagnosis present

## 2016-04-01 DIAGNOSIS — D61818 Other pancytopenia: Secondary | ICD-10-CM | POA: Diagnosis not present

## 2016-04-01 DIAGNOSIS — K219 Gastro-esophageal reflux disease without esophagitis: Secondary | ICD-10-CM | POA: Diagnosis present

## 2016-04-01 DIAGNOSIS — R634 Abnormal weight loss: Secondary | ICD-10-CM | POA: Diagnosis not present

## 2016-04-01 DIAGNOSIS — T451X5A Adverse effect of antineoplastic and immunosuppressive drugs, initial encounter: Secondary | ICD-10-CM | POA: Diagnosis present

## 2016-04-01 DIAGNOSIS — R109 Unspecified abdominal pain: Secondary | ICD-10-CM | POA: Diagnosis present

## 2016-04-01 DIAGNOSIS — K858 Other acute pancreatitis without necrosis or infection: Secondary | ICD-10-CM | POA: Diagnosis not present

## 2016-04-01 LAB — COMPREHENSIVE METABOLIC PANEL
ALBUMIN: 2.8 g/dL — AB (ref 3.5–5.0)
ALK PHOS: 70 U/L (ref 38–126)
ALT: 87 U/L — AB (ref 14–54)
AST: 72 U/L — AB (ref 15–41)
Anion gap: 6 (ref 5–15)
BUN: 11 mg/dL (ref 6–20)
CALCIUM: 8.6 mg/dL — AB (ref 8.9–10.3)
CHLORIDE: 107 mmol/L (ref 101–111)
CO2: 25 mmol/L (ref 22–32)
CREATININE: 0.43 mg/dL — AB (ref 0.44–1.00)
GFR calc non Af Amer: >60 mL/min (ref >60)
GLUCOSE: 193 mg/dL — AB (ref 65–99)
Potassium: 3.8 mmol/L (ref 3.5–5.1)
SODIUM: 138 mmol/L (ref 135–145)
Total Bilirubin: 0.5 mg/dL (ref 0.3–1.2)
Total Protein: 6 g/dL — ABNORMAL LOW (ref 6.5–8.1)

## 2016-04-01 LAB — CBC WITH DIFFERENTIAL/PLATELET
BASOS PCT: 0 %
Basophils Absolute: 0 10*3/uL (ref 0.0–0.1)
EOS ABS: 0.1 10*3/uL (ref 0.0–0.7)
EOS PCT: 6 %
HCT: 22.9 % — ABNORMAL LOW (ref 36.0–46.0)
HEMOGLOBIN: 7.7 g/dL — AB (ref 12.0–15.0)
Lymphocytes Relative: 54 %
Lymphs Abs: 0.6 10*3/uL — ABNORMAL LOW (ref 0.7–4.0)
MCH: 34.7 pg — ABNORMAL HIGH (ref 26.0–34.0)
MCHC: 33.6 g/dL (ref 30.0–36.0)
MCV: 103.2 fL — ABNORMAL HIGH (ref 78.0–100.0)
MONOS PCT: 8 %
Monocytes Absolute: 0.1 10*3/uL (ref 0.1–1.0)
NEUTROS PCT: 33 %
Neutro Abs: 0.4 10*3/uL — ABNORMAL LOW (ref 1.7–7.7)
PLATELETS: 145 10*3/uL — AB (ref 150–400)
RBC: 2.22 MIL/uL — ABNORMAL LOW (ref 3.87–5.11)
RDW: 19.9 % — ABNORMAL HIGH (ref 11.5–15.5)
WBC: 1.1 10*3/uL — CL (ref 4.0–10.5)

## 2016-04-01 LAB — URINALYSIS, ROUTINE W REFLEX MICROSCOPIC
BILIRUBIN URINE: NEGATIVE
Glucose, UA: >1000 mg/dL — AB
KETONES UR: NEGATIVE mg/dL
Leukocytes, UA: NEGATIVE
NITRITE: NEGATIVE
PROTEIN: 30 mg/dL — AB
SPECIFIC GRAVITY, URINE: 1.02 (ref 1.005–1.030)
pH: 5.5 (ref 5.0–8.0)

## 2016-04-01 LAB — URINE MICROSCOPIC-ADD ON

## 2016-04-01 LAB — GLUCOSE, CAPILLARY: Glucose-Capillary: 133 mg/dL — ABNORMAL HIGH (ref 65–99)

## 2016-04-01 LAB — C DIFFICILE QUICK SCREEN W PCR REFLEX
C DIFFICLE (CDIFF) ANTIGEN: NEGATIVE
C Diff interpretation: NOT DETECTED
C Diff toxin: NEGATIVE

## 2016-04-01 LAB — LIPASE, BLOOD: LIPASE: 258 U/L — AB (ref 11–51)

## 2016-04-01 MED ORDER — FENTANYL CITRATE (PF) 100 MCG/2ML IJ SOLN
50.0000 ug | Freq: Once | INTRAMUSCULAR | Status: AC
  Administered 2016-04-01: 50 ug via INTRAVENOUS
  Filled 2016-04-01: qty 2

## 2016-04-01 MED ORDER — KCL IN DEXTROSE-NACL 20-5-0.9 MEQ/L-%-% IV SOLN
INTRAVENOUS | Status: DC
  Administered 2016-04-01 – 2016-04-05 (×4): via INTRAVENOUS

## 2016-04-01 MED ORDER — METRONIDAZOLE IN NACL 5-0.79 MG/ML-% IV SOLN
500.0000 mg | Freq: Three times a day (TID) | INTRAVENOUS | Status: DC
  Administered 2016-04-01 – 2016-04-05 (×10): 500 mg via INTRAVENOUS
  Filled 2016-04-01 (×10): qty 100

## 2016-04-01 MED ORDER — CYANOCOBALAMIN 1000 MCG/ML IJ SOLN
1000.0000 ug | INTRAMUSCULAR | Status: DC
  Administered 2016-04-02: 1000 ug via INTRAMUSCULAR
  Filled 2016-04-01: qty 1

## 2016-04-01 MED ORDER — INSULIN ASPART 100 UNIT/ML ~~LOC~~ SOLN
0.0000 [IU] | SUBCUTANEOUS | Status: DC
  Administered 2016-04-02 (×5): 2 [IU] via SUBCUTANEOUS
  Administered 2016-04-03 (×3): 3 [IU] via SUBCUTANEOUS
  Administered 2016-04-04 (×4): 2 [IU] via SUBCUTANEOUS
  Administered 2016-04-04 – 2016-04-05 (×2): 3 [IU] via SUBCUTANEOUS

## 2016-04-01 MED ORDER — AMLODIPINE BESYLATE 5 MG PO TABS
10.0000 mg | ORAL_TABLET | Freq: Every day | ORAL | Status: DC
  Administered 2016-04-02 – 2016-04-05 (×4): 10 mg via ORAL
  Filled 2016-04-01 (×4): qty 2

## 2016-04-01 MED ORDER — METOPROLOL SUCCINATE ER 25 MG PO TB24
25.0000 mg | ORAL_TABLET | Freq: Every day | ORAL | Status: DC
  Administered 2016-04-02 – 2016-04-05 (×4): 25 mg via ORAL
  Filled 2016-04-01 (×4): qty 1

## 2016-04-01 MED ORDER — MERCAPTOPURINE 50 MG PO TABS: 25.0000 mg | ORAL_TABLET | Freq: Every day | ORAL | Status: DC

## 2016-04-01 MED ORDER — ONDANSETRON HCL 4 MG/2ML IJ SOLN: 4.0000 mg | Freq: Once | INTRAMUSCULAR | Status: DC

## 2016-04-01 MED ORDER — FOLIC ACID 5 MG/ML IJ SOLN
5.0000 mg | Freq: Every day | INTRAMUSCULAR | Status: DC
  Administered 2016-04-02 – 2016-04-05 (×4): 5 mg via INTRAVENOUS
  Filled 2016-04-01 (×5): qty 1

## 2016-04-01 MED ORDER — SODIUM CHLORIDE 0.9 % IV BOLUS (SEPSIS)
1000.0000 mL | Freq: Once | INTRAVENOUS | Status: AC
  Administered 2016-04-01: 1000 mL via INTRAVENOUS

## 2016-04-01 MED ORDER — ACETAMINOPHEN 325 MG PO TABS
650.0000 mg | ORAL_TABLET | Freq: Three times a day (TID) | ORAL | Status: DC | PRN
  Administered 2016-04-02: 650 mg via ORAL
  Filled 2016-04-01: qty 2

## 2016-04-01 MED ORDER — SE-TAN PLUS 162-115.2-1 MG PO CAPS: 1.0000 | ORAL_CAPSULE | Freq: Every day | ORAL | Status: DC

## 2016-04-01 MED ORDER — CIPROFLOXACIN IN D5W 400 MG/200ML IV SOLN
400.0000 mg | Freq: Two times a day (BID) | INTRAVENOUS | Status: DC
  Administered 2016-04-01 – 2016-04-04 (×7): 400 mg via INTRAVENOUS
  Filled 2016-04-01 (×7): qty 200

## 2016-04-01 MED ORDER — SODIUM CHLORIDE 0.9 % IV SOLN
Freq: Once | INTRAVENOUS | Status: AC
  Administered 2016-04-02: 02:00:00 via INTRAVENOUS

## 2016-04-01 MED ORDER — HEPARIN SODIUM (PORCINE) 5000 UNIT/ML IJ SOLN
5000.0000 [IU] | Freq: Three times a day (TID) | INTRAMUSCULAR | Status: DC
  Administered 2016-04-02 – 2016-04-05 (×10): 5000 [IU] via SUBCUTANEOUS
  Filled 2016-04-01 (×10): qty 1

## 2016-04-01 NOTE — ED Triage Notes (Signed)
Patient brought in via EMS from home. Patient c/o abd pain and generalized fatigue with diarrhea. Per patient seen and admitted on Tuesday for observation due to same symptoms. Patient states that she felt better after being released from hospital but symptoms returned this morning. Patient states she was told questionable gallstones. Patient states pain and diarrhea starts after eating. Denies any nausea, vomiting, or fevers.

## 2016-04-01 NOTE — ED Notes (Signed)
Pt denies nausea at this time.

## 2016-04-01 NOTE — Telephone Encounter (Signed)
[facility] [phone]) number given by sister to inform of appt with Dr. Arnoldo Morale 04/07/16 at 11:00 am.

## 2016-04-01 NOTE — ED Provider Notes (Signed)
Tamara Todd   CSN: [id number] Arrival date & time: 04/01/16  1745     History   Chief Complaint Chief Complaint  Patient presents with  . Abdominal Pain    HPI Tamara Todd is a 75 y.o. female.  The patient is a 75 year old female, she has a medical history that is significant for a recent admission to the hospital for what appeared to be acute pancreatitis during which time the patient was evaluated with a CT scan and blood work, the CT scan did not show any signs of primary biliary abnormalities, there was some gallstones but no signs of acute cholecystitis area and her lipase was elevated consistent with pancreatitis. The patient was also noted to be in an acute kidney injury as well as having some elevated liver function tests. She was hydrated, her medications were reduced including mercaptopurine and metformin. The patient states that she has had diarrhea for 6 months, over the last week and had increased, her diarrhea persisted throughout her emergency department and hospital stay and has persisted in the outpatient setting. Her abdominal pain is improved but is still present in the epigastrium and right upper quadrant. She presents to the hospital today 2 days after being released because of ongoing upper abdominal pain and ongoing diarrhea. She has over 10 bowel movements a day, describes this as watery and green, denies fevers chills or vomiting but does have some nausea. She notes that eating or drinking makes her symptoms worse especially the diarrhea. She has been taking hydrocodone for pain with success.  While the patient was in the hospital she had consultation with general surgery, it was recommended that she not need acute surgery but further workup in the outpatient setting  During the hospital stay the patient did not undergo any testing of her stools as far as I can tell, she did have an identifiable anemia which was chronic and a new leukopenia  thought to be related to the mercaptopurine    Abdominal Pain      Past Medical History:  Diagnosis Date  . Crohn's disease ([place])    used to be followed by Dr. Algis Greenhouse. diagnosis in her 26s. multiple surgeries. remicade allergy.  . Diabetes ([place])   . GERD (gastroesophageal reflux disease)   . HTN (hypertension)   . Hyperlipidemia   . Hypertension   . Osteoporosis       . Pancreatitis    09/2015    Patient Active Problem List   Diagnosis Date Noted  . Other acute pancreatitis   . Abdominal pain   . Leukopenia   . Cholecystitis 03/27/2016  . Acute cholecystitis 03/27/2016  . Renal insufficiency 03/27/2016  . Crohn's disease ([place]) 12/08/2015    Past Surgical History:  Procedure Laterality Date  . BACTERIAL OVERGROWTH TEST N/A 02/24/2016   POSITIVE for small bowel bacterial overgrowth   . BIOPSY  02/10/2016   Procedure: BIOPSY;  Surgeon: Danie Binder, MD;  [facility];  Service: Endoscopy;;  Random colon biopsies Gastric biopsies for h pylori gastric polyp biopsies  . BOWEL RESECTION     TWICE for stricture, including right colectomy with terminal ileal resection  . COLONOSCOPY    . COLONOSCOPY N/A 02/10/2016   Dr. Oneida Alar: normal neo terminal ileum, diverticula in sigmoid colon, non-bleeding internal hemorrhoids, adenomatous rectal polyp.   . ESOPHAGOGASTRODUODENOSCOPY N/A 02/10/2016   Dr. Oneida Alar: chronic gastritis, small hiatal hernia, few benign-appearing gastric polyps, no source for weight loss.   Marland Kitchen  PARTIAL HYSTERECTOMY    . POLYPECTOMY  02/10/2016   Procedure: POLYPECTOMY;  Surgeon: Danie Binder, MD;  [facility];  Service: Endoscopy;;  Rectal polyps x 2 removed 1 by hot snare and number 2 via cold forceps  . RECTOVAGINAL FISTULA CLOSURE  2004   with temporary colostomy and subsequent take down, all at [facility].     OB History    No data available       Home Medications    Prior to Admission medications   Medication Sig Start Date  End Date Taking? Authorizing Provider  acetaminophen (TYLENOL) 650 MG CR tablet Take 1 tablet (650 mg total) by mouth every 8 (eight) hours as needed for pain. 03/30/16  Yes Lavina Hamman, MD  amLODipine (NORVASC) 10 MG tablet Take 10 mg by mouth daily. 11/17/15  Yes Historical Provider, MD  calcium carbonate (OS-CAL - DOSED IN MG OF ELEMENTAL CALCIUM) 1250 (500 Ca) MG tablet Take 1 tablet by mouth 2 (two) times daily with a meal.   Yes Historical Provider, MD  cyanocobalamin (,VITAMIN B-12,) 1000 MCG/ML injection Inject 1 mL into the muscle every 14 (fourteen) days. 01/08/16  Yes Historical Provider, MD  diclofenac (VOLTAREN) 75 MG EC tablet Take 75 mg by mouth daily.   Yes Historical Provider, MD  FeFum-FePo-FA-B Cmp-C-Zn-Mn-Cu (SE-TAN PLUS) 162-115.2-1 MG CAPS Take 1 capsule by mouth daily.   Yes Historical Provider, MD  HYDROcodone-acetaminophen (NORCO) 5-325 MG tablet Take 1 tablet by mouth every 6 (six) hours as needed for moderate pain. 03/30/16  Yes Lavina Hamman, MD  lipase/protease/amylase (CREON) 36000 UNITS CPEP capsule Take 72,000 Units by mouth 4 (four) times daily. With meals and snack.   Yes Historical Provider, MD  mercaptopurine (PURINETHOL) 50 MG tablet Give on an empty stomach 1 hour before or 2 hours after meals. Caution: Chemotherapy. Patient taking differently: Take 75 mg by mouth daily. Give on an empty stomach 1 hour before or 2 hours after meals. Caution: Chemotherapy. 02/10/16  Yes Danie Binder, MD  metFORMIN (GLUCOPHAGE) 500 MG tablet Take 1 tablet (500 mg total) by mouth 2 (two) times daily. 03/28/16  Yes Wallis Bamberg, MD  metoprolol succinate (TOPROL-XL) 25 MG 24 hr tablet Take 25 mg by mouth daily. 10/09/15  Yes Historical Provider, MD  omeprazole (PRILOSEC) 20 MG capsule Take 20 mg by mouth daily. 10/25/15  Yes Historical Provider, MD  phenol (CHLORASEPTIC) 1.4 % LIQD Use as directed 1 spray in the mouth or throat as needed for throat irritation / pain. 03/30/16  Yes  Lavina Hamman, MD  Vitamin D, Ergocalciferol, (DRISDOL) 50000 units CAPS capsule Take 50,000 Units by mouth once a week. Takes on Mondays. 09/24/15  Yes Historical Provider, MD    Family History Family History  Problem Relation Age of Onset  . Colon cancer Father     62s  . Inflammatory bowel disease Sister     ???    Social History Social History  Substance Use Topics  . Smoking status: Never Smoker  . Smokeless tobacco: Never Used  . Alcohol use No     Allergies   Lisinopril; Remicade [infliximab]; and Codeine   Review of Systems Review of Systems  Gastrointestinal: Positive for abdominal pain.  All other systems reviewed and are negative.   Physical Exam Updated Vital Signs BP 127/89 (BP Location: Right Arm)   Pulse 76   Temp 98.6 F (37 C) (Oral)   Resp 17   Ht 4'  11" (1.499 m)   Wt 108 lb (49 kg)   SpO2 100%   BMI 21.81 kg/m   Physical Exam  Constitutional: She appears well-developed and well-nourished. No distress.  HENT:  Head: Normocephalic and atraumatic.  Mouth/Throat: Oropharynx is clear and moist. No oropharyngeal exudate.  Eyes: Conjunctivae and EOM are normal. Pupils are equal, round, and reactive to light. Right eye exhibits no discharge. Left eye exhibits no discharge. No scleral icterus.  Neck: Normal range of motion. Neck supple. No JVD present. No thyromegaly present.  Cardiovascular: Normal rate, regular rhythm, normal heart sounds and intact distal pulses.  Exam reveals no gallop and no friction rub.   No murmur heard. Pulmonary/Chest: Effort normal and breath sounds normal. No respiratory distress. She has no wheezes. She has no rales.  Abdominal: Soft. Bowel sounds are normal. She exhibits no distension and no mass. There is tenderness ( ttp in the epigastrium and RUQ).  Musculoskeletal: Normal range of motion. She exhibits no edema or tenderness.  Lymphadenopathy:    She has no cervical adenopathy.  Neurological: She is alert.  Coordination normal.  Skin: Skin is warm and dry. No rash noted. No erythema.  Psychiatric: She has a normal mood and affect. Her behavior is normal.  Nursing Todd and vitals reviewed.    ED Treatments / Results  Labs (all labs ordered are listed, but only abnormal results are displayed) Labs Reviewed  LIPASE, BLOOD - Abnormal; Notable for the following:       Result Value   Lipase 258 (*)    All other components within normal limits  COMPREHENSIVE METABOLIC PANEL - Abnormal; Notable for the following:    Glucose, Bld 193 (*)    Creatinine, Ser 0.43 (*)    Calcium 8.6 (*)    Total Protein 6.0 (*)    Albumin 2.8 (*)    AST 72 (*)    ALT 87 (*)    All other components within normal limits  CBC WITH DIFFERENTIAL/PLATELET - Abnormal; Notable for the following:    WBC 1.1 (*)    RBC 2.22 (*)    Hemoglobin 7.7 (*)    HCT 22.9 (*)    MCV 103.2 (*)    MCH 34.7 (*)    RDW 19.9 (*)    Platelets 145 (*)    Neutro Abs 0.4 (*)    Lymphs Abs 0.6 (*)    All other components within normal limits  URINALYSIS, ROUTINE W REFLEX MICROSCOPIC (NOT AT [facility]) - Abnormal; Notable for the following:    Glucose, UA >1000 (*)    Hgb urine dipstick MODERATE (*)    Protein, ur 30 (*)    All other components within normal limits  URINE MICROSCOPIC-ADD ON - Abnormal; Notable for the following:    Squamous Epithelial / LPF 6-30 (*)    Bacteria, UA MANY (*)    All other components within normal limits  C DIFFICILE QUICK SCREEN W PCR REFLEX    Radiology No results found.  Procedures Procedures (including critical care time)  Medications Ordered in ED Medications  ondansetron (ZOFRAN) injection 4 mg (0 mg Intravenous Hold 04/01/16 1858)  sodium chloride 0.9 % bolus 1,000 mL (0 mLs Intravenous Stopped 04/01/16 2023)  fentaNYL (SUBLIMAZE) injection 50 mcg (50 mcg Intravenous Given 04/01/16 2034)    Initial Impression / Assessment and Plan / ED Course  I have reviewed the triage vital signs and  the nursing notes.  Pertinent labs & imaging results that were available  during my care of the patient were reviewed by me and considered in my medical decision making (see chart for details).  Labs reviewed Anemia worse WBC 1.1 - worse  Admitted by Dr. Marin Comment  Clinical Course     Final Clinical Impressions(s) / ED Diagnoses   Final diagnoses:  Anemia, unspecified anemia type  Leukopenia    New Prescriptions New Prescriptions   No medications on file     Noemi Chapel, MD 04/01/16 2100

## 2016-04-01 NOTE — ED Notes (Signed)
CRITICAL VALUE ALERT  Critical value received:  WBC  1.1  Date of notification:  04/01/16  Time of notification:  1937  Critical value read back:Yes.    Nurse who received alert:  Derek Mound, RN  MD notified (1st page):  Sabra Heck   Time of first page:  1937  MD notified (2nd page):  Time of second page:  Responding MD:  Sabra Heck  Time MD responded:  [phone]

## 2016-04-01 NOTE — H&P (Signed)
History and Physical    Tamara Todd HMC:[id number] DOB: 1940-10-18 DOA: 04/01/2016  PCP: [name], NP   Patient coming from: Home  Chief Complaint: Abdominal pain  HPI: Tamara Todd is a 75 y.o. female with medical history significant of Crohn's disease, GERD, HTN, HLD, and DM presents to the ED with complaints of upper abdominal pain and excessive diarrhea. Patient also reports generalized weakness and chills. She was recently hospitalized for similar symptoms. She is currently taking mercaptopurine for Crohn's disease that the dose was lowered as it was felt to be causing leukopenia. She was discharged 2 days ago, with plans to follow up with heme/onc outpatient.  She returned with peristent diarhrea and weakness.  She currently lives at home with her husband and son.  ED Course: While in the ED, glucose 193, creatinine 0.43, lipase 258, AST 72, ALT 87, WBC 1.1, hemoglobin 7.7, MCH 34.7, and MCV 103.2. Patient was admitted for further evaluation of abdominal pain.    Review of Systems: As per HPI otherwise 10 point review of systems negative.    Past Medical History:  Diagnosis Date  . Crohn's disease ([place])    used to be followed by Dr. Algis Greenhouse. diagnosis in her 41s. multiple surgeries. remicade allergy.  . Diabetes ([place])   . GERD (gastroesophageal reflux disease)   . HTN (hypertension)   . Hyperlipidemia   . Hypertension   . Osteoporosis       . Pancreatitis    09/2015    Past Surgical History:  Procedure Laterality Date  . BACTERIAL OVERGROWTH TEST N/A 02/24/2016   POSITIVE for small bowel bacterial overgrowth   . BIOPSY  02/10/2016   Procedure: BIOPSY;  Surgeon: Danie Binder, MD;  [facility];  Service: Endoscopy;;  Random colon biopsies Gastric biopsies for h pylori gastric polyp biopsies  . BOWEL RESECTION     TWICE for stricture, including right colectomy with terminal ileal resection  . COLONOSCOPY    . COLONOSCOPY N/A 02/10/2016   Dr. Oneida Alar:  normal neo terminal ileum, diverticula in sigmoid colon, non-bleeding internal hemorrhoids, adenomatous rectal polyp.   . ESOPHAGOGASTRODUODENOSCOPY N/A 02/10/2016   Dr. Oneida Alar: chronic gastritis, small hiatal hernia, few benign-appearing gastric polyps, no source for weight loss.   Marland Kitchen PARTIAL HYSTERECTOMY    . POLYPECTOMY  02/10/2016   Procedure: POLYPECTOMY;  Surgeon: Danie Binder, MD;  [facility];  Service: Endoscopy;;  Rectal polyps x 2 removed 1 by hot snare and number 2 via cold forceps  . RECTOVAGINAL FISTULA CLOSURE  2004   with temporary colostomy and subsequent take down, all at [facility].      reports that she has never smoked. She has never used smokeless tobacco. She reports that she does not drink alcohol or use drugs.  Allergies  Allergen Reactions  . Lisinopril     Cough   . Remicade [Infliximab] Other (See Comments)    Couldn't breath  . Codeine Rash    Family History  Problem Relation Age of Onset  . Colon cancer Father     32s  . Inflammatory bowel disease Sister     ???    Prior to Admission medications   Medication Sig Start Date End Date Taking? Authorizing Provider  acetaminophen (TYLENOL) 650 MG CR tablet Take 1 tablet (650 mg total) by mouth every 8 (eight) hours as needed for pain. 03/30/16  Yes Lavina Hamman, MD  amLODipine (NORVASC) 10 MG tablet Take 10 mg  by mouth daily. 11/17/15  Yes Historical Provider, MD  calcium carbonate (OS-CAL - DOSED IN MG OF ELEMENTAL CALCIUM) 1250 (500 Ca) MG tablet Take 1 tablet by mouth 2 (two) times daily with a meal.   Yes Historical Provider, MD  cyanocobalamin (,VITAMIN B-12,) 1000 MCG/ML injection Inject 1 mL into the muscle every 14 (fourteen) days. 01/08/16  Yes Historical Provider, MD  diclofenac (VOLTAREN) 75 MG EC tablet Take 75 mg by mouth daily.   Yes Historical Provider, MD  FeFum-FePo-FA-B Cmp-C-Zn-Mn-Cu (SE-TAN PLUS) 162-115.2-1 MG CAPS Take 1 capsule by mouth daily.   Yes Historical Provider, MD    HYDROcodone-acetaminophen (NORCO) 5-325 MG tablet Take 1 tablet by mouth every 6 (six) hours as needed for moderate pain. 03/30/16  Yes Lavina Hamman, MD  lipase/protease/amylase (CREON) 36000 UNITS CPEP capsule Take 72,000 Units by mouth 4 (four) times daily. With meals and snack.   Yes Historical Provider, MD  mercaptopurine (PURINETHOL) 50 MG tablet Give on an empty stomach 1 hour before or 2 hours after meals. Caution: Chemotherapy. Patient taking differently: Take 75 mg by mouth daily. Give on an empty stomach 1 hour before or 2 hours after meals. Caution: Chemotherapy. 02/10/16  Yes Danie Binder, MD  metFORMIN (GLUCOPHAGE) 500 MG tablet Take 1 tablet (500 mg total) by mouth 2 (two) times daily. 03/28/16  Yes Wallis Bamberg, MD  metoprolol succinate (TOPROL-XL) 25 MG 24 hr tablet Take 25 mg by mouth daily. 10/09/15  Yes Historical Provider, MD  omeprazole (PRILOSEC) 20 MG capsule Take 20 mg by mouth daily. 10/25/15  Yes Historical Provider, MD  phenol (CHLORASEPTIC) 1.4 % LIQD Use as directed 1 spray in the mouth or throat as needed for throat irritation / pain. 03/30/16  Yes Lavina Hamman, MD  Vitamin D, Ergocalciferol, (DRISDOL) 50000 units CAPS capsule Take 50,000 Units by mouth once a week. Takes on Mondays. 09/24/15  Yes Historical Provider, MD    Physical Exam: Vitals:   04/01/16 1749 04/01/16 2004  BP: 138/74 127/89  Pulse: 90 76  Resp: 18 17  Temp: 98.6 F (37 C)   TempSrc: Oral   SpO2: 100% 100%  Weight: 49 kg (108 lb)   Height: 4' 11"  (1.499 m)     Constitutional: NAD, calm, comfortable Vitals:   04/01/16 1749 04/01/16 2004  BP: 138/74 127/89  Pulse: 90 76  Resp: 18 17  Temp: 98.6 F (37 C)   TempSrc: Oral   SpO2: 100% 100%  Weight: 49 kg (108 lb)   Height: 4' 11"  (1.499 m)    Eyes: PERRL, lids and conjunctivae normal ENMT: Mucous membranes are moist. Posterior pharynx clear of any exudate or lesions.Normal dentition.  Neck: normal, supple, no masses, no  thyromegaly Respiratory: clear to auscultation bilaterally, no wheezing, no crackles. Normal respiratory effort. No accessory muscle use.  Cardiovascular: Regular rate and rhythm, no murmurs / rubs / gallops. No extremity edema. 2+ pedal pulses. No carotid bruits.  Abdomen: no tenderness, no masses palpated. No hepatosplenomegaly. Bowel sounds positive.  Musculoskeletal: no clubbing / cyanosis. No joint deformity upper and lower extremities. Good ROM, no contractures. Normal muscle tone.  Skin: no rashes, lesions, ulcers. No induration Neurologic: CN 2-12 grossly intact. Sensation intact, DTR normal. Strength 5/5 in all 4.  Psychiatric: Normal judgment and insight. Alert and oriented x 3. Normal mood.    Labs on Admission: I have personally reviewed following labs and imaging studies  CBC:  Recent Labs Lab 03/27/16 0830 03/28/16 0557  04/01/16 1901  WBC 1.4* 1.5* 1.1*  NEUTROABS 1.0*  --  0.4*  HGB 9.2* 8.9* 7.7*  HCT 29.4* 27.2* 22.9*  MCV 108.5* 103.8* 103.2*  PLT 160 115* 122*   Basic Metabolic Panel:  Recent Labs Lab 03/27/16 0830 03/28/16 0557 04/01/16 1901  NA 134* 133* 138  K 5.0 4.2 3.8  CL 90* 91* 107  CO2 24 32 25  GLUCOSE 167* 126* 193*  BUN 40* 32* 11  CREATININE 1.71* 0.72 0.43*  CALCIUM 9.2 9.1 8.6*   GFR: Estimated Creatinine Clearance: 42.1 mL/min (by C-G formula based on SCr of 0.43 mg/dL (L)). Liver Function Tests:  Recent Labs Lab 03/27/16 0830 03/28/16 0557 03/29/16 0546 04/01/16 1901  AST 155* 83* 90* 72*  ALT 81* 60* 61* 87*  ALKPHOS 53 41 45 70  BILITOT 1.7* 1.7* 1.8* 0.5  PROT 7.0 5.8* 5.5* 6.0*  ALBUMIN 3.3* 2.8* 2.6* 2.8*    Recent Labs Lab 03/27/16 0830 03/28/16 0557 04/01/16 1901  LIPASE 374* 778* 258*   Urine analysis:    Component Value Date/Time   COLORURINE YELLOW 04/01/2016 [place] 04/01/2016 1830   LABSPEC 1.020 04/01/2016 1830   PHURINE 5.5 04/01/2016 1830   GLUCOSEU >1000 (A) 04/01/2016 1830    HGBUR MODERATE (A) 04/01/2016 1830   BILIRUBINUR NEGATIVE 04/01/2016 [place] NEGATIVE 04/01/2016 1830   PROTEINUR 30 (A) 04/01/2016 1830   NITRITE NEGATIVE 04/01/2016 1830   LEUKOCYTESUR NEGATIVE 04/01/2016 1830    Assessment/Plan Active Problems:   Crohn's disease ([place])   Leukopenia   Other acute pancreatitis   Exacerbation of Crohn's disease ([place])   1. Crohn's disease. Continue mercaptopurine at a lower dose for now, though with leukopenia and anemia, may have to go off of this.  Will consult heme-onc for further recommendation.  Will give her IV Folate at higher dose, as she is having megaloblastic anemia. Start IV Fllagyl and cipro. Start patient on clear liquids. Consult GI. 2. Leukopenia and anemia:  Suspicous from Mercaptopurine.  Will give IV Folate and consult heme/onc.  Will decrease dose further. She couldn't tolerate Remicade previously for her IBD.  3. Acute Pancreatitis. Start on IV fluids.  4. Chronic anemia. Order two units of PRBC. Recheck Hgb in the am. 5. HTN. Stable. Start on antihypertensives. 6. HLD. Start on statins. 7. DM type 2. Mildly elevated, 193. Start on SSI. 8. GERD. Start on Protonix.  DVT prophylaxis: Heparin Code Status: Full Family Communication: No family at bedside. Disposition Plan: Discharge once improved. Consults called: GI and hemotologist Admission status: Inpatient   Orvan Falconer, MD FACP [facility] If 7PM-7AM, please contact night-coverage [email or web address] Password [email or web address]  04/01/2016, 8:55 PM   By signing my name below, I, Clerance Lav, attest that this documentation has been prepared under the direction and in the presence of Orvan Falconer, MD. Electronically signed: Clerance Lav, Scribe. 04/01/16. 9:17PM

## 2016-04-01 NOTE — ED Notes (Signed)
Patient ambulatory to restroom with steady gait.

## 2016-04-02 DIAGNOSIS — D72819 Decreased white blood cell count, unspecified: Secondary | ICD-10-CM

## 2016-04-02 DIAGNOSIS — K5090 Crohn's disease, unspecified, without complications: Secondary | ICD-10-CM

## 2016-04-02 DIAGNOSIS — K50019 Crohn's disease of small intestine with unspecified complications: Secondary | ICD-10-CM

## 2016-04-02 LAB — GLUCOSE, CAPILLARY
GLUCOSE-CAPILLARY: 145 mg/dL — AB (ref 65–99)
GLUCOSE-CAPILLARY: 165 mg/dL — AB (ref 65–99)
Glucose-Capillary: 130 mg/dL — ABNORMAL HIGH (ref 65–99)
Glucose-Capillary: 110 mg/dL — ABNORMAL HIGH (ref 65–99)
Glucose-Capillary: 133 mg/dL — ABNORMAL HIGH (ref 65–99)
Glucose-Capillary: 135 mg/dL — ABNORMAL HIGH (ref 65–99)

## 2016-04-02 LAB — CBC WITH DIFFERENTIAL/PLATELET
BASOS ABS: 0 10*3/uL (ref 0.0–0.1)
BASOS PCT: 0 %
Eosinophils Absolute: 0.1 10*3/uL (ref 0.0–0.7)
Eosinophils Relative: 5 %
HEMATOCRIT: 31.3 % — AB (ref 36.0–46.0)
HEMOGLOBIN: 10.5 g/dL — AB (ref 12.0–15.0)
Lymphocytes Relative: 42 %
Lymphs Abs: 0.5 10*3/uL — ABNORMAL LOW (ref 0.7–4.0)
MCH: 32 pg (ref 26.0–34.0)
MCHC: 33.5 g/dL (ref 30.0–36.0)
MCV: 95.4 fL (ref 78.0–100.0)
MONOS PCT: 8 %
Monocytes Absolute: 0.1 10*3/uL (ref 0.1–1.0)
NEUTROS ABS: 0.5 10*3/uL — AB (ref 1.7–7.7)
NEUTROS PCT: 45 %
Platelets: 159 10*3/uL (ref 150–400)
RBC: 3.28 MIL/uL — AB (ref 3.87–5.11)
RDW: 21.6 % — ABNORMAL HIGH (ref 11.5–15.5)
WBC: 1.1 10*3/uL — AB (ref 4.0–10.5)

## 2016-04-02 LAB — PREPARE RBC (CROSSMATCH)

## 2016-04-02 LAB — TSH: TSH: 0.188 u[IU]/mL — AB (ref 0.350–4.500)

## 2016-04-02 LAB — ABO/RH: ABO/RH(D): O POS

## 2016-04-02 MED ORDER — HYDROCODONE-ACETAMINOPHEN 5-325 MG PO TABS
1.0000 | ORAL_TABLET | ORAL | Status: DC | PRN
  Administered 2016-04-02 – 2016-04-05 (×8): 1 via ORAL
  Filled 2016-04-02 (×8): qty 1

## 2016-04-02 MED ORDER — MERCAPTOPURINE 50 MG PO TABS: 25.0000 mg | ORAL_TABLET | Freq: Every day | ORAL | Status: DC

## 2016-04-02 MED ORDER — POLYSACCHARIDE IRON COMPLEX 150 MG PO CAPS
150.0000 mg | ORAL_CAPSULE | Freq: Every day | ORAL | Status: DC
  Administered 2016-04-02: 150 mg via ORAL
  Filled 2016-04-02: qty 1

## 2016-04-02 NOTE — Progress Notes (Signed)
[facility] Progress Note  Patient: Tamara Todd VQM:[id number]   PCP: [name], NP DOB: Dec 11, 1940   DOA: 04/01/2016   DOS: 04/02/2016   Date of Service: the patient was seen and examined on 04/02/2016  Brief hospital course: Pt. with PMH of Crohn's disease, leukopenia, pancreatitis, gallstones; admitted on 04/01/2016, with complaint of diarrhea, was found to have Crohn's flareup. Currently further plan is continue treatment for Crohn's disease.  Assessment and Plan: 1. Crohn's disease exacerbation. Appreciate GI consultation. Patient was on 6-MP and its dose was reduced recently. Given patient's leukopenia patient will require a different regimen. Continue to follow recommendation from GI. Hematology has been referred.  2. Neutropenia. Currently C. difficile is negative. This is likely in the second setting of 6-MP toxicity. 6-MP discontinued. Hematologic consulted. Continue close monitoring. Expect counts to resolve on its own.  3. History of pancreatitis. Lipase is getting better. Continue close monitoring. GI transitioning the patient to full liquid diet. Patient denies any active pain.  4. Hypertension. Blood pressure stable, continue Norvasc and metoprolol Continue to monitor.  Pain management: When necessary Tylenol, when necessary Norco Activity: Will consult physical therapy Bowel regimen: last BM 04/02/2016 Diet: Full liquid diet DVT Prophylaxis: Mechanical compression  Advance goals of care discussion: Full code  Family Communication: no family was present at bedside, at the time of interview.  Disposition:  Discharge to home. Expected discharge date: 04/04/2016, stabilization of diarrhea  Consultants: Gastroenterology, hematology Procedures: None  Antibiotics: Anti-infectives    Start     Dose/Rate Route Frequency Ordered Stop   04/01/16 2300  ciprofloxacin (CIPRO) IVPB 400 mg     400 mg 200 mL/hr over 60 Minutes Intravenous Every 12 hours  04/01/16 2257     04/01/16 2300  metroNIDAZOLE (FLAGYL) IVPB 500 mg     500 mg 100 mL/hr over 60 Minutes Intravenous Every 8 hours 04/01/16 2257 04/11/16 2259        Subjective: Continues to have diarrhea, no abdominal pain. No nausea Nutrition: Tolerating full liquids.  Objective: Physical Exam: Vitals:   04/02/16 0559 04/02/16 0629 04/02/16 0945 04/02/16 1312  BP: 138/70 133/66 (!) 151/70 117/73  Pulse: 80 78 73 74  Resp: 17 18 18 18   Temp: 98.2 F (36.8 C) 98.4 F (36.9 C) 98 F (36.7 C) 98.4 F (36.9 C)  TempSrc: Oral Oral Oral Oral  SpO2: 100% 100% 100% 100%  Weight:      Height:        Intake/Output Summary (Last 24 hours) at 04/02/16 1802 Last data filed at 04/02/16 1200  Gross per 24 hour  Intake             2390 ml  Output             1200 ml  Net             1190 ml   Filed Weights   04/01/16 1749 04/01/16 2301  Weight: 49 kg (108 lb) 48.4 kg (106 lb 11.2 oz)    General: Alert, Awake and Oriented to Time, Place and Person. Appear in mild distress, affect appropriate Eyes: PERRL, Conjunctiva normal ENT: Oral Mucosa clear moist. Neck: no JVD, no Abnormal Mass Or lumps Cardiovascular: S1 and S2 Present, aortic systolic Murmur, Respiratory: Bilateral Air entry equal and Decreased, no use of accessory muscle, Clear to Auscultation, no Crackles, no wheezes Abdomen: Bowel Sound present, Soft and no tenderness Skin: no redness, no Rash, no induration Extremities: no Pedal edema, no calf  tenderness Neurologic: Grossly no focal neuro deficit. Bilaterally Equal motor strength  Data Reviewed: CBC:  Recent Labs Lab 03/27/16 0830 03/28/16 0557 04/01/16 1901 04/02/16 1046  WBC 1.4* 1.5* 1.1* 1.1*  NEUTROABS 1.0*  --  0.4* 0.5*  HGB 9.2* 8.9* 7.7* 10.5*  HCT 29.4* 27.2* 22.9* 31.3*  MCV 108.5* 103.8* 103.2* 95.4  PLT 160 115* 145* 034   Basic Metabolic Panel:  Recent Labs Lab 03/27/16 0830 03/28/16 0557 04/01/16 1901  NA 134* 133* 138  K 5.0 4.2  3.8  CL 90* 91* 107  CO2 24 32 25  GLUCOSE 167* 126* 193*  BUN 40* 32* 11  CREATININE 1.71* 0.72 0.43*  CALCIUM 9.2 9.1 8.6*    Liver Function Tests:  Recent Labs Lab 03/27/16 0830 03/28/16 0557 03/29/16 0546 04/01/16 1901  AST 155* 83* 90* 72*  ALT 81* 60* 61* 87*  ALKPHOS 53 41 45 70  BILITOT 1.7* 1.7* 1.8* 0.5  PROT 7.0 5.8* 5.5* 6.0*  ALBUMIN 3.3* 2.8* 2.6* 2.8*    Recent Labs Lab 03/27/16 0830 03/28/16 0557 04/01/16 1901  LIPASE 374* 778* 258*   No results for input(s): AMMONIA in the last 168 hours. Coagulation Profile: No results for input(s): INR, PROTIME in the last 168 hours. Cardiac Enzymes: No results for input(s): CKTOTAL, CKMB, CKMBINDEX, TROPONINI in the last 168 hours. BNP (last 3 results) No results for input(s): PROBNP in the last 8760 hours.  CBG:  Recent Labs Lab 04/02/16 0223 04/02/16 0610 04/02/16 1043 04/02/16 1459 04/02/16 1640  GLUCAP 130* 110* 145* 165* 133*    Studies: No results found.   Scheduled Meds: . amLODipine  10 mg Oral Daily  . ciprofloxacin  400 mg Intravenous Q12H  . cyanocobalamin  1,000 mcg Intramuscular Q14 Days  . folic acid (FOLVITE) IVPB  5 mg Intravenous Daily  . heparin  5,000 Units Subcutaneous Q8H  . insulin aspart  0-15 Units Subcutaneous Q4H  . iron polysaccharides  150 mg Oral Daily  . metoprolol succinate  25 mg Oral Daily  . metronidazole  500 mg Intravenous Q8H   Continuous Infusions: . dextrose 5 % and 0.9 % NaCl with KCl 20 mEq/L 125 mL/hr at 04/01/16 2319   PRN Meds: acetaminophen, HYDROcodone-acetaminophen  Time spent: 30 minutes  Author: Berle Mull, MD [facility] Pager: [phone] 04/02/2016 6:02 PM  If 7PM-7AM, please contact night-coverage at [email or web address], password [email or web address]

## 2016-04-02 NOTE — Consult Note (Addendum)
Referring Provider: No ref. provider found Primary Care Physician:  [name], NP Primary Gastroenterologist:  Barney Drain  Reason for Consultation:  DIARRHEA   Impression: Admitted with weakness and diarrhea. PMHx: SIBO, CROHNS DISEASE. NOTED TO HAVE PANCYTOPENIA ON MERCAPTOPURINE. WATERY STOOLS FOR 2 WEEKS-C DIFF PCR NEGATIVE. DIFFERENTIAL DIAGNOSIS INCLUDES: INFECTIOUS COLITIS(VIRUS V. BACTERIA), FLARE OF SIBO, LESS LIKELY UTI, THYROID DISTURBANCE, GIARDIASIS, CMV COLITIS, OR IBD FLARE. AWAITING SURGERY CONSULT FOR POSSIBLE CHRONIC CHOLESYTITIS  Plan: 1. FULL LIQUID DIET 2. STOP MERCAPTOPURINE. NEED MERCAPTOPURINE METABOLITE(THIOPMETAB) PANEL TO ASSESS 6-MMP LEVEL. PT AWARE RESULTS WILL RETURN IN 7 DAYS. HAND WRITTEN ORDER ENT TO LAB 3. OBTAIN HEP B s Ag, HEP B sAb, TOTAL HEP A, HEP C Ab, AND TB ASSAY. 4. SUPPORTIVE CARE. 5. NEEDS ALTERNATIVE TO MERCAPTOPURINE MORE THAN LIKELY HUMIRA, OR ENTYVIO. 6. AGREE WITH HEMATOLOGY REFERRAL. NEUTROPENIC PRECAUTIONS(ANC 495) 7. NORCO PRN. 8. CONTINUE CIPRO AND FLAGYL TO TREAT SMALL INTESTINE BACTERIAL OVERGROWTH(SIBO).   GREATER THAN 50% WAS SPENT IN COUNSELING & COORDINATION OF CARE WITH THE PATIENT: DISCUSSED DIFFERENTIAL DIAGNOSIS, PROCEDURE, BENEFITS, RISKS, AND MANAGEMENT OF CROHN'S DISEASE. TOTAL ENCOUNTER TIME: 5 MINS.     HPI:  CHANGE ON BOWEL HABITS 2 WEEKS AGO. Was here the first time due feeling sick and pain and vomiting and diarrhea. CAME ON SUN AND STAYED UNTIL TUES. THOUGH IT WAS HER GALLBLADDER. GOT SICK BEFORE SAW THE SURGEON. HURTING AROUND [place] AND DOWN THE SIDES. A LITTLE BETTER TODAY. WHEN CAM IN 2ND TIME: PAIN NOT WORSE JUST FELT BAD. NO ENERGY AND COULDN'T MOVE. FOUND BLOOD COUNT WAS LOW AND SHE GOT BLOOD. ATE LUNCH TODAY AND HAS HAD DIARRHEA Q10-15 MINS. MAY HAVE SEEN BLOOD IN HER URINE. WATERY STOOLS AFTER SHE EATS AND TODAY IS THE WORST. AT E THIS AM NOT BOTHERED. PAIN FEELS DULL ACHE IN UPPER ABDOMEN. BEEN CONSTANT  FOR 2 WEEKS: BETTER. HAS THROAT PAIN. NEVER HAD PROBLEM WITH LOW WBC. WAS ON SAME DOSE MERCAPTOPURINE DAILY FOR PAST 16 YEARS. NO NEW MEDS OR OTC PRODUCTS. NO TRAVEL OR ABX OTHER THAN WHEN SHE WAS IN HOSPITAL. BEEN ON CREON AT HOME-LOOSE STOOL NO BETTER SEEMED [place]. NORMAL FORMED STOOL TWO WEEKS AGO. ON DICLOFENAC FOR A WHILE.  PT DENIES FEVER, CHILLS, HEMATOCHEZIA, HEMATEMESIS, nausea, vomiting, melena, CHEST PAIN, SHORTNESS OF BREATH, CHANGE IN BOWEL IN HABITS, constipation, problems swallowing, problems with sedation, OR heartburn or indigestion.  Past Medical History:  Diagnosis Date  . Crohn's disease ([place])    used to be followed by Dr. Algis Greenhouse. diagnosis in her 27s. multiple surgeries. remicade allergy.  . Diabetes ([place])   . GERD (gastroesophageal reflux disease)   . HTN (hypertension)   . Hyperlipidemia   . Hypertension   . Osteoporosis       . Pancreatitis    09/2015    Past Surgical History:  Procedure Laterality Date  . BACTERIAL OVERGROWTH TEST N/A 02/24/2016   POSITIVE for small bowel bacterial overgrowth   . BIOPSY  02/10/2016   Procedure: BIOPSY;  Surgeon: Danie Binder, MD;  [facility];  Service: Endoscopy;;  Random colon biopsies Gastric biopsies for h pylori gastric polyp biopsies  . BOWEL RESECTION     TWICE for stricture, including right colectomy with terminal ileal resection  . COLONOSCOPY    . COLONOSCOPY N/A 02/10/2016   Dr. Oneida Alar: normal neo terminal ileum, diverticula in sigmoid colon, non-bleeding internal hemorrhoids, adenomatous rectal polyp.   . ESOPHAGOGASTRODUODENOSCOPY N/A 02/10/2016   Dr. Oneida Alar:  chronic gastritis, small hiatal hernia, few benign-appearing gastric polyps, no source for weight loss.   Marland Kitchen PARTIAL HYSTERECTOMY    . POLYPECTOMY  02/10/2016   Procedure: POLYPECTOMY;  Surgeon: Danie Binder, MD;  Location: [facility];  Service: Endoscopy;;  Rectal polyps x 2 removed 1 by hot snare and number 2 via cold  forceps  . 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     Prior to Admission medications   Medication Sig Start Date End Date Taking? Authorizing Provider  acetaminophen (TYLENOL) 650 MG CR tablet Take 1 tablet (650 mg total) by mouth every 8 (eight) hours as needed for pain. 03/30/16  Yes Lavina Hamman, MD  amLODipine (NORVASC) 10 MG tablet Take 10 mg by mouth daily. 11/17/15  Yes Historical Provider, MD  calcium carbonate (OS-CAL - DOSED IN MG OF ELEMENTAL CALCIUM) 1250 (500 Ca) MG tablet Take 1 tablet by mouth 2 (two) times daily with a meal.   Yes Historical Provider, MD  cyanocobalamin (,VITAMIN B-12,) 1000 MCG/ML injection Inject 1 mL into the muscle every 14 (fourteen) days. 01/08/16  Yes Historical Provider, MD  diclofenac (VOLTAREN) 75 MG EC tablet Take 75 mg by mouth daily.   Yes Historical Provider, MD  FeFum-FePo-FA-B Cmp-C-Zn-Mn-Cu (SE-TAN PLUS) 162-115.2-1 MG CAPS Take 1 capsule by mouth daily.   Yes Historical Provider, MD  HYDROcodone-acetaminophen (NORCO) 5-325 MG tablet Take 1 tablet by mouth every 6 (six) hours as needed for moderate pain. 03/30/16  Yes Lavina Hamman, MD  lipase/protease/amylase (CREON) 36000 UNITS CPEP capsule Take 72,000 Units by mouth 4 (four) times daily. With meals and snack.   Yes Historical Provider, MD  mercaptopurine (PURINETHOL) 50 MG tablet Give on an empty stomach 1 hour before or 2 hours after meals.  02/10/16  Yes Danie Binder, MD  metFORMIN (GLUCOPHAGE) 500 MG tablet Take 1 tablet (500 mg total) by mouth 2 (two) times daily. 03/28/16  Yes Wallis Bamberg, MD  metoprolol succinate (TOPROL-XL) 25 MG 24 hr tablet Take 25 mg by mouth daily. 10/09/15  Yes Historical Provider, MD  omeprazole (PRILOSEC) 20 MG capsule Take 20 mg by mouth daily. 10/25/15  Yes Historical Provider, MD  phenol (CHLORASEPTIC) 1.4 % LIQD Use as directed 1 spray in the mouth or throat as needed for throat irritation / pain.  03/30/16  Yes Lavina Hamman, MD  Vitamin D, Ergocalciferol, (DRISDOL) 50000 units CAPS capsule Take 50,000 Units by mouth once a week. Takes on Mondays. 09/24/15  Yes Historical Provider, MD    Current Facility-Administered Medications  Medication Dose Route Frequency Provider Last Rate Last Dose  . acetaminophen (TYLENOL) tablet 650 mg  650 mg Oral Q8H PRN Orvan Falconer, MD      . amLODipine (NORVASC) tablet 10 mg  10 mg Oral Daily Orvan Falconer, MD      . ciprofloxacin (CIPRO) IVPB 400 mg  400 mg Intravenous Q12H Orvan Falconer, MD   400 mg at 04/01/16 2318  . cyanocobalamin ((VITAMIN B-12)) injection 1,000 mcg  1,000 mcg Intramuscular Q14 Days Orvan Falconer, MD   1,000 mcg at 04/02/16 0038  . dextrose 5 % and 0.9 % NaCl with KCl 20 mEq/L infusion   Intravenous Continuous Orvan Falconer, MD 125 mL/hr at 04/01/16 2319    . folic acid 5 mg in sodium chloride 0.9 % 50 mL IVPB  5 mg Intravenous Daily Orvan Falconer, MD      .  heparin injection 5,000 Units  5,000 Units Subcutaneous Q8H Orvan Falconer, MD   5,000 Units at 04/02/16 [phone]  . insulin aspart (novoLOG) injection 0-15 Units  0-15 Units Subcutaneous Q4H Orvan Falconer, MD   2 Units at 04/02/16 0340  . iron polysaccharides (NIFEREX) capsule 150 mg  150 mg Oral Daily Lavina Hamman, MD      . metoprolol succinate (TOPROL-XL) 24 hr tablet 25 mg  25 mg Oral Daily Orvan Falconer, MD      . metroNIDAZOLE (FLAGYL) IVPB 500 mg  500 mg Intravenous Q8H Orvan Falconer, MD   500 mg at 04/01/16 2318    Allergies as of 04/01/2016 - Review Complete 04/01/2016  Allergen Reaction Noted  . Lisinopril  12/08/2015  . Remicade [infliximab] Other (See Comments) 12/08/2015  . Codeine Rash 12/08/2015    Family History  Problem Relation Age of Onset  . Colon cancer Father     59s  . Inflammatory bowel disease Sister     ???     Social History   Social History  . Marital status: Married    Spouse name: N/A  . Number of children: 1  . Years of education: N/A   Occupational History  . retired     Social History Main Topics  . Smoking status: Never Smoker  . Smokeless tobacco: Never Used  . Alcohol use No  . Drug use: No  . Sexual activity: Not on file   Other Topics Concern  . Not on file   Social History Narrative  . No narrative on file    Review of Systems: PER HPI OTHERWISE ALL SYSTEMS ARE NEGATIVE.   Vitals: Blood pressure 133/66, pulse 78, temperature 98.4 F (36.9 C), temperature source Oral, resp. rate 18, height 4' 11"  (1.499 m), weight 106 lb 11.2 oz (48.4 kg), SpO2 100 %.  Physical Exam: General:   Alert,  Well-developed, well-nourished, pleasant and cooperative in NAD Head:  Normocephalic and atraumatic. Eyes:  Sclera clear, no icterus.   Conjunctiva pink. Mouth:  No lesions, dentition normal. Neck:  Supple; no masses. Lungs:  Clear throughout to auscultation.   No wheezes. No acute distress. Heart:  Regular rate and rhythm; no murmurs, clicks, rubs,  or gallops. Abdomen:  Soft, nontender and nondistended. No masses, hepatosplenomegaly or hernias noted. Normal bowel sounds, without guarding, and without rebound.   Msk:  Symmetrical without gross deformities. Normal posture. Extremities:  Without edema. Neurologic:  Alert and  oriented x4;  grossly normal neurologically. Cervical Nodes:  No significant cervical adenopathy. Psych:  Alert and cooperative. Normal mood and affect.   Lab Results: C DIFF PCR NEGATIVE, TSH 0.188  Recent Labs  04/01/16 1901  WBC 1.1*  HGB 7.7*  HCT 22.9*  PLT 145*   BMET  Recent Labs  04/01/16 1901  NA 138  K 3.8  CL 107  CO2 25  GLUCOSE 193*  BUN 11  CREATININE 0.43*  CALCIUM 8.6*   LFT  Recent Labs  04/01/16 1901  PROT 6.0*  ALBUMIN 2.8*  AST 72*  ALT 87*  ALKPHOS 70  BILITOT 0.5     Studies/Results:   LOS: 1 day   Birgitta Uhlir  04/02/2016, 9:18 AM

## 2016-04-02 NOTE — Progress Notes (Signed)
Upon arrival to floor I was given report that Mrs. Mineo was on her second unit of blood and all I had to do was get end of infusion vitals and get a post op lab. I put in the vital signs and called lab. No documentation had been done on stopping the first unit of blood and the second unit was not scanned. I did not do anything with documentation on this since I had no idea of what had been done.

## 2016-04-03 DIAGNOSIS — K858 Other acute pancreatitis without necrosis or infection: Secondary | ICD-10-CM

## 2016-04-03 LAB — HEPATITIS B SURFACE ANTIGEN: HEP B S AG: NEGATIVE

## 2016-04-03 LAB — COMPREHENSIVE METABOLIC PANEL
ALBUMIN: 2.7 g/dL — AB (ref 3.5–5.0)
ALK PHOS: 59 U/L (ref 38–126)
ALT: 65 U/L — AB (ref 14–54)
ANION GAP: 6 (ref 5–15)
AST: 44 U/L — AB (ref 15–41)
BILIRUBIN TOTAL: 1 mg/dL (ref 0.3–1.2)
BUN: 5 mg/dL — ABNORMAL LOW (ref 6–20)
CHLORIDE: 107 mmol/L (ref 101–111)
CO2: 22 mmol/L (ref 22–32)
Calcium: 8.2 mg/dL — ABNORMAL LOW (ref 8.9–10.3)
Creatinine, Ser: 0.34 mg/dL — ABNORMAL LOW (ref 0.44–1.00)
GFR calc Af Amer: >60 mL/min (ref >60)
GFR calc non Af Amer: >60 mL/min (ref >60)
GLUCOSE: 113 mg/dL — AB (ref 65–99)
POTASSIUM: 3.3 mmol/L — AB (ref 3.5–5.1)
Sodium: 135 mmol/L (ref 135–145)
TOTAL PROTEIN: 5.8 g/dL — AB (ref 6.5–8.1)

## 2016-04-03 LAB — TYPE AND SCREEN
ABO/RH(D): O POS
Antibody Screen: NEGATIVE
UNIT DIVISION: 0
UNIT DIVISION: 0

## 2016-04-03 LAB — CBC WITH DIFFERENTIAL/PLATELET
BASOS ABS: 0 10*3/uL (ref 0.0–0.1)
BASOS PCT: 0 %
EOS ABS: 0.1 10*3/uL (ref 0.0–0.7)
EOS PCT: 5 %
HCT: 30.2 % — ABNORMAL LOW (ref 36.0–46.0)
Hemoglobin: 10.2 g/dL — ABNORMAL LOW (ref 12.0–15.0)
Lymphocytes Relative: 48 %
Lymphs Abs: 0.6 10*3/uL — ABNORMAL LOW (ref 0.7–4.0)
MCH: 32 pg (ref 26.0–34.0)
MCHC: 33.8 g/dL (ref 30.0–36.0)
MCV: 94.7 fL (ref 78.0–100.0)
MONO ABS: 0.1 10*3/uL (ref 0.1–1.0)
Monocytes Relative: 11 %
Neutro Abs: 0.4 10*3/uL — ABNORMAL LOW (ref 1.7–7.7)
Neutrophils Relative %: 35 %
PLATELETS: 173 10*3/uL (ref 150–400)
RBC: 3.19 MIL/uL — ABNORMAL LOW (ref 3.87–5.11)
RDW: 21.3 % — AB (ref 11.5–15.5)
WBC: 1.1 10*3/uL — CL (ref 4.0–10.5)

## 2016-04-03 LAB — GLUCOSE, CAPILLARY
GLUCOSE-CAPILLARY: 109 mg/dL — AB (ref 65–99)
GLUCOSE-CAPILLARY: 182 mg/dL — AB (ref 65–99)
Glucose-Capillary: 109 mg/dL — ABNORMAL HIGH (ref 65–99)
Glucose-Capillary: 190 mg/dL — ABNORMAL HIGH (ref 65–99)
Glucose-Capillary: 114 mg/dL — ABNORMAL HIGH (ref 65–99)
Glucose-Capillary: 161 mg/dL — ABNORMAL HIGH (ref 65–99)

## 2016-04-03 LAB — HEPATITIS A ANTIBODY, TOTAL: HEP A TOTAL AB: POSITIVE — AB

## 2016-04-03 LAB — MAGNESIUM: MAGNESIUM: 1 mg/dL — AB (ref 1.7–2.4)

## 2016-04-03 LAB — HEPATITIS B SURFACE ANTIBODY,QUALITATIVE: Hep B S Ab: REACTIVE

## 2016-04-03 LAB — HEPATITIS C ANTIBODY

## 2016-04-03 LAB — PROTIME-INR
INR: 0.95
Prothrombin Time: 12.7 seconds (ref 11.4–15.2)

## 2016-04-03 LAB — HEMOGLOBIN A1C
Hgb A1c MFr Bld: 6.5 % — ABNORMAL HIGH (ref 4.8–5.6)
Mean Plasma Glucose: 140 mg/dL

## 2016-04-03 MED ORDER — MAGNESIUM SULFATE 2 GM/50ML IV SOLN
2.0000 g | Freq: Once | INTRAVENOUS | Status: AC
  Administered 2016-04-03: 2 g via INTRAVENOUS
  Filled 2016-04-03: qty 50

## 2016-04-03 NOTE — Progress Notes (Signed)
Patient ID: Tamara Todd, female   DOB: [birth year], 75 y.o.   MRN: [id number]   Assessment/Plan: ADMITTED WITH DIARRHEA, WEAKNESS, AND PANCYTOPENIA ON MERCAPTOPURINE FOR 16 YRS-NO CHANGE IN DOSE. PRIOR EPSIDOE OF PANCREATITIS IN [facility] 2017 AT OSH-ETIOLOGY UNCLEAR, GB IN SITU. CLINICALLY STABLE. THIOPURINE METABOLITES PENDING, SUSPECT LEUKOPENIA, LIPASE ELEVATION, AND LIVER ENZYME ELEVATION MOST LIKELY DUE TO MERCAPTOPURINE, LESS LIKELY CHRONIC CHOLECYSTITIS WITH CBD STONES/MICROLITHIASIS. DIARRHEA IMPROVED ON CIP/FLAGYL AND LIKELY DUE TO TREATING SMALL INTESTINE BACTERIAL OVERGROWTH.    PLAN: 1. AWAIT THIOPMETAB PANEL AND TB ASSAY. OBTAIN STOOL PANEL IF DIARRHEA RETURNS. 2. AWAIT HEMATOLOGY RECOMMENDATIONS 3. REDUCE MIVFs AND D/C ORAL IRON UNTIL PT DISCHARGED. 4. CIPRO/FLAGYL FOR 5-7 DAYS. 5. EXPLAINED TO PT HER SYMPTOMS MAY BE DUE TO MERCAPTOPURINE.   Subjective: Since I last evaluated the patient SHE HAS NOT HAD A BM. USED 1 [place] AND ONE YESTERDAY. NAUSEA WHEN SHE FIRST STARTED EATING. NO VOMITING. MILD ABDOMINAL PAIN.   Objective: Vital signs in last 24 hours: Vitals:   04/02/16 2147 04/03/16 0541  BP: 137/66 (!) 137/55  Pulse: 68 66  Resp: 18 18  Temp: 98.6 F (37 C) 98.6 F (37 C)   General appearance: alert, cooperative and no distress Resp: clear to auscultation bilaterally Cardio: regular rate and rhythm, MURMUR PRESENT GI: soft, MILDLY tender x 4; bowel sounds normal;   Lab Results:   JUN 2017  SEP 10 SEP 11 SEP 15 SEP 16 SEP 17   LIPASE    374  774  258 AST  22   155  83  72  44 ALT  17   81  60  87  65 WBC  3.8   1.4  1.5  1.1  1.1  1.1  PLT CT 179   160  115  145  159  179 Hb  9.7   9.2  8.9  7.7    10.2   VIT B12 906 FOLATE 22.7  PENDING TB ASSAY, THIOPMETAB PANEL, SEROLOGIES: POS HEP A Ab, & HEP B sAb, NEGATIVE HCV Ab AND HEP Bs Ag.   Studies/Results: No results found.  Medications: I have reviewed the patient's current medications.   LOS: 5  days   Barney Drain 12/26/2013, 2:23 PM

## 2016-04-03 NOTE — Progress Notes (Signed)
[facility] Progress Note  Patient: Tamara Todd KGY:[id number]   PCP: [name], NP DOB: [birth year]   DOA: 04/01/2016   DOS: 04/03/2016   Date of Service: the patient was seen and examined on 04/03/2016  Brief hospital course: Pt. with PMH of Crohn's disease, leukopenia, pancreatitis, gallstones; admitted on 04/01/2016, with complaint of diarrhea, was found to have Crohn's flareup. Currently further plan is continue treatment for Crohn's disease.  Assessment and Plan: 1. Crohn's disease exacerbation. Appreciate GI consultation. Patient was on 6-MP and its dose was reduced recently. Given patient's leukopenia patient will require a different regimen. Continue to follow recommendation from GI. Hematology has been referred. Currently C. difficile is negative.  2. Neutropenia. This is likely in the second setting of 6-MP toxicity. 6-MP discontinued. Hematologic consulted. Continue close monitoring. Expect counts to resolve on its own.  3. History of pancreatitis. Lipase is getting better. Continue close monitoring. GI transitioning the patient to full liquid diet. Patient denies any active pain.  4. Hypertension. Blood pressure stable, continue Norvasc and metoprolol Continue to monitor.  Pain management: When necessary Tylenol, when necessary Norco Activity: Will consult physical therapy Bowel regimen: last BM 04/03/2016 Diet: Full liquid diet DVT Prophylaxis: Mechanical compression  Advance goals of care discussion: Full code  Family Communication: no family was present at bedside, at the time of interview.  Disposition:  Discharge to home. Expected discharge date: 04/04/2016, stabilization of diarrhea  Consultants: Gastroenterology, hematology Procedures: None  Antibiotics: Anti-infectives    Start     Dose/Rate Route Frequency Ordered Stop   04/01/16 2300  ciprofloxacin (CIPRO) IVPB 400 mg     400 mg 200 mL/hr over 60 Minutes Intravenous Every 12 hours  04/01/16 2257     04/01/16 2300  metroNIDAZOLE (FLAGYL) IVPB 500 mg     500 mg 100 mL/hr over 60 Minutes Intravenous Every 8 hours 04/01/16 2257 04/11/16 2259     Subjective: Continues to have diarrhea, no abdominal pain. No nausea Nutrition: Tolerating full liquids.  Objective: Physical Exam: Vitals:   04/02/16 1312 04/02/16 2147 04/03/16 0541 04/03/16 1300  BP: 117/73 137/66 (!) 137/55 130/63  Pulse: 74 68 66 74  Resp: 18 18 18 18   Temp: 98.4 F (36.9 C) 98.6 F (37 C) 98.6 F (37 C) 97.8 F (36.6 C)  TempSrc: Oral Oral Oral Oral  SpO2: 100% 100% 100% 100%  Weight:      Height:        Intake/Output Summary (Last 24 hours) at 04/03/16 1712 Last data filed at 04/03/16 1438  Gross per 24 hour  Intake          3090.42 ml  Output                0 ml  Net          3090.42 ml   Filed Weights   04/01/16 1749 04/01/16 2301  Weight: 49 kg (108 lb) 48.4 kg (106 lb 11.2 oz)    General: Alert, Awake and Oriented to Time, Place and Person. Appear in mild distress, affect appropriate Eyes: PERRL, Conjunctiva normal ENT: Oral Mucosa clear moist. Neck: no JVD, no Abnormal Mass Or lumps Cardiovascular: S1 and S2 Present, aortic systolic Murmur, Respiratory: Bilateral Air entry equal and Decreased, no use of accessory muscle, Clear to Auscultation, no Crackles, no wheezes Abdomen: Bowel Sound present, Soft and no tenderness Skin: no redness, no Rash, no induration Extremities: no Pedal edema, no calf tenderness Neurologic: Grossly no focal neuro  deficit. Bilaterally Equal motor strength  Data Reviewed: CBC:  Recent Labs Lab 03/28/16 0557 04/01/16 1901 04/02/16 1046 04/03/16 0706  WBC 1.5* 1.1* 1.1* 1.1*  NEUTROABS  --  0.4* 0.5* 0.4*  HGB 8.9* 7.7* 10.5* 10.2*  HCT 27.2* 22.9* 31.3* 30.2*  MCV 103.8* 103.2* 95.4 94.7  PLT 115* 145* 159 536   Basic Metabolic Panel:  Recent Labs Lab 03/28/16 0557 04/01/16 1901 04/03/16 0706  NA 133* 138 135  K 4.2 3.8 3.3*  CL  91* 107 107  CO2 32 25 22  GLUCOSE 126* 193* 113*  BUN 32* 11 5*  CREATININE 0.72 0.43* 0.34*  CALCIUM 9.1 8.6* 8.2*  MG  --   --  1.0*    Liver Function Tests:  Recent Labs Lab 03/28/16 0557 03/29/16 0546 04/01/16 1901 04/03/16 0706  AST 83* 90* 72* 44*  ALT 60* 61* 87* 65*  ALKPHOS 41 45 70 59  BILITOT 1.7* 1.8* 0.5 1.0  PROT 5.8* 5.5* 6.0* 5.8*  ALBUMIN 2.8* 2.6* 2.8* 2.7*    Recent Labs Lab 03/28/16 0557 04/01/16 1901  LIPASE 778* 258*   No results for input(s): AMMONIA in the last 168 hours. Coagulation Profile:  Recent Labs Lab 04/03/16 0706  INR 0.95   Cardiac Enzymes: No results for input(s): CKTOTAL, CKMB, CKMBINDEX, TROPONINI in the last 168 hours. BNP (last 3 results) No results for input(s): PROBNP in the last 8760 hours.  CBG:  Recent Labs Lab 04/03/16 0003 04/03/16 0407 04/03/16 0713 04/03/16 1118 04/03/16 1610  GLUCAP 109* 182* 109* 190* 114*    Studies: No results found.   Scheduled Meds: . amLODipine  10 mg Oral Daily  . ciprofloxacin  400 mg Intravenous Q12H  . cyanocobalamin  1,000 mcg Intramuscular Q14 Days  . folic acid (FOLVITE) IVPB  5 mg Intravenous Daily  . heparin  5,000 Units Subcutaneous Q8H  . insulin aspart  0-15 Units Subcutaneous Q4H  . metoprolol succinate  25 mg Oral Daily  . metronidazole  500 mg Intravenous Q8H   Continuous Infusions: . dextrose 5 % and 0.9 % NaCl with KCl 20 mEq/L 50 mL/hr at 04/03/16 0923   PRN Meds: acetaminophen, HYDROcodone-acetaminophen  Time spent: 30 minutes  Author: Berle Mull, MD [facility] Pager: [phone] 04/03/2016 5:12 PM  If 7PM-7AM, please contact night-coverage at [email or web address], password [email or web address]

## 2016-04-03 NOTE — Progress Notes (Signed)
CRITICAL VALUE ALERT  Critical value received: WBC 1.1  Date of notification: 04/03/2016  Time of notification:  0920  Critical value read back: Yes  Nurse who received alert:  Donnella Sham, RN  MD notified (1st page): Marlowe Sax  Time of first page: 0930  MD notified (2nd page):  Time of second page:  Responding MD:  Marlowe Sax  Time MD responded:  [id number]

## 2016-04-04 ENCOUNTER — Encounter (HOSPITAL_COMMUNITY): Payer: Self-pay | Admitting: Gastroenterology

## 2016-04-04 DIAGNOSIS — D509 Iron deficiency anemia, unspecified: Secondary | ICD-10-CM

## 2016-04-04 DIAGNOSIS — K859 Acute pancreatitis without necrosis or infection, unspecified: Secondary | ICD-10-CM

## 2016-04-04 DIAGNOSIS — K5000 Crohn's disease of small intestine without complications: Secondary | ICD-10-CM

## 2016-04-04 DIAGNOSIS — R634 Abnormal weight loss: Secondary | ICD-10-CM

## 2016-04-04 DIAGNOSIS — T451X5A Adverse effect of antineoplastic and immunosuppressive drugs, initial encounter: Secondary | ICD-10-CM

## 2016-04-04 DIAGNOSIS — R740 Nonspecific elevation of levels of transaminase and lactic acid dehydrogenase [LDH]: Secondary | ICD-10-CM

## 2016-04-04 DIAGNOSIS — D649 Anemia, unspecified: Secondary | ICD-10-CM

## 2016-04-04 DIAGNOSIS — D702 Other drug-induced agranulocytosis: Secondary | ICD-10-CM

## 2016-04-04 DIAGNOSIS — R109 Unspecified abdominal pain: Secondary | ICD-10-CM

## 2016-04-04 DIAGNOSIS — D61818 Other pancytopenia: Secondary | ICD-10-CM

## 2016-04-04 LAB — GLUCOSE, CAPILLARY
GLUCOSE-CAPILLARY: 141 mg/dL — AB (ref 65–99)
GLUCOSE-CAPILLARY: 142 mg/dL — AB (ref 65–99)
GLUCOSE-CAPILLARY: 151 mg/dL — AB (ref 65–99)
Glucose-Capillary: 105 mg/dL — ABNORMAL HIGH (ref 65–99)
Glucose-Capillary: 107 mg/dL — ABNORMAL HIGH (ref 65–99)
Glucose-Capillary: 128 mg/dL — ABNORMAL HIGH (ref 65–99)
Glucose-Capillary: 134 mg/dL — ABNORMAL HIGH (ref 65–99)

## 2016-04-04 LAB — CBC WITH DIFFERENTIAL/PLATELET
BASOS PCT: 0 %
Basophils Absolute: 0 10*3/uL (ref 0.0–0.1)
EOS ABS: 0.1 10*3/uL (ref 0.0–0.7)
Eosinophils Relative: 5 %
HCT: 29.8 % — ABNORMAL LOW (ref 36.0–46.0)
Hemoglobin: 10.4 g/dL — ABNORMAL LOW (ref 12.0–15.0)
LYMPHS ABS: 0.6 10*3/uL — AB (ref 0.7–4.0)
Lymphocytes Relative: 50 %
MCH: 33.2 pg (ref 26.0–34.0)
MCHC: 34.9 g/dL (ref 30.0–36.0)
MCV: 95.2 fL (ref 78.0–100.0)
Monocytes Absolute: 0.2 10*3/uL (ref 0.1–1.0)
Monocytes Relative: 12 %
NEUTROS ABS: 0.4 10*3/uL — AB (ref 1.7–7.7)
Neutrophils Relative %: 33 %
PLATELETS: 186 10*3/uL (ref 150–400)
RBC: 3.13 MIL/uL — ABNORMAL LOW (ref 3.87–5.11)
RDW: 21.3 % — AB (ref 11.5–15.5)
WBC: 1.3 10*3/uL — CL (ref 4.0–10.5)

## 2016-04-04 MED ORDER — PANTOPRAZOLE SODIUM 40 MG PO TBEC
40.0000 mg | DELAYED_RELEASE_TABLET | Freq: Every day | ORAL | Status: DC
  Administered 2016-04-04: 40 mg via ORAL
  Filled 2016-04-04: qty 1

## 2016-04-04 NOTE — Progress Notes (Addendum)
Patient OOB to chair

## 2016-04-04 NOTE — Progress Notes (Signed)
[facility] Progress Note  Patient: Tamara Todd SVX:[id number]   PCP: [name], NP DOB: Aug 11, 1940   DOA: 04/01/2016   DOS: 04/04/2016   Date of Service: the patient was seen and examined on 04/04/2016  Brief hospital course: Pt. with PMH of Crohn's disease, leukopenia, pancreatitis, gallstones; admitted on 04/01/2016, with complaint of diarrhea, was found to have Crohn's flareup. He has seen to be improving after initiation of the antibiotic. Currently further plan is continue treatment for Crohn's disease.  Assessment and Plan: 1. Crohn's disease exacerbation. Small bowel bacterial overgrowth.  Appreciate GI consultation. Patient was on 6-MP and its dose was reduced recently. Given patient's leukopenia patient will require a different regimen. Continue to follow recommendation from GI. Hematology has been referred. Currently C. difficile is negative. Finishing the Cipro and Flagyl for diarrhea is actually getting better. We'll continue to closely monitor. GI considering the patient to be started on Humira if quantiferron TB gold is negative.  2. Neutropenia. Pancytopenia This is likely in the setting of 6-MP toxicity. 6-MP discontinued. Hematologic consulted. Discussed with Dr. Whitney Muse this morning. Appear to be improving on its own. Patient status post 1 PRBC transfusion. H&H relatively stable and no active bleeding reported.  3. History of pancreatitis. Lipase is getting better. Continue close monitoring. GI transitioning the patient to full liquid diet. Patient denies any active pain.  4. Hypertension. Blood pressure stable, continue Norvasc and metoprolol Continue to monitor.  Pain management: When necessary Tylenol, when necessary Norco Activity: Consulted physical therapy Bowel regimen: last BM 04/04/2016 Diet: Cardiac diet DVT Prophylaxis: Mechanical compression  Advance goals of care discussion: Full code  Family Communication: no family was  present at bedside, at the time of interview.  Disposition:  Discharge to home. Expected discharge date: 04/04/2016, stabilization of diarrhea  Consultants: Gastroenterology, hematology Procedures: None  Antibiotics: Anti-infectives    Start     Dose/Rate Route Frequency Ordered Stop   04/01/16 2300  ciprofloxacin (CIPRO) IVPB 400 mg     400 mg 200 mL/hr over 60 Minutes Intravenous Every 12 hours 04/01/16 2257     04/01/16 2300  metroNIDAZOLE (FLAGYL) IVPB 500 mg     500 mg 100 mL/hr over 60 Minutes Intravenous Every 8 hours 04/01/16 2257 04/11/16 2259     Subjective: Continues to have diarrhea, no abdominal pain. No nausea Nutrition: Tolerating full liquids.  Objective: Physical Exam: Vitals:   04/03/16 1822 04/03/16 2208 04/03/16 2209 04/04/16 0500  BP:  132/62 113/66 126/67  Pulse:  72 79 77  Resp:  18 18 18   Temp:  98.7 F (37.1 C) 98.9 F (37.2 C) 98.3 F (36.8 C)  TempSrc:  Oral Oral Oral  SpO2: 99% 100% 98% 97%  Weight:      Height:        Intake/Output Summary (Last 24 hours) at 04/04/16 1535 Last data filed at 04/03/16 1800  Gross per 24 hour  Intake              240 ml  Output                0 ml  Net              240 ml   Filed Weights   04/01/16 1749 04/01/16 2301  Weight: 49 kg (108 lb) 48.4 kg (106 lb 11.2 oz)    General: Alert, Awake and Oriented to Time, Place and Person. Appear in mild distress, affect appropriate ENT: Oral Mucosa clear moist.  Cardiovascular: S1 and S2 Present, aortic systolic Murmur, Respiratory: Bilateral Air entry equal and Decreased, no use of accessory muscle, Clear to Auscultation, no Crackles, no wheezes Abdomen: Bowel Sound present, Soft and no tenderness Skin: no redness, no Rash, no induration Extremities: no Pedal edema, no calf tenderness Neurologic: Grossly no focal neuro deficit. Bilaterally Equal motor strength  Data Reviewed: CBC:  Recent Labs Lab 04/01/16 1901 04/02/16 1046 04/03/16 0706  04/04/16 0700  WBC 1.1* 1.1* 1.1* 1.3*  NEUTROABS 0.4* 0.5* 0.4* 0.4*  HGB 7.7* 10.5* 10.2* 10.4*  HCT 22.9* 31.3* 30.2* 29.8*  MCV 103.2* 95.4 94.7 95.2  PLT 145* 159 173 628   Basic Metabolic Panel:  Recent Labs Lab 04/01/16 1901 04/03/16 0706  NA 138 135  K 3.8 3.3*  CL 107 107  CO2 25 22  GLUCOSE 193* 113*  BUN 11 5*  CREATININE 0.43* 0.34*  CALCIUM 8.6* 8.2*  MG  --  1.0*    Liver Function Tests:  Recent Labs Lab 03/29/16 0546 04/01/16 1901 04/03/16 0706  AST 90* 72* 44*  ALT 61* 87* 65*  ALKPHOS 45 70 59  BILITOT 1.8* 0.5 1.0  PROT 5.5* 6.0* 5.8*  ALBUMIN 2.6* 2.8* 2.7*    Recent Labs Lab 04/01/16 1901  LIPASE 258*   No results for input(s): AMMONIA in the last 168 hours. Coagulation Profile:  Recent Labs Lab 04/03/16 0706  INR 0.95   Cardiac Enzymes: No results for input(s): CKTOTAL, CKMB, CKMBINDEX, TROPONINI in the last 168 hours. BNP (last 3 results) No results for input(s): PROBNP in the last 8760 hours.  CBG:  Recent Labs Lab 04/03/16 2030 04/04/16 0015 04/04/16 0201 04/04/16 0736 04/04/16 1124  GLUCAP 161* 142* 134* 128* 151*    Studies: No results found.   Scheduled Meds: . amLODipine  10 mg Oral Daily  . ciprofloxacin  400 mg Intravenous Q12H  . cyanocobalamin  1,000 mcg Intramuscular Q14 Days  . folic acid (FOLVITE) IVPB  5 mg Intravenous Daily  . heparin  5,000 Units Subcutaneous Q8H  . insulin aspart  0-15 Units Subcutaneous Q4H  . metoprolol succinate  25 mg Oral Daily  . metronidazole  500 mg Intravenous Q8H  . pantoprazole  40 mg Oral QAC supper   Continuous Infusions: . dextrose 5 % and 0.9 % NaCl with KCl 20 mEq/L 50 mL/hr at 04/03/16 2256   PRN Meds: acetaminophen, HYDROcodone-acetaminophen  Time spent: 30 minutes  Author: Berle Mull, MD [facility] Pager: [phone] 04/04/2016 3:35 PM  If 7PM-7AM, please contact night-coverage at [email or web address], password [facility]

## 2016-04-04 NOTE — Care Management Important Message (Signed)
Important Message  Patient Details  Name: Tamara Todd MRN: [id number] Date of Birth: Aug 29, 1940   Medicare Important Message Given:  Yes    Asna Muldrow, Chauncey Reading, RN 04/04/2016, 1:48 PM

## 2016-04-04 NOTE — Consult Note (Signed)
Inpatient Hematology/Oncology Consultation   Name: Tamara Todd      MRN: [id number]    Location: L544/B201-00  Date: 04/04/2016 Time:4:43 PM   REFERRING PHYSICIAN:  Dr. Raynelle Chary  REASON FOR CONSULT:  Pancytopenia on 6-MP   DIAGNOSIS:  Crohns Pancytopenia 6-MP use  HISTORY OF PRESENT ILLNESS:   Tamara Todd is a 75 y.o. female with a known history of Crohn's. Review of her chart notes that her disease has been complicated involving what sounds like both her small bowel and colon. 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 She has had Crohn's for at least 30 years. Previously treated with prednisone. Developed SOB with second dose of Remicade in remote. Has been on mercaptopurine "for years." It appears that she was seen initially by local GI earlier this year for the first time to establish care. Patient believes that she has been on 6 MP for 16 years. She thinks that the highest dose she was ever on was 75 mg daily. She also notes that a recent dose reduction after an admission in [place] was for abnormal blood counts.   She presented to the ED at [facility] on 03/27/2016 with abdominal pain. She was noted to have abnormal LFT's and lipase. She was pancytopenic with a total WBC count of 1.4 and ANC of 1000, hemoglobin of 9.2 gm/dl and normal platelet count. Only prior labs available for comparison are from 12/29/2015 and show a similar anemia but normal WBC count and differential and platelet count.   Labs from 12/29/2015 did show and underlying iron deficiency with iron saturation at 16%, and ferritin at 29 ng/ml. She states she takes iron. She denies ever having IV iron replacement.   CT imaging was performed in the ED. She ultimately improved and was felt to have mild pancreatitis, was discharged with close follow-up on 03/30/2016. She represented to the ED on 04/01/2016 with abdominal pain and diarrhea.  Given low  counts, 6-MP was held. She was transfused 2 U PRBC, given IV folate and 6-MMP levels have been sent.  Today she notes that she feels a lot better. She is ready to go home. She denies fever, chills, nausea or vomiting. She is eating better. Abdominal pain has markedly subsided, diarrhea is much improved.   PAST MEDICAL HISTORY:   Past Medical History:  Diagnosis Date  . Crohn's disease ([place])    used to be followed by Dr. Algis Greenhouse. diagnosis in her 52s. multiple surgeries. remicade allergy.  . Diabetes ([place])   . GERD (gastroesophageal reflux disease)   . HTN (hypertension)   . Hyperlipidemia   . Hypertension   . Osteoporosis       . Pancreatitis    09/2015    ALLERGIES: Allergies  Allergen Reactions  . Lisinopril     Cough   . Remicade [Infliximab] Other (See Comments)    Couldn't breath  . Codeine Rash      MEDICATIONS: I have reviewed the patient's current medications.     PAST SURGICAL HISTORY Past Surgical History:  Procedure Laterality Date  . BACTERIAL OVERGROWTH TEST N/A 02/24/2016   POSITIVE for small bowel bacterial overgrowth   . BIOPSY  02/10/2016   Procedure: BIOPSY;  Surgeon: Danie Binder, MD;  Location: [facility];  Service: Endoscopy;;  Random colon biopsies Gastric biopsies for h pylori gastric polyp biopsies  . BOWEL RESECTION     TWICE for stricture, including right  colectomy with terminal ileal resection  . COLONOSCOPY    . COLONOSCOPY N/A 02/10/2016   Dr. Oneida Alar: normal neo terminal ileum, diverticula in sigmoid colon, non-bleeding internal hemorrhoids, adenomatous rectal polyp.   . ESOPHAGOGASTRODUODENOSCOPY N/A 02/10/2016   Dr. Oneida Alar: chronic gastritis, small hiatal hernia, few benign-appearing gastric polyps, no source for weight loss.   Marland Kitchen PARTIAL HYSTERECTOMY    . POLYPECTOMY  02/10/2016   Procedure: POLYPECTOMY;  Surgeon: Danie Binder, MD;  Location: [facility];  Service: Endoscopy;;  Rectal polyps x 2 removed 1 by hot snare and number 2  via cold forceps  . RECTOVAGINAL FISTULA CLOSURE  2004   with temporary colostomy and subsequent take down, all at [facility].     FAMILY HISTORY: Family History  Problem Relation Age of Onset  . Colon cancer Father     30s  . Inflammatory bowel disease Sister     ???    SOCIAL HISTORY:  reports that she has never smoked. She has never used smokeless tobacco. She reports that she does not drink alcohol or use drugs.  PERFORMANCE STATUS: The patient's performance status is 1 - Symptomatic but completely ambulatory  Review of Systems  Constitutional: Positive for malaise/fatigue. Negative for chills and fever.  HENT: Negative for hearing loss and sore throat.   Eyes: Negative for blurred vision, double vision, photophobia, pain, discharge and redness.  Respiratory: Negative for cough, shortness of breath and stridor.   Cardiovascular: Negative for chest pain, palpitations and orthopnea.  Gastrointestinal: Positive for abdominal pain and diarrhea. Negative for nausea and vomiting.       Abdominal pain improved, diarrhea improved  Genitourinary: Negative for dysuria and urgency.  Musculoskeletal: Positive for joint pain.  Neurological: Positive for weakness. Negative for dizziness, tingling, tremors, sensory change, speech change, focal weakness and headaches.  Psychiatric/Behavioral: Negative for depression, hallucinations, substance abuse and suicidal ideas. The patient is not nervous/anxious.     PHYSICAL EXAMINATION  ECOG PERFORMANCE STATUS: 1 - Symptomatic but completely ambulatory  Vitals:   04/03/16 2209 04/04/16 0500  BP: 113/66 126/67  Pulse: 79 77  Resp: 18 18  Temp: 98.9 F (37.2 C) 98.3 F (36.8 C)    Physical Exam  Constitutional: She is oriented to person, place, and time and well-developed, well-nourished, and in no distress.  HENT:  Head: Normocephalic and atraumatic.  Mouth/Throat: No oropharyngeal exudate.  Eyes: Conjunctivae are normal. Pupils are equal,  round, and reactive to light. Right eye exhibits no discharge. Left eye exhibits no discharge.  Neck: No tracheal deviation present. No thyromegaly present.  Cardiovascular: Normal rate, regular rhythm and normal heart sounds.   Pulmonary/Chest: Effort normal and breath sounds normal. No respiratory distress. She has no wheezes.  Abdominal: Soft. Bowel sounds are normal. She exhibits no distension. There is tenderness. There is no rebound.  Musculoskeletal: Normal range of motion. She exhibits no edema.  Lymphadenopathy:    She has no cervical adenopathy.  Neurological: She is alert and oriented to person, place, and time. No cranial nerve deficit. Gait normal.  Skin: Skin is warm and dry. No rash noted. No erythema.  Psychiatric: Mood, memory, affect and judgment normal.    LABORATORY DATA:  Results for orders placed or performed during the hospital encounter of 04/01/16 (from the past 48 hour(s))  Hepatitis A antibody, total     Status: Abnormal   Collection Time: 04/02/16  4:59 PM  Result Value Ref Range   Hep A Total Ab Positive (A) Negative  Comment: (NOTE) Performed At: [facility] [place], [place] [id number] Lindon Romp MD [id number]   Hepatitis B surface antibody     Status: None   Collection Time: 04/02/16  4:59 PM  Result Value Ref Range   Hep B S Ab Reactive     Comment: (NOTE)              Non Reactive: Inconsistent with immunity,                            less than 10 mIU/mL              Reactive:     Consistent with immunity,                            greater than 9.9 mIU/mL Performed At: [facility] [place], [place] [id number] Lindon Romp MD [id number]   Hepatitis B surface antigen     Status: None   Collection Time: 04/02/16  4:59 PM  Result Value Ref Range   Hepatitis B Surface Ag Negative Negative    Comment: (NOTE) Performed At: [facility] [place], [place]  [id number] Lindon Romp MD [id number]   Hepatitis C antibody     Status: None   Collection Time: 04/02/16  4:59 PM  Result Value Ref Range   HCV Ab <0.1 0.0 - 0.9 s/co ratio    Comment: (NOTE)                                  Negative:     < 0.8                             Indeterminate: 0.8 - 0.9                                  Positive:     > 0.9 The CDC recommends that a positive HCV antibody result be followed up with a HCV Nucleic Acid Amplification test (761950). Performed At: [facility] [place], [place] [id number] Lindon Romp MD YK:[id number]   Glucose, capillary     Status: Abnormal   Collection Time: 04/02/16  8:14 PM  Result Value Ref Range   Glucose-Capillary 135 (H) 65 - 99 mg/dL   Comment 1 Notify RN    Comment 2 Document in Chart   ABO/Rh     Status: None   Collection Time: 04/02/16 11:23 PM  Result Value Ref Range   ABO/RH(D) O POS   Glucose, capillary     Status: Abnormal   Collection Time: 04/03/16 12:03 AM  Result Value Ref Range   Glucose-Capillary 109 (H) 65 - 99 mg/dL   Comment 1 Notify RN    Comment 2 Document in Chart   Glucose, capillary     Status: Abnormal   Collection Time: 04/03/16  4:07 AM  Result Value Ref Range   Glucose-Capillary 182 (H) 65 - 99 mg/dL   Comment 1 Notify RN    Comment 2 Document in Chart   CBC WITH DIFFERENTIAL     Status: Abnormal   Collection Time: 04/03/16  7:06 AM  Result Value  Ref Range   WBC 1.1 (LL) 4.0 - 10.5 K/uL    Comment: CRITICAL RESULT CALLED TO, READ BACK BY AND VERIFIED WITH: GARZON. S AT 0915 ON 04/03/2016 BY WOODS.M    RBC 3.19 (L) 3.87 - 5.11 MIL/uL   Hemoglobin 10.2 (L) 12.0 - 15.0 g/dL   HCT 30.2 (L) 36.0 - 46.0 %   MCV 94.7 78.0 - 100.0 fL   MCH 32.0 26.0 - 34.0 pg   MCHC 33.8 30.0 - 36.0 g/dL   RDW 21.3 (H) 11.5 - 15.5 %   Platelets 173 150 - 400 K/uL   Neutrophils Relative % 35 %   Neutro Abs 0.4 (L) 1.7 - 7.7 K/uL   Lymphocytes Relative 48 %   Lymphs  Abs 0.6 (L) 0.7 - 4.0 K/uL   Monocytes Relative 11 %   Monocytes Absolute 0.1 0.1 - 1.0 K/uL   Eosinophils Relative 5 %   Eosinophils Absolute 0.1 0.0 - 0.7 K/uL   Basophils Relative 0 %   Basophils Absolute 0.0 0.0 - 0.1 K/uL  Comprehensive metabolic panel     Status: Abnormal   Collection Time: 04/03/16  7:06 AM  Result Value Ref Range   Sodium 135 135 - 145 mmol/L   Potassium 3.3 (L) 3.5 - 5.1 mmol/L   Chloride 107 101 - 111 mmol/L   CO2 22 22 - 32 mmol/L   Glucose, Bld 113 (H) 65 - 99 mg/dL   BUN 5 (L) 6 - 20 mg/dL   Creatinine, Ser 0.34 (L) 0.44 - 1.00 mg/dL   Calcium 8.2 (L) 8.9 - 10.3 mg/dL   Total Protein 5.8 (L) 6.5 - 8.1 g/dL   Albumin 2.7 (L) 3.5 - 5.0 g/dL   AST 44 (H) 15 - 41 U/L   ALT 65 (H) 14 - 54 U/L   Alkaline Phosphatase 59 38 - 126 U/L   Total Bilirubin 1.0 0.3 - 1.2 mg/dL   GFR calc non Af Amer >60 >60 mL/min   GFR calc Af Amer >60 >60 mL/min    Comment: (NOTE) The eGFR has been calculated using the CKD EPI equation. This calculation has not been validated in all clinical situations. eGFR's persistently <60 mL/min signify possible Chronic Kidney Disease.    Anion gap 6 5 - 15  Magnesium     Status: Abnormal   Collection Time: 04/03/16  7:06 AM  Result Value Ref Range   Magnesium 1.0 (L) 1.7 - 2.4 mg/dL  Protime-INR     Status: None   Collection Time: 04/03/16  7:06 AM  Result Value Ref Range   Prothrombin Time 12.7 11.4 - 15.2 seconds   INR 0.95   Glucose, capillary     Status: Abnormal   Collection Time: 04/03/16  7:13 AM  Result Value Ref Range   Glucose-Capillary 109 (H) 65 - 99 mg/dL   Comment 1 Notify RN    Comment 2 Document in Chart   Glucose, capillary     Status: Abnormal   Collection Time: 04/03/16 11:18 AM  Result Value Ref Range   Glucose-Capillary 190 (H) 65 - 99 mg/dL   Comment 1 Notify RN    Comment 2 Document in Chart   Glucose, capillary     Status: Abnormal   Collection Time: 04/03/16  4:10 PM  Result Value Ref Range    Glucose-Capillary 114 (H) 65 - 99 mg/dL   Comment 1 Notify RN    Comment 2 Document in Chart   Glucose, capillary  Status: Abnormal   Collection Time: 04/03/16  8:30 PM  Result Value Ref Range   Glucose-Capillary 161 (H) 65 - 99 mg/dL   Comment 1 Notify RN    Comment 2 Document in Chart   Glucose, capillary     Status: Abnormal   Collection Time: 04/04/16 12:15 AM  Result Value Ref Range   Glucose-Capillary 142 (H) 65 - 99 mg/dL   Comment 1 Notify RN    Comment 2 Document in Chart   Glucose, capillary     Status: Abnormal   Collection Time: 04/04/16  2:01 AM  Result Value Ref Range   Glucose-Capillary 134 (H) 65 - 99 mg/dL   Comment 1 Notify RN    Comment 2 Document in Chart   CBC WITH DIFFERENTIAL     Status: Abnormal   Collection Time: 04/04/16  7:00 AM  Result Value Ref Range   WBC 1.3 (LL) 4.0 - 10.5 K/uL    Comment: RESULT REPEATED AND VERIFIED CRITICAL RESULT CALLED TO, READ BACK BY AND VERIFIED WITH: DICKERSON,M AT 2297 BY HUFFINES,S ON 04/04/16.    RBC 3.13 (L) 3.87 - 5.11 MIL/uL   Hemoglobin 10.4 (L) 12.0 - 15.0 g/dL   HCT 29.8 (L) 36.0 - 46.0 %   MCV 95.2 78.0 - 100.0 fL   MCH 33.2 26.0 - 34.0 pg   MCHC 34.9 30.0 - 36.0 g/dL   RDW 21.3 (H) 11.5 - 15.5 %   Platelets 186 150 - 400 K/uL   Neutrophils Relative % 33 %   Lymphocytes Relative 50 %   Monocytes Relative 12 %   Eosinophils Relative 5 %   Basophils Relative 0 %   Neutro Abs 0.4 (L) 1.7 - 7.7 K/uL   Lymphs Abs 0.6 (L) 0.7 - 4.0 K/uL   Monocytes Absolute 0.2 0.1 - 1.0 K/uL   Eosinophils Absolute 0.1 0.0 - 0.7 K/uL   Basophils Absolute 0.0 0.0 - 0.1 K/uL  Glucose, capillary     Status: Abnormal   Collection Time: 04/04/16  7:36 AM  Result Value Ref Range   Glucose-Capillary 128 (H) 65 - 99 mg/dL   Comment 1 Notify RN   Glucose, capillary     Status: Abnormal   Collection Time: 04/04/16 11:24 AM  Result Value Ref Range   Glucose-Capillary 151 (H) 65 - 99 mg/dL   Comment 1 Notify RN   Glucose,  capillary     Status: Abnormal   Collection Time: 04/04/16  4:29 PM  Result Value Ref Range   Glucose-Capillary 105 (H) 65 - 99 mg/dL   Comment 1 Notify RN       RADIOGRAPHY:  CLINICAL DATA:  Intermittent abdominal pain and vomiting for 2 weeks. History of Crohn's disease and pancreatitis. History of bowel resection and rectovaginal fistula closure.  EXAM: CT ABDOMEN AND PELVIS WITHOUT CONTRAST  TECHNIQUE: Multidetector CT imaging of the abdomen and pelvis was performed following the standard protocol without IV contrast.  COMPARISON:  MRI abdomen 03/23/2016  FINDINGS: Lower chest: Patchy dependent atelectasis posteriorly within the lung bases. Tiny parenchymal calcifications in the posterior right lung base. Mild right middle and lower lobe subpleural interstitial opacities could relate to mild fibrosis. No acute consolidation or effusion. Heart size is slightly enlarged.  Hepatobiliary: Liver size upper normal at 17 cm. Noncontrast appearance of the liver otherwise unremarkable. Gallbladder slightly prominent but no definite intraluminal stones are visualized. No wall thickening. Common bile duct is slightly tortuous and mildly prominent, measuring up to 8-9  mm; at the head of the pancreas, this measures 6 mm. It is grossly unchanged compared to recent MRI. Pancreatic duct at the head neck and proximal body appears slightly increased in size compared to MRI, it measures approximately 3 mm in diameter. No definite calcified stones are seen in the region of the common bile duct.  Pancreas: No peripancreatic fluid collections. Pancreatic duct mildly prominent, measuring up to 3 mm.  Spleen: Grossly normal non contrasted appearance.  Adrenals/Urinary Tract: Adrenal glands within normal limits. No calcified stones or hydronephrosis involving the kidneys. 2.2 cm low-density lesion in the mid right kidney, corresponding to the cyst described on MRI. Additional 9 mm  hypodense lesion midpole right kidney. There is a 2.4 cm low-density lesion in the mid left kidney, corresponding to the cyst on MRI. Lobulated exophytic 3 cm hypodense lesion from the upper pole of left kidney, corresponds to MRI cyst. Ureters are nondilated. The bladder is normal.  Stomach/Bowel: There is mild gastric wall thickening with suboptimal intra luminal distention. There is no dilated small bowel. The appendix is not well identified. There are patchy areas of wall thickening involving the rectosigmoid colon. No evidence of a perforation. Few scattered colonic diverticula.  Vascular/Lymphatic: Limited without intravenous contrast. Atherosclerotic vascular calcifications of the aorta. Scattered mesenteric and retroperitoneal subcentimeter lymph nodes.  Reproductive: Uterus is not identified and presumably absent. Multiple calcified phleboliths.  Other: Mild soft tissue stranding is present within the right upper quadrant near the fundus of the gallbladder.  Musculoskeletal: Heterogenous attenuation of the bones could relate to a osteopenia. There is evidence of an old left inferior pubic ramus fracture. Mild SI joint sclerosis.  IMPRESSION: 1. Slight dilatation of the gallbladder, no definite intraluminal stones or wall thickening. A small amount of soft tissue thickening/stranding is present in the right upper quadrant near the hepatic margin and adjacent to the gallbladder fundus, this is nonspecific. If signs or symptoms are referable to the gallbladder, further evaluation with ultrasound may be obtained. Common bile duct appears slightly enlarged ; pancreatic duct slightly more prominent compared to recent MRI. Suggest correlation with laboratory values. 2. Patchy wall thickening involving the rectosigmoid colon, could relate to history of Crohn's disease. No evidence for perforation or abscess. 3. Multiple hypodense lesions within the bilateral  kidneys, corresponding to previously noted cysts on MRI. 4. Mild gastric wall thickening, likely related to underdistention although mild gastritis not excluded. 5. Old left inferior pubic ramus fracture.   Electronically Signed   By: Donavan Foil M.D.   On: 03/27/2016 11:17     ASSESSMENT:  Pancytopenia Crohns Iron deficiency Abdominal Pain Transaminitis Elevated Lipase  PLAN: I agree with holding 6-MP. I would not restart until counts have recovered which may take some time. I would not keep her in house for her neutropenia if her diarrhea and abdominal pain are improved and she remains afebrile. I can monitor her counts as an outpatient. She may not be able to tolerate additional 6-MP since she has been on the drug for so long, bone marrow reserve may be affected.   It is important to emphasize that even close periodic monitoring of counts does not always prevent bone marrow suppression or hepatotoxicity, which can be sudden in onset, even after a long duration of treatment Certainly agree with GI that leukopenia, lipase elevation and liver toxicity are likely from 6-MP. 6-MMP level has been sent and pending. Could also check for TMPT deficiency, although if she has been on the drug for  years it may simply be toxicity from prolonged exposure.   If WBC count dose not improve over next 24 to 48 hours can consider BMBX to rule out other marrow process, but I suspect would just see marrow drug effect. Can also consider neupogen. These options can also be pursued as outpatient if her WBC count does not begin to rebound over the next week or so.   Patient notes that Dr. Oneida Alar has discussed other options for therapy of her crohns' and she is open to this.   Will follow-up tomorrow.  CBC with differential in am.  All questions were answered. This note was electronically signed. Gicela Schwarting [employer]

## 2016-04-04 NOTE — Progress Notes (Signed)
    Subjective: Denies abdominal pain, N/V. Feels good. 2 firmer stools this morning. Feels like the consistency is improving.   Objective: Vital signs in last 24 hours: Temp:  [97.8 F (36.6 C)-98.9 F (37.2 C)] 98.3 F (36.8 C) (09/18 0500) Pulse Rate:  [72-79] 77 (09/18 0500) Resp:  [18] 18 (09/18 0500) BP: (113-132)/(62-67) 126/67 (09/18 0500) SpO2:  [97 %-100 %] 97 % (09/18 0500) Last BM Date: 04/01/16 General:   Alert and oriented, pleasant Head:  Normocephalic and atraumatic. Eyes:  No icterus, sclera clear. Conjuctiva pink.  Abdomen:  Deferred, patient standing up at sink getting ready for bath  Extremities:  Without  edema. Neurologic:  Alert and  oriented x4 Psych:  Alert and cooperative.   Intake/Output from previous day: 09/17 0701 - 09/18 0700 In: 1661.7 [P.O.:720; I.V.:691.7; IV Piggyback:250] Out: -  Intake/Output this shift: No intake/output data recorded.  Lab Results:  Recent Labs  04/02/16 1046 04/03/16 0706 04/04/16 0700  WBC 1.1* 1.1* 1.3*  HGB 10.5* 10.2* 10.4*  HCT 31.3* 30.2* 29.8*  PLT 159 173 186   BMET  Recent Labs  04/01/16 1901 04/03/16 0706  NA 138 135  K 3.8 3.3*  CL 107 107  CO2 25 22  GLUCOSE 193* 113*  BUN 11 5*  CREATININE 0.43* 0.34*  CALCIUM 8.6* 8.2*   LFT  Recent Labs  04/01/16 1901 04/03/16 0706  PROT 6.0* 5.8*  ALBUMIN 2.8* 2.7*  AST 72* 44*  ALT 87* 65*  ALKPHOS 70 59  BILITOT 0.5 1.0   PT/INR  Recent Labs  04/03/16 0706  LABPROT 12.7  INR 0.95   Hepatitis Panel  Recent Labs  04/02/16 1659  HEPBSAG Negative  HCVAB <0.1    Assessment: 75 year old female admitted with diarrhea, weakness, pancytopenia with history of Crohn's disease on mercaptopurine for 16 years. Thiopurine metabolites ordered  and sent to [facility], received today in office and reviewed noting: 6MMPN level 11,453 (reference range less than 5700), indicating higher risk of hepatotoxicity. 6TGN level was  Low at 135  (reference range 230-400), unclear clinical significance as this can mean less likelihood of response to medication and less likely to result in leukopenia. Will scan into medical record. Agree with hematology consult in order to evaluate for any other etiology for pancytopenia.   Prior admission with pancreatitis and March 2017 episode of pancreatitis etiology unclear but concern for med effect secondary to mercaptopurine. Gallbladder remains in situ.  History of Crohn's disease, SIBO, with watery stools improving with treatment for SIBO. Not consistent with Crohn's flare. Clinically improving and doing well today.    Plan: Follow-up on TB assay Cipro/Flagyl for total of 5-7 days Hematology Consult Stool studies if diarrhea recurs   Tamara Todd, ANP-BC [facility]    LOS: 3 days    04/04/2016, 8:13 AM

## 2016-04-04 NOTE — Evaluation (Signed)
Physical Therapy Evaluation Patient Details Name: AYZIA DAY MRN: [id number] DOB: 07/06/1941 Today's Date: 04/04/2016   History of Present Illness  75 y.o. female with medical history significant of Crohn's disease, GERD, HTN, HLD, and DM presents to the ED with complaints of upper abdominal pain and excessive diarrhea. Patient also reports generalized weakness and chills. She was recently hospitalized for similar symptoms. She is currently taking mercaptopurine for Crohn's disease that the dose was lowered as it was felt to be causing leukopenia. She was discharged 2 days ago, with plans to follow up with heme/onc outpatient.  She returned with peristent diarhrea and weakness.  Crohn's, DM, GERD, HTN, HTN, osteoporosis, pancreatitis, bacterial overgrown test (+) for Small bowel bacterial overgrowth, s/p bowel resection, rectovaginal fistula closure in 2004.    Clinical Impression  Pt received in bed, and was agreeable to PT evaluation.  Pt is independent with all functional mobility at baseline, and during today's PT evaluation she continues to demonstrate that same independence.  Pt ambulated 229f independent during PT eval.  Pt was encouraged to ambulate in the hallway while she is here. Pt does not demonstrate any skilled PT needs, and therefore, will sign off.  No follow up PT needed.     Follow Up Recommendations No PT follow up    Equipment Recommendations  None recommended by PT    Recommendations for Other Services       Precautions / Restrictions Precautions Precautions: None Restrictions Weight Bearing Restrictions: No      Mobility  Bed Mobility Overal bed mobility: Independent                Transfers Overall transfer level: Independent Equipment used: None                Ambulation/Gait Ambulation/Gait assistance: Independent Ambulation Distance (Feet): 200 Feet Assistive device: None Gait Pattern/deviations: WFL(Within Functional Limits)    Gait velocity interpretation: at or above normal speed for age/gender    Stairs            Wheelchair Mobility    Modified Rankin (Stroke Patients Only)       Balance Overall balance assessment: No apparent balance deficits (not formally assessed)                                           Pertinent Vitals/Pain Pain Assessment: No/denies pain    Home Living   Living Arrangements: Spouse/significant other;Children (son)   Type of Home: House Home Access: Stairs to enter   [employer] Steps: 1 Home Layout: Laundry or work area in basement;Two level Home Equipment: Walker - 2 wheels;Cane - single point;Shower seat      Prior Function Level of Independence: Independent   Gait / Transfers Assistance Needed: independent.   ADL's / Homemaking Assistance Needed: independent  Comments: pt was independent with community level mobility and was driving.      Hand Dominance   Dominant Hand: Right    Extremity/Trunk Assessment   Upper Extremity Assessment: Overall WFL for tasks assessed           Lower Extremity Assessment: Overall WFL for tasks assessed         Communication   Communication: No difficulties  Cognition Arousal/Alertness: Awake/alert Behavior During Therapy: WFL for tasks assessed/performed Overall Cognitive Status: Within Functional Limits for tasks assessed  General Comments      Exercises     Assessment/Plan    PT Assessment Patent does not need any further PT services  PT Problem List            PT Treatment Interventions      PT Goals (Current goals can be found in the Care Plan section)  Acute Rehab PT Goals PT Goal Formulation: All assessment and education complete, DC therapy    Frequency     Barriers to discharge        Co-evaluation               End of Session Equipment Utilized During Treatment: Gait belt Activity Tolerance: Patient  tolerated treatment well Patient left: in chair;with call bell/phone within reach      Functional Assessment Tool Used: [place] "6-clicks"  Functional Limitation: Mobility: Walking and moving around Mobility: Walking and Moving Around Current Status [phone]): 0 percent impaired, limited or restricted Mobility: Walking and Moving Around Goal Status [phone]): 0 percent impaired, limited or restricted Mobility: Walking and Moving Around Discharge Status [phone]): 0 percent impaired, limited or restricted    Time: 1317-1336 PT Time Calculation (min) (ACUTE ONLY): 19 min   Charges:   PT Evaluation $PT Eval Low Complexity: 1 Procedure     PT G Codes:   PT G-Codes **NOT FOR INPATIENT CLASS** Functional Assessment Tool Used: [employer] "6-clicks"  Functional Limitation: Mobility: Walking and moving around Mobility: Walking and Moving Around Current Status [phone]): 0 percent impaired, limited or restricted Mobility: Walking and Moving Around Goal Status [phone]): 0 percent impaired, limited or restricted Mobility: Walking and Moving Around Discharge Status [phone]): 0 percent impaired, limited or restricted    Beth Odai Wimmer, PT, DPT X: [id number]

## 2016-04-05 DIAGNOSIS — D702 Other drug-induced agranulocytosis: Secondary | ICD-10-CM

## 2016-04-05 DIAGNOSIS — K5000 Crohn's disease of small intestine without complications: Principal | ICD-10-CM

## 2016-04-05 LAB — QUANTIFERON TB GOLD ASSAY (BLOOD)

## 2016-04-05 LAB — FOLATE RBC
FOLATE, HEMOLYSATE: 425.1 ng/mL
FOLATE, RBC: 1915 ng/mL (ref >498)
HEMATOCRIT: 22.2 % — AB (ref 34.0–46.6)

## 2016-04-05 LAB — QUANTIFERON IN TUBE
QFT TB AG MINUS NIL VALUE: <0 IU/mL
QUANTIFERON MITOGEN VALUE: 0.71 IU/mL
QUANTIFERON TB AG VALUE: 0.07 IU/mL
QUANTIFERON TB GOLD: NEGATIVE
Quantiferon Nil Value: 0.08 IU/mL

## 2016-04-05 LAB — CBC WITH DIFFERENTIAL/PLATELET
Basophils Absolute: 0 10*3/uL (ref 0.0–0.1)
Basophils Relative: 1 %
EOS ABS: 0.1 10*3/uL (ref 0.0–0.7)
Eosinophils Relative: 5 %
HCT: 30.1 % — ABNORMAL LOW (ref 36.0–46.0)
HEMOGLOBIN: 10 g/dL — AB (ref 12.0–15.0)
LYMPHS ABS: 0.8 10*3/uL (ref 0.7–4.0)
LYMPHS PCT: 52 %
MCH: 31.6 pg (ref 26.0–34.0)
MCHC: 33.2 g/dL (ref 30.0–36.0)
MCV: 95.3 fL (ref 78.0–100.0)
MONOS PCT: 11 %
Monocytes Absolute: 0.2 10*3/uL (ref 0.1–1.0)
NEUTROS PCT: 32 %
Neutro Abs: 0.5 10*3/uL — ABNORMAL LOW (ref 1.7–7.7)
Platelets: 195 10*3/uL (ref 150–400)
RBC: 3.16 MIL/uL — AB (ref 3.87–5.11)
RDW: 21.1 % — ABNORMAL HIGH (ref 11.5–15.5)
WBC: 1.5 10*3/uL — ABNORMAL LOW (ref 4.0–10.5)

## 2016-04-05 LAB — BASIC METABOLIC PANEL
Anion gap: 4 — ABNORMAL LOW (ref 5–15)
BUN: 6 mg/dL (ref 6–20)
CHLORIDE: 110 mmol/L (ref 101–111)
CO2: 23 mmol/L (ref 22–32)
CREATININE: 0.38 mg/dL — AB (ref 0.44–1.00)
Calcium: 8.5 mg/dL — ABNORMAL LOW (ref 8.9–10.3)
GFR calc Af Amer: >60 mL/min (ref >60)
GFR calc non Af Amer: >60 mL/min (ref >60)
GLUCOSE: 115 mg/dL — AB (ref 65–99)
POTASSIUM: 3.4 mmol/L — AB (ref 3.5–5.1)
SODIUM: 137 mmol/L (ref 135–145)

## 2016-04-05 LAB — GLUCOSE, CAPILLARY
GLUCOSE-CAPILLARY: 151 mg/dL — AB (ref 65–99)
Glucose-Capillary: 108 mg/dL — ABNORMAL HIGH (ref 65–99)
Glucose-Capillary: 111 mg/dL — ABNORMAL HIGH (ref 65–99)

## 2016-04-05 LAB — MAGNESIUM: MAGNESIUM: 1.1 mg/dL — AB (ref 1.7–2.4)

## 2016-04-05 MED ORDER — MAGNESIUM CHLORIDE 64 MG PO TBEC: 3.0000 | DELAYED_RELEASE_TABLET | Freq: Two times a day (BID) | ORAL | Status: DC

## 2016-04-05 MED ORDER — POTASSIUM CHLORIDE CRYS ER 20 MEQ PO TBCR
40.0000 meq | EXTENDED_RELEASE_TABLET | Freq: Every day | ORAL | Status: DC
  Administered 2016-04-05: 40 meq via ORAL
  Filled 2016-04-05: qty 2

## 2016-04-05 MED ORDER — CIPROFLOXACIN HCL 250 MG PO TABS
500.0000 mg | ORAL_TABLET | Freq: Two times a day (BID) | ORAL | Status: DC
  Administered 2016-04-05: 500 mg via ORAL
  Filled 2016-04-05: qty 2

## 2016-04-05 MED ORDER — METRONIDAZOLE 500 MG PO TABS: 500.0000 mg | ORAL_TABLET | Freq: Three times a day (TID) | ORAL | 0 refills | Status: AC

## 2016-04-05 MED ORDER — ZOLPIDEM TARTRATE 5 MG PO TABS
5.0000 mg | ORAL_TABLET | Freq: Once | ORAL | Status: AC
  Administered 2016-04-05: 5 mg via ORAL
  Filled 2016-04-05: qty 1

## 2016-04-05 MED ORDER — METRONIDAZOLE 500 MG PO TABS: 500.0000 mg | ORAL_TABLET | Freq: Three times a day (TID) | ORAL | Status: DC

## 2016-04-05 MED ORDER — MAGNESIUM CHLORIDE 64 MG PO TBEC: 1.0000 | DELAYED_RELEASE_TABLET | Freq: Two times a day (BID) | ORAL | 0 refills | Status: DC

## 2016-04-05 MED ORDER — MAGNESIUM SULFATE 2 GM/50ML IV SOLN
2.0000 g | Freq: Once | INTRAVENOUS | Status: DC
  Filled 2016-04-05: qty 50

## 2016-04-05 MED ORDER — MAGNESIUM OXIDE 400 (241.3 MG) MG PO TABS
400.0000 mg | ORAL_TABLET | Freq: Two times a day (BID) | ORAL | Status: DC
  Administered 2016-04-05: 400 mg via ORAL
  Filled 2016-04-05: qty 1

## 2016-04-05 MED ORDER — CIPROFLOXACIN HCL 500 MG PO TABS: 500.0000 mg | ORAL_TABLET | Freq: Two times a day (BID) | ORAL | 0 refills | Status: AC

## 2016-04-05 MED ORDER — POTASSIUM CHLORIDE CRYS ER 20 MEQ PO TBCR: 20.0000 meq | EXTENDED_RELEASE_TABLET | Freq: Every day | ORAL | 0 refills | Status: DC

## 2016-04-05 NOTE — Discharge Summary (Signed)
[facility] Discharge Summary   Patient: Tamara Todd JDB:[id number]   PCP: [name], NP DOB: Dec 13, 1940   Date of admission: 04/01/2016   Date of discharge: 04/05/2016     Discharge Diagnoses:  Active Problems:   Crohn's disease ([place])   Leukopenia   Other acute pancreatitis   Exacerbation of Crohn's disease ([place])   Absolute anemia   Crohn's disease of small intestine without complication ([place])   Chemotherapy adverse reaction   Drug-induced neutropenia ([facility])   Anemia, iron deficiency  Admitted From: Home Disposition:  Home  Recommendations for Outpatient Follow-up:  1. Follow-up with PCP in one week to get her BMP and magnesium level checked 2. Follow-up with gastroenterology to decide regarding initiation of Humira   Follow-up Information    [name], NP. Schedule an appointment as soon as possible for a visit in 1 week(s).   Specialty:  Family Medicine Why:  with BMP and Mg level checked.  Contact information: [address] [place] [address] [phone]        Molli Hazard, MD. Call in 1 week(s).   Specialties:  Hematology and Oncology, Oncology Why:  for follow up appointment  Contact information: [place] [address] [phone]        Jamesetta So, MD. Go on 04/07/2016.   Specialty:  General Surgery Why:  11 am Contact information: [address] [place] [address] [phone]        [facility]. Call in 1 week(s).   Why:  for follow up on test result.  Contact information: [place] [phone]         Diet recommendation: Regular diet  Activity: The patient is advised to gradually reintroduce usual activities.  Discharge Condition: good  Code Status: Full code  History of present illness: As per the H and P dictated on admission, "Tamara Todd is a 75 y.o. female with medical history significant of Crohn's disease,  GERD, HTN, HLD, and DM presents to the ED with complaints of upper abdominal pain and excessive diarrhea. Patient also reports generalized weakness and chills. She was recently hospitalized for similar symptoms. She is currently taking mercaptopurine for Crohn's disease that the dose was lowered as it was felt to be causing leukopenia. She was discharged 2 days ago, with plans to follow up with heme/onc outpatient.  She returned with peristent diarhrea and weakness.  She currently lives at home with her husband and son.  ED Course: While in the ED, glucose 193, creatinine 0.43, lipase 258, AST 72, ALT 87, WBC 1.1, hemoglobin 7.7, MCH 34.7, and MCV 103.2. Patient was admitted for further evaluation of abdominal pain. "  Hospital Course:  Summary of her active problems in the hospital is as following. 1. Crohn's disease exacerbation. Small bowel bacterial overgrowth.  Appreciate GI consultation. Patient was on 6-MP and its dose was reduced recently. Given patient's leukopenia patient will require a different regimen. Continue to follow recommendation from GI. Hematology has been referred. Currently C. difficile is negative. Finishing the Cipro and Flagyl for diarrhea, diarrhea is actually getting better. GI considering the patient to be started on Humira if quantiferron TB gold is negative.  2. Neutropenia. Pancytopenia This is likely in the setting of 6-MP toxicity. 6-MP discontinued. Hematologic consulted.  appreciate input W BC Appear to be improving on its own. Patient status post 1 PRBC transfusion. H&H relatively stable and no active bleeding reported. She will follow up with  hematology as an outpatient  3. History of pancreatitis. Lipase is getting better. Continue close monitoring. GI transitioning the patient to full liquid diet. Patient denies any active pain.  4. Hypertension. Blood pressure stable, continue Norvasc and metoprolol Continue to monitor.  5.  Hypomagnesemia. Changing magnesium oxide to slow magnesium.  All other chronic medical condition were stable during the hospitalization.  Patient was seen by physical therapy, who recommended no further therapy On the day of the discharge the patient's vitals are stable, and no other acute medical condition were reported by patient. the patient was felt safe to be discharge at home with family.  Procedures and Results:  PRBC transfusion   Consultations:  Gastroenterology  Hematology  DISCHARGE MEDICATION: Discharge Medication List as of 04/05/2016  1:38 PM    START taking these medications   Details  ciprofloxacin (CIPRO) 500 MG tablet Take 1 tablet (500 mg total) by mouth 2 (two) times daily., Starting Tue 04/05/2016, Until Thu 04/07/2016, Normal    magnesium chloride (SLOW-MAG) 64 MG TBEC SR tablet Take 1 tablet (64 mg total) by mouth 2 (two) times daily., Starting Tue 04/05/2016, Normal    metroNIDAZOLE (FLAGYL) 500 MG tablet Take 1 tablet (500 mg total) by mouth every 8 (eight) hours., Starting Tue 04/05/2016, Until Thu 04/07/2016, Normal    potassium chloride SA (K-DUR,KLOR-CON) 20 MEQ tablet Take 1 tablet (20 mEq total) by mouth daily., Starting Wed 04/06/2016, Normal      CONTINUE these medications which have NOT CHANGED   Details  acetaminophen (TYLENOL) 650 MG CR tablet Take 1 tablet (650 mg total) by mouth every 8 (eight) hours as needed for pain., Starting Wed 03/30/2016, Normal    amLODipine (NORVASC) 10 MG tablet Take 10 mg by mouth daily., Starting 11/17/2015, Until Discontinued, Historical Med    calcium carbonate (OS-CAL - DOSED IN MG OF ELEMENTAL CALCIUM) 1250 (500 Ca) MG tablet Take 1 tablet by mouth 2 (two) times daily with a meal., Until Discontinued, Historical Med    cyanocobalamin (,VITAMIN B-12,) 1000 MCG/ML injection Inject 1 mL into the muscle every 14 (fourteen) days., Starting 01/08/2016, Until Discontinued, Historical Med    FeFum-FePo-FA-B Cmp-C-Zn-Mn-Cu  (SE-TAN PLUS) 162-115.2-1 MG CAPS Take 1 capsule by mouth daily., Historical Med    HYDROcodone-acetaminophen (NORCO) 5-325 MG tablet Take 1 tablet by mouth every 6 (six) hours as needed for moderate pain., Starting Wed 03/30/2016, Print    lipase/protease/amylase (CREON) 36000 UNITS CPEP capsule Take 72,000 Units by mouth 4 (four) times daily. With meals and snack., Historical Med    metFORMIN (GLUCOPHAGE) 500 MG tablet Take 1 tablet (500 mg total) by mouth 2 (two) times daily., Starting Mon 03/28/2016, No Print    metoprolol succinate (TOPROL-XL) 25 MG 24 hr tablet Take 25 mg by mouth daily., Starting 10/09/2015, Until Discontinued, Historical Med    omeprazole (PRILOSEC) 20 MG capsule Take 20 mg by mouth daily., Starting 10/25/2015, Until Discontinued, Historical Med    phenol (CHLORASEPTIC) 1.4 % LIQD Use as directed 1 spray in the mouth or throat as needed for throat irritation / pain., Starting Wed 03/30/2016, Normal    Vitamin D, Ergocalciferol, (DRISDOL) 50000 units CAPS capsule Take 50,000 Units by mouth once a week. Takes on Mondays., Starting 09/24/2015, Until Discontinued, Historical Med      STOP taking these medications     diclofenac (VOLTAREN) 75 MG EC tablet      mercaptopurine (PURINETHOL) 50 MG tablet        Allergies  Allergen  Reactions  . Lisinopril     Cough   . Remicade [Infliximab] Other (See Comments)    Couldn't breath  . Codeine Rash   Discharge Instructions    Diet general    Complete by:  As directed    Discharge instructions    Complete by:  As directed    It is important that you read following instructions as well as go over your medication list with RN to help you understand your care after this hospitalization.  Discharge Instructions: Please follow-up with PCP in one week, get BMP and Mg checked.  Please request your primary care physician to go over all Hospital Tests and Procedure/Radiological results at the follow up,  Please get all Hospital  records sent to your PCP by signing hospital release before you go home.   Do not take more than prescribed Pain, Sleep and Anxiety Medications. You were cared for by a hospitalist during your hospital stay. If you have any questions about your discharge medications or the care you received while you were in the hospital after you are discharged, you can call the unit and ask to speak with the hospitalist on call if the hospitalist that took care of you is not available.  Once you are discharged, your primary care physician will handle any further medical issues. Please note that NO REFILLS for any discharge medications will be authorized once you are discharged, as it is imperative that you return to your primary care physician (or establish a relationship with a primary care physician if you do not have one) for your aftercare needs so that they can reassess your need for medications and monitor your lab values. You Must read complete instructions/literature along with all the possible adverse reactions/side effects for all the Medicines you take and that have been prescribed to you. Take any new Medicines after you have completely understood and accept all the possible adverse reactions/side effects. Wear Seat belts while driving. If you have smoked or chewed Tobacco in the last 2 yrs please stop smoking and/or stop any Recreational drug use.   Increase activity slowly    Complete by:  As directed      Discharge Exam: Filed Weights   04/01/16 1749 04/01/16 2301  Weight: 49 kg (108 lb) 48.4 kg (106 lb 11.2 oz)   Vitals:   04/05/16 0400 04/05/16 1320  BP: (!) 142/67 111/64  Pulse: 67 76  Resp: 19 18  Temp: 98.6 F (37 C) 98.5 F (36.9 C)   General: Appear in no distress, no Rash; Oral Mucosa moist. Cardiovascular: S1 and S2 Present, no Murmur, no JVD Respiratory: Bilateral Air entry present and Clear to Auscultation, no Crackles, no wheezes Abdomen: Bowel Sound present, Soft and no  tenderness Extremities: no Pedal edema, no calf tenderness Neurology: Grossly no focal neuro deficit.  The results of significant diagnostics from this hospitalization (including imaging, microbiology, ancillary and laboratory) are listed below for reference.    Significant Diagnostic Studies: Ct Abdomen Pelvis Wo Contrast  Result Date: 03/27/2016 CLINICAL DATA:  Intermittent abdominal pain and vomiting for 2 weeks. History of Crohn's disease and pancreatitis. History of bowel resection and rectovaginal fistula closure. EXAM: CT ABDOMEN AND PELVIS WITHOUT CONTRAST TECHNIQUE: Multidetector CT imaging of the abdomen and pelvis was performed following the standard protocol without IV contrast. COMPARISON:  MRI abdomen 03/23/2016 FINDINGS: Lower chest: Patchy dependent atelectasis posteriorly within the lung bases. Tiny parenchymal calcifications in the posterior right lung base. Mild right middle and  lower lobe subpleural interstitial opacities could relate to mild fibrosis. No acute consolidation or effusion. Heart size is slightly enlarged. Hepatobiliary: Liver size upper normal at 17 cm. Noncontrast appearance of the liver otherwise unremarkable. Gallbladder slightly prominent but no definite intraluminal stones are visualized. No wall thickening. Common bile duct is slightly tortuous and mildly prominent, measuring up to 8-9 mm; at the head of the pancreas, this measures 6 mm. It is grossly unchanged compared to recent MRI. Pancreatic duct at the head neck and proximal body appears slightly increased in size compared to MRI, it measures approximately 3 mm in diameter. No definite calcified stones are seen in the region of the common bile duct. Pancreas: No peripancreatic fluid collections. Pancreatic duct mildly prominent, measuring up to 3 mm. Spleen: Grossly normal non contrasted appearance. Adrenals/Urinary Tract: Adrenal glands within normal limits. No calcified stones or hydronephrosis involving the  kidneys. 2.2 cm low-density lesion in the mid right kidney, corresponding to the cyst described on MRI. Additional 9 mm hypodense lesion midpole right kidney. There is a 2.4 cm low-density lesion in the mid left kidney, corresponding to the cyst on MRI. Lobulated exophytic 3 cm hypodense lesion from the upper pole of left kidney, corresponds to MRI cyst. Ureters are nondilated. The bladder is normal. Stomach/Bowel: There is mild gastric wall thickening with suboptimal intra luminal distention. There is no dilated small bowel. The appendix is not well identified. There are patchy areas of wall thickening involving the rectosigmoid colon. No evidence of a perforation. Few scattered colonic diverticula. Vascular/Lymphatic: Limited without intravenous contrast. Atherosclerotic vascular calcifications of the aorta. Scattered mesenteric and retroperitoneal subcentimeter lymph nodes. Reproductive: Uterus is not identified and presumably absent. Multiple calcified phleboliths. Other: Mild soft tissue stranding is present within the right upper quadrant near the fundus of the gallbladder. Musculoskeletal: Heterogenous attenuation of the bones could relate to a osteopenia. There is evidence of an old left inferior pubic ramus fracture. Mild SI joint sclerosis. IMPRESSION: 1. Slight dilatation of the gallbladder, no definite intraluminal stones or wall thickening. A small amount of soft tissue thickening/stranding is present in the right upper quadrant near the hepatic margin and adjacent to the gallbladder fundus, this is nonspecific. If signs or symptoms are referable to the gallbladder, further evaluation with ultrasound may be obtained. Common bile duct appears slightly enlarged ; pancreatic duct slightly more prominent compared to recent MRI. Suggest correlation with laboratory values. 2. Patchy wall thickening involving the rectosigmoid colon, could relate to history of Crohn's disease. No evidence for perforation or  abscess. 3. Multiple hypodense lesions within the bilateral kidneys, corresponding to previously noted cysts on MRI. 4. Mild gastric wall thickening, likely related to underdistention although mild gastritis not excluded. 5. Old left inferior pubic ramus fracture. Electronically Signed   By: Donavan Foil M.D.   On: 03/27/2016 11:17   Mr 3d Recon At Scanner  Result Date: 03/23/2016 CLINICAL DATA:  Idiopathic chronic pancreatitis. Chronic abdominal pain. Crohn's disease. EXAM: MRI ABDOMEN WITHOUT AND WITH CONTRAST (INCLUDING MRCP) TECHNIQUE: Multiplanar multisequence MR imaging of the abdomen was performed both before and after the administration of intravenous contrast. Heavily T2-weighted images of the biliary and pancreatic ducts were obtained, and three-dimensional MRCP images were rendered by post processing. CONTRAST:  33m MULTIHANCE GADOBENATE DIMEGLUMINE 529 MG/ML IV SOLN COMPARISON:  None. FINDINGS: Lower chest:  No acute findings. Hepatobiliary: No mass or other parenchymal abnormality identified. Gallbladder is unremarkable. No evidence of biliary ductal dilatation, with proximal common bile duct measuring  5-6 mm in diameter. No evidence of choledocholithiasis. Pancreas: No mass, inflammatory changes, or other parenchymal abnormality identified. No evidence of pancreatic ductal dilatation or pancreas divisum. Spleen:  Within normal limits in size and appearance. Adrenals/Urinary Tract: Normal adrenal glands. Bilateral benign appearing Bosniak category 1 and 2 renal cysts noted. No masses identified. No evidence of hydronephrosis. Stomach/Bowel: Visualized portions within the abdomen are unremarkable. Vascular/Lymphatic: No pathologically enlarged lymph nodes identified. No abdominal aortic aneurysm demonstrated. Other:  None. Musculoskeletal:  No suspicious bone lesions identified. IMPRESSION: No hepatobiliary abnormality identified. No evidence of biliary ductal dilatation or choledocholithiasis. No  pancreatic abnormality identified. No evidence of pancreatic ductal dilatation or pancreas divisum. Electronically Signed   By: Earle Gell M.D.   On: 03/23/2016 16:08   Mr Jeananne Rama W/wo Cm/mrcp  Result Date: 03/23/2016 CLINICAL DATA:  Idiopathic chronic pancreatitis. Chronic abdominal pain. Crohn's disease. EXAM: MRI ABDOMEN WITHOUT AND WITH CONTRAST (INCLUDING MRCP) TECHNIQUE: Multiplanar multisequence MR imaging of the abdomen was performed both before and after the administration of intravenous contrast. Heavily T2-weighted images of the biliary and pancreatic ducts were obtained, and three-dimensional MRCP images were rendered by post processing. CONTRAST:  61m MULTIHANCE GADOBENATE DIMEGLUMINE 529 MG/ML IV SOLN COMPARISON:  None. FINDINGS: Lower chest:  No acute findings. Hepatobiliary: No mass or other parenchymal abnormality identified. Gallbladder is unremarkable. No evidence of biliary ductal dilatation, with proximal common bile duct measuring 5-6 mm in diameter. No evidence of choledocholithiasis. Pancreas: No mass, inflammatory changes, or other parenchymal abnormality identified. No evidence of pancreatic ductal dilatation or pancreas divisum. Spleen:  Within normal limits in size and appearance. Adrenals/Urinary Tract: Normal adrenal glands. Bilateral benign appearing Bosniak category 1 and 2 renal cysts noted. No masses identified. No evidence of hydronephrosis. Stomach/Bowel: Visualized portions within the abdomen are unremarkable. Vascular/Lymphatic: No pathologically enlarged lymph nodes identified. No abdominal aortic aneurysm demonstrated. Other:  None. Musculoskeletal:  No suspicious bone lesions identified. IMPRESSION: No hepatobiliary abnormality identified. No evidence of biliary ductal dilatation or choledocholithiasis. No pancreatic abnormality identified. No evidence of pancreatic ductal dilatation or pancreas divisum. Electronically Signed   By: JEarle GellM.D.   On: 03/23/2016 16:08     Microbiology: Recent Results (from the past 240 hour(s))  C difficile quick scan w PCR reflex     Status: None   Collection Time: 04/01/16  8:20 PM  Result Value Ref Range Status   C Diff antigen NEGATIVE NEGATIVE Final   C Diff toxin NEGATIVE NEGATIVE Final   C Diff interpretation No C. difficile detected.  Final     Labs: CBC:  Recent Labs Lab 04/01/16 1901 04/01/16 2323 04/02/16 1046 04/03/16 0706 04/04/16 0700 04/05/16 0645  WBC 1.1*  --  1.1* 1.1* 1.3* 1.5*  NEUTROABS 0.4*  --  0.5* 0.4* 0.4* 0.5*  HGB 7.7*  --  10.5* 10.2* 10.4* 10.0*  HCT 22.9* 22.2* 31.3* 30.2* 29.8* 30.1*  MCV 103.2*  --  95.4 94.7 95.2 95.3  PLT 145*  --  159 173 186 1401  Basic Metabolic Panel:  Recent Labs Lab 04/01/16 1901 04/03/16 0706 04/05/16 0645  NA 138 135 137  K 3.8 3.3* 3.4*  CL 107 107 110  CO2 25 22 23   GLUCOSE 193* 113* 115*  BUN 11 5* 6  CREATININE 0.43* 0.34* 0.38*  CALCIUM 8.6* 8.2* 8.5*  MG  --  1.0* 1.1*   Liver Function Tests:  Recent Labs Lab 04/01/16 1901 04/03/16 0706  AST 72* 44*  ALT 87*  65*  ALKPHOS 70 59  BILITOT 0.5 1.0  PROT 6.0* 5.8*  ALBUMIN 2.8* 2.7*    Recent Labs Lab 04/01/16 1901  LIPASE 258*   No results for input(s): AMMONIA in the last 168 hours. Cardiac Enzymes: No results for input(s): CKTOTAL, CKMB, CKMBINDEX, TROPONINI in the last 168 hours. BNP (last 3 results) No results for input(s): BNP in the last 8760 hours. CBG:  Recent Labs Lab 04/04/16 2125 04/04/16 2359 04/05/16 0417 04/05/16 0713 04/05/16 1133  GLUCAP 141* 107* 108* 111* 151*   Time spent: 30 minutes  Signed:  Eliah Marquard  [facility] 04/05/2016 , 4:45 PM

## 2016-04-05 NOTE — Progress Notes (Signed)
REVIEWED-NO ADDITIONAL RECOMMENDATIONS.   Subjective: 2 loose stools this morning. Feels like consistency is better but frequency is not. States orange juice "sets it off". Certain juices "set it off". No abdomina pain. No rectal bleeding. Wants to go home.   Objective: Vital signs in last 24 hours: Temp:  [98.6 F (37 C)-98.7 F (37.1 C)] 98.6 F (37 C) (09/19 0400) Pulse Rate:  [66-67] 67 (09/19 0400) Resp:  [18-19] 19 (09/19 0400) BP: (141-142)/(67) 142/67 (09/19 0400) SpO2:  [100 %] 100 % (09/19 0400) Last BM Date: 04/03/16 General:   Alert and oriented, pleasant Head:  Normocephalic and atraumatic. Eyes:  No icterus, sclera clear. Conjuctiva pink.  Abdomen:  Bowel sounds present, soft, non-tender, non-distended. No HSM or hernias noted. No rebound or guarding. No masses appreciated  Extremities:  Without  edema. Neurologic:  Alert and  oriented x4 Psych:  Alert and cooperative. Normal mood and affect.  Intake/Output from previous day: 09/18 0701 - 09/19 0700 In: 900 [I.V.:550; IV Piggyback:350] Out: 500 [Urine:500] Intake/Output this shift: No intake/output data recorded.  Lab Results:  Recent Labs  04/03/16 0706 04/04/16 0700 04/05/16 0645  WBC 1.1* 1.3* 1.5*  HGB 10.2* 10.4* 10.0*  HCT 30.2* 29.8* 30.1*  PLT 173 186 195   BMET  Recent Labs  04/03/16 0706 04/05/16 0645  NA 135 137  K 3.3* 3.4*  CL 107 110  CO2 22 23  GLUCOSE 113* 115*  BUN 5* 6  CREATININE 0.34* 0.38*  CALCIUM 8.2* 8.5*   LFT  Recent Labs  04/03/16 0706  PROT 5.8*  ALBUMIN 2.7*  AST 44*  ALT 65*  ALKPHOS 59  BILITOT 1.0   PT/INR  Recent Labs  04/03/16 0706  LABPROT 12.7  INR 0.95   Hepatitis Panel  Recent Labs  04/02/16 1659  HEPBSAG Negative  HCVAB <0.1   Assessment:  75 year old female admitted with diarrhea, weakness, pancytopenia with history of Crohn's disease on mercaptopurine for 16 years. Thiopurine metabolites with 6MMPN level 11,453 (reference  range less than 5700), indicating higher risk of hepatotoxicity. 6TGN level was  Low at 135 (reference range 230-400), unclear clinical significance as this can mean less likelihood of response to medication and less likely to result in leukopenia. Will scan into medical record. Hematology consult reviewed and appreciated: likely mercaptopurine as culprit for elevated lipase, elevated LFTs, and leukopenia. Hematology to follow.    History of Crohn's disease, SIBO, with watery stools improving with treatment for SIBO. Not consistent with Crohn's flare. Clinically improving. Appears dietary intake playing a role as well. Appropriate for discharge home with close outpatient follow-up at this point.    Plan: Follow-up on TB assay Cipro/Flagyl for total of 5-7 days (could send home with additional 2 days by mouth to complete a weeks' worth of therapy) Per Hematology could be discharged home if remains afebrile as long as clinically improving; agree with this plan Plan on starting Humira as outpatient if TB assay is negative Agree with discharge home and outpatient follow-up Patient stable for discharge home   Annitta Needs, ANP-BC [facility]     LOS: 4 days    04/05/2016, 8:10 AM

## 2016-04-06 ENCOUNTER — Encounter: Payer: Self-pay | Admitting: "Endocrinology

## 2016-04-06 ENCOUNTER — Telehealth: Payer: Self-pay | Admitting: Gastroenterology

## 2016-04-06 ENCOUNTER — Ambulatory Visit (INDEPENDENT_AMBULATORY_CARE_PROVIDER_SITE_OTHER): Payer: Medicare Other | Admitting: "Endocrinology

## 2016-04-06 VITALS — BP 127/74 | HR 79 | Ht 59.0 in | Wt 107.0 lb

## 2016-04-06 DIAGNOSIS — E1165 Type 2 diabetes mellitus with hyperglycemia: Secondary | ICD-10-CM

## 2016-04-06 DIAGNOSIS — IMO0002 Reserved for concepts with insufficient information to code with codable children: Secondary | ICD-10-CM

## 2016-04-06 DIAGNOSIS — E118 Type 2 diabetes mellitus with unspecified complications: Secondary | ICD-10-CM

## 2016-04-06 DIAGNOSIS — E119 Type 2 diabetes mellitus without complications: Secondary | ICD-10-CM | POA: Insufficient documentation

## 2016-04-06 NOTE — Telephone Encounter (Signed)
Pt is aware and I am mailing her this print out.

## 2016-04-06 NOTE — Progress Notes (Signed)
Subjective:    Patient ID: Tamara Todd, female    DOB: 06/14/1941. Patient is being seen in consultation for management of diabetes requested by  [name], NP  Past Medical History:  Diagnosis Date  . Crohn's disease ([place])    used to be followed by Dr. Algis Greenhouse. diagnosis in her 48s. multiple surgeries. remicade allergy.  . Diabetes ([place])   . GERD (gastroesophageal reflux disease)   . HTN (hypertension)   . Hyperlipidemia   . Hypertension   . Osteoporosis       . Pancreatitis    09/2015   Past Surgical History:  Procedure Laterality Date  . BACTERIAL OVERGROWTH TEST N/A 02/24/2016   POSITIVE for small bowel bacterial overgrowth   . BIOPSY  02/10/2016   Procedure: BIOPSY;  Surgeon: Danie Binder, MD;  Location: [facility];  Service: Endoscopy;;  Random colon biopsies Gastric biopsies for h pylori gastric polyp biopsies  . BOWEL RESECTION     TWICE for stricture, including right colectomy with terminal ileal resection  . COLONOSCOPY    . COLONOSCOPY N/A 02/10/2016   Dr. Oneida Alar: normal neo terminal ileum, diverticula in sigmoid colon, non-bleeding internal hemorrhoids, adenomatous rectal polyp.   . ESOPHAGOGASTRODUODENOSCOPY N/A 02/10/2016   Dr. Oneida Alar: chronic gastritis, small hiatal hernia, few benign-appearing gastric polyps, no source for weight loss.   Marland Kitchen PARTIAL HYSTERECTOMY    . POLYPECTOMY  02/10/2016   Procedure: POLYPECTOMY;  Surgeon: Danie Binder, MD;  Location: [facility];  Service: Endoscopy;;  Rectal polyps x 2 removed 1 by hot snare and number 2 via cold forceps  . RECTOVAGINAL FISTULA CLOSURE  2004   with temporary colostomy and subsequent take down, all at [facility].    Social History   Social History  . Marital status: Married    Spouse name: N/A  . Number of children: 1  . Years of education: N/A   Occupational History  . retired    Social History Main Topics  . Smoking status: Never Smoker  . Smokeless tobacco: Never Used  . Alcohol use No   . Drug use: No  . Sexual activity: Not Asked   Other Topics Concern  . None   Social History Narrative  . None   Outpatient Encounter Prescriptions as of 04/06/2016  Medication Sig  . acetaminophen (TYLENOL) 650 MG CR tablet Take 1 tablet (650 mg total) by mouth every 8 (eight) hours as needed for pain.  Marland Kitchen amLODipine (NORVASC) 10 MG tablet Take 10 mg by mouth daily.  . calcium carbonate (OS-CAL - DOSED IN MG OF ELEMENTAL CALCIUM) 1250 (500 Ca) MG tablet Take 1 tablet by mouth 2 (two) times daily with a meal.  . ciprofloxacin (CIPRO) 500 MG tablet Take 1 tablet (500 mg total) by mouth 2 (two) times daily.  . cyanocobalamin (,VITAMIN B-12,) 1000 MCG/ML injection Inject 1 mL into the muscle every 14 (fourteen) days.  . FeFum-FePo-FA-B Cmp-C-Zn-Mn-Cu (SE-TAN PLUS) 162-115.2-1 MG CAPS Take 1 capsule by mouth daily.  Marland Kitchen HYDROcodone-acetaminophen (NORCO) 5-325 MG tablet Take 1 tablet by mouth every 6 (six) hours as needed for moderate pain.  Marland Kitchen lipase/protease/amylase (CREON) 36000 UNITS CPEP capsule Take 72,000 Units by mouth 4 (four) times daily. With meals and snack.  . magnesium chloride (SLOW-MAG) 64 MG TBEC SR tablet Take 1 tablet (64 mg total) by mouth 2 (two) times daily.  . metoprolol succinate (TOPROL-XL) 25 MG 24 hr tablet Take 25 mg by mouth daily.  Marland Kitchen  metroNIDAZOLE (FLAGYL) 500 MG tablet Take 1 tablet (500 mg total) by mouth every 8 (eight) hours.  Marland Kitchen omeprazole (PRILOSEC) 20 MG capsule Take 20 mg by mouth daily.  . phenol (CHLORASEPTIC) 1.4 % LIQD Use as directed 1 spray in the mouth or throat as needed for throat irritation / pain.  . potassium chloride SA (K-DUR,KLOR-CON) 20 MEQ tablet Take 1 tablet (20 mEq total) by mouth daily.  . Vitamin D, Ergocalciferol, (DRISDOL) 50000 units CAPS capsule Take 50,000 Units by mouth once a week. Takes on Mondays.  . [DISCONTINUED] metFORMIN (GLUCOPHAGE) 500 MG tablet Take 1 tablet (500 mg total) by mouth 2 (two) times daily.   No  facility-administered encounter medications on file as of 04/06/2016.    ALLERGIES: Allergies  Allergen Reactions  . Lisinopril     Cough   . Remicade [Infliximab] Other (See Comments)    Couldn't breath  . Codeine Rash   VACCINATION STATUS:  There is no immunization history on file for this patient.  Diabetes  She presents for her initial diabetic visit. She has type 2 diabetes mellitus. Onset time: She was diagnosed at approximate age of 39 years . Her disease course has been stable. There are no hypoglycemic associated symptoms. Pertinent negatives for hypoglycemia include no confusion, headaches, pallor or seizures. There are no diabetic associated symptoms. Pertinent negatives for diabetes include no chest pain, no polydipsia, no polyphagia and no polyuria. There are no hypoglycemic complications. Symptoms are stable. There are no diabetic complications. Risk factors for coronary artery disease include diabetes mellitus and sedentary lifestyle. Current diabetic treatment includes oral agent (monotherapy). She is compliant with treatment most of the time. Her weight is decreasing steadily. She is following a generally unhealthy diet. When asked about meal planning, she reported none. She has not had a previous visit with a dietitian. She rarely participates in exercise. Home blood sugar record trend: She did not bring the meter nor logs to review today. An ACE inhibitor/angiotensin II receptor blocker is contraindicated (She is allergic to ACE inhibitor's.).       Review of Systems  Constitutional: Negative for chills, fever and unexpected weight change.  HENT: Negative for trouble swallowing and voice change.   Eyes: Negative for visual disturbance.  Respiratory: Negative for cough, shortness of breath and wheezing.   Cardiovascular: Negative for chest pain, palpitations and leg swelling.  Gastrointestinal: Negative for diarrhea, nausea and vomiting.  Endocrine: Negative for cold  intolerance, heat intolerance, polydipsia, polyphagia and polyuria.  Musculoskeletal: Negative for arthralgias and myalgias.  Skin: Negative for color change, pallor, rash and wound.  Neurological: Negative for seizures and headaches.  Psychiatric/Behavioral: Negative for confusion and suicidal ideas.    Objective:    BP 127/74   Pulse 79   Ht 4' 11"  (1.499 m)   Wt 107 lb (48.5 kg)   BMI 21.61 kg/m   Wt Readings from Last 3 Encounters:  04/06/16 107 lb (48.5 kg)  04/01/16 106 lb 11.2 oz (48.4 kg)  03/27/16 108 lb 11.2 oz (49.3 kg)    Physical Exam  Constitutional: She is oriented to person, place, and time.  Light build.  HENT:  Head: Normocephalic and atraumatic.  Eyes: EOM are normal.  Neck: Normal range of motion. Neck supple. No tracheal deviation present. No thyromegaly present.  Cardiovascular: Normal rate and regular rhythm.   Pulmonary/Chest: Effort normal and breath sounds normal.  Abdominal: Soft. Bowel sounds are normal. There is no tenderness. There is no guarding.  Musculoskeletal:  Normal range of motion. She exhibits no edema.  Neurological: She is alert and oriented to person, place, and time. She has normal reflexes. No cranial nerve deficit. Coordination normal.  Skin: Skin is warm and dry. No rash noted. No erythema. No pallor.  Psychiatric: She has a normal mood and affect. Judgment normal.     CMP ( most recent) CMP     Component Value Date/Time   NA 137 04/05/2016 0645   K 3.4 (L) 04/05/2016 0645   CL 110 04/05/2016 0645   CO2 23 04/05/2016 0645   GLUCOSE 115 (H) 04/05/2016 0645   BUN 6 04/05/2016 0645   CREATININE 0.38 (L) 04/05/2016 0645   CREATININE 0.77 12/29/2015 1241   CALCIUM 8.5 (L) 04/05/2016 0645   PROT 5.8 (L) 04/03/2016 0706   ALBUMIN 2.7 (L) 04/03/2016 0706   AST 44 (H) 04/03/2016 0706   ALT 65 (H) 04/03/2016 0706   ALKPHOS 59 04/03/2016 0706   BILITOT 1.0 04/03/2016 0706   GFRNONAA >60 04/05/2016 0645   GFRAA >60 04/05/2016  0645     Diabetic Labs (most recent): Lab Results  Component Value Date   HGBA1C 6.5 (H) 04/01/2016     Assessment & Plan:   1. Uncontrolled type 2 diabetes mellitus with complication, without long-term current use of insulin ([place])   - Patient has currently controlled symptomatic type 2 DM since  75 years of age,  with most recent A1c of 6.5 %. Recent labs reviewed. He has lost a significant amount of weight due to her Crohn's disease.  -I discussed with the patient the need to avoid acute and chronic complications of diabetes which include CAD, CVA, CKD, retinopathy, and neuropathy.  - I have counseled the patient on diet management  by adopting a carbohydrate restricted/protein rich diet.  - I encouraged the patient to switch to  unprocessed or minimally processed complex starch and increased protein intake (animal or plant source), fruits, and vegetables.  - Patient is advised to stick to a routine mealtimes to eat 3 meals  a day and  some snacks if necessary.  - The patient will be scheduled with Jearld Fenton, RDN, CDE for individualized DM education.  - I have approached patient with the following individualized plan to manage diabetes and patient agrees:   - I  will proceed with no medications, start strict monitoring of glucose  AC and HS. - She will return in 1 week with her meter and logs to decide if she needs therapy. - If therapy is necessary in her case, insulin would be a better choice for her.  -Patient is encouraged to call clinic for blood glucose levels less than 70 or above 300 mg /dl.   - Patient is not a good candidate for incretin therapy, SGLt2 inhibitors.  - Patient specific target  A1c;  LDL, HDL, Triglycerides, and  Waist Circumference were discussed in detail.  2) BP/HTN:controlled. she reports allergies to ACE inhibitors. 3) Lipids/HPL:controlled unknown, I will obtain fasting lipid panel on subsequent visits.   Weight/Diet:  she is light build,  CDE Consult will be initiated , exercise, and detailed carbohydrates information provided.  5) Chronic Care/Health Maintenance:  -Patient is not on ACEI/ARB and Statin medications and encouraged to continue to follow up with Ophthalmology, Podiatrist at least yearly or according to recommendations, and advised to   stay away from smoking. I have recommended yearly flu vaccine and pneumonia vaccination at least every 5 years; moderate intensity exercise for up to  150 minutes weekly; and  sleep for at least 7 hours a day.  - 60 minutes of time was spent on the care of this patient , 50% of which was applied for counseling on diabetes complications and their preventions.  - Patient to bring meter and  blood glucose logs during their next visit.   - I advised patient to maintain close follow up with [name], NP for primary care needs.  Follow up plan: - Return in about 1 week (around 04/13/2016) for follow up with meter and logs- no labs.  Glade Lloyd, MD Phone: [phone]  Fax: [phone]   04/06/2016, 2:45 PM

## 2016-04-06 NOTE — Telephone Encounter (Signed)
PLEASE CALL PT. Her TB test is negative. THE PAPERWORK HAS BEEN SUBMITTED FOR HUMIRA. SHE NEEDS TO COMPLETE THE FOLLOWING VACCINES: FLU SHOT, THE TWO SERIES PNEUMONIA VACCINE-PREVNAR FOLLOWED BY PNEUMOVAX IN 8 WEEKS prior to initiating the [place].

## 2016-04-08 LAB — MISC LABCORP TEST (SEND OUT): Labcorp test code: 503800

## 2016-04-11 ENCOUNTER — Telehealth: Payer: Self-pay

## 2016-04-11 NOTE — Telephone Encounter (Signed)
Labs done outside source dated 04/06/2016 1# White blood cell count 2.08L, hemoglobin 11L, hematocrit 33.8L, platelets 282,000, absolute segmented neutrophils 0.99 low, absolute lymphocyte 0.78 low 2# BUN 14, creatinine 0.54 3# Total bilirubin 0.3, alkaline phosphatase 91, AST 38H, ALT 61H, albumin 2.8L, lipase 672H (normal 73-393)  Leukopenia, anemia improving off 6-mp. AST/ALT elevated, stable Lipase elevated (note different reference range from our facility).  Lab documents to be scanned. Patient is to keep upcoming appointment with [place].  Routing to hematology and Dr. Oneida Alar for review.

## 2016-04-11 NOTE — Telephone Encounter (Signed)
Noted  

## 2016-04-11 NOTE — Telephone Encounter (Signed)
REVIEWED. WILL NOT START HUMIRA UNTIL NEUTROPENIA RESOLVES.

## 2016-04-11 NOTE — Telephone Encounter (Signed)
Outside labs received dated 04/06/2016 and placed on the desk for Neil Crouch, PA to review.  Included was BMP, CBC with Diff, Lipase, and Hepatic Profile.

## 2016-04-12 NOTE — Telephone Encounter (Signed)
noted 

## 2016-04-14 ENCOUNTER — Ambulatory Visit (INDEPENDENT_AMBULATORY_CARE_PROVIDER_SITE_OTHER): Payer: Medicare Other | Admitting: "Endocrinology

## 2016-04-14 ENCOUNTER — Encounter: Payer: Self-pay | Admitting: "Endocrinology

## 2016-04-14 VITALS — BP 109/69 | HR 71 | Ht 59.0 in | Wt 104.0 lb

## 2016-04-14 DIAGNOSIS — I10 Essential (primary) hypertension: Secondary | ICD-10-CM | POA: Diagnosis not present

## 2016-04-14 DIAGNOSIS — E559 Vitamin D deficiency, unspecified: Secondary | ICD-10-CM | POA: Diagnosis not present

## 2016-04-14 DIAGNOSIS — E1165 Type 2 diabetes mellitus with hyperglycemia: Secondary | ICD-10-CM

## 2016-04-14 DIAGNOSIS — E118 Type 2 diabetes mellitus with unspecified complications: Secondary | ICD-10-CM

## 2016-04-14 DIAGNOSIS — IMO0002 Reserved for concepts with insufficient information to code with codable children: Secondary | ICD-10-CM

## 2016-04-14 NOTE — Progress Notes (Signed)
Subjective:    Patient ID: Tamara Todd, female    DOB: 05/04/1941. Patient is being seen in  F/u  for management of diabetes requested by  [name], NP  Past Medical History:  Diagnosis Date  . Crohn's disease ([place])    used to be followed by Dr. Algis Greenhouse. diagnosis in her 89s. multiple surgeries. remicade allergy.  . Diabetes ([place])   . GERD (gastroesophageal reflux disease)   . HTN (hypertension)   . Hyperlipidemia   . Hypertension   . Osteoporosis       . Pancreatitis    09/2015   Past Surgical History:  Procedure Laterality Date  . BACTERIAL OVERGROWTH TEST N/A 02/24/2016   POSITIVE for small bowel bacterial overgrowth   . BIOPSY  02/10/2016   Procedure: BIOPSY;  Surgeon: Danie Binder, MD;  [facility];  Service: Endoscopy;;  Random colon biopsies Gastric biopsies for h pylori gastric polyp biopsies  . BOWEL RESECTION     TWICE for stricture, including right colectomy with terminal ileal resection  . COLONOSCOPY    . COLONOSCOPY N/A 02/10/2016   Dr. Oneida Alar: normal neo terminal ileum, diverticula in sigmoid colon, non-bleeding internal hemorrhoids, adenomatous rectal polyp.   . ESOPHAGOGASTRODUODENOSCOPY N/A 02/10/2016   Dr. Oneida Alar: chronic gastritis, small hiatal hernia, few benign-appearing gastric polyps, no source for weight loss.   Marland Kitchen PARTIAL HYSTERECTOMY    . POLYPECTOMY  02/10/2016   Procedure: POLYPECTOMY;  Surgeon: Danie Binder, MD;  [facility];  Service: Endoscopy;;  Rectal polyps x 2 removed 1 by hot snare and number 2 via cold forceps  . RECTOVAGINAL FISTULA CLOSURE  2004   with temporary colostomy and subsequent take down, all at [facility].    Social History   Social History  . Marital status: Married    Spouse name: N/A  . Number of children: 1  . Years of education: N/A   Occupational History  . retired    Social History Main Topics  . Smoking status: Never Smoker  . Smokeless tobacco: Never Used  . Alcohol use No  .  Drug use: No  . Sexual activity: Not Asked   Other Topics Concern  . None   Social History Narrative  . None   Outpatient Encounter Prescriptions as of 04/14/2016  Medication Sig  . acetaminophen (TYLENOL) 650 MG CR tablet Take 1 tablet (650 mg total) by mouth every 8 (eight) hours as needed for pain.  Marland Kitchen amLODipine (NORVASC) 10 MG tablet Take 10 mg by mouth daily.  . calcium carbonate (OS-CAL - DOSED IN MG OF ELEMENTAL CALCIUM) 1250 (500 Ca) MG tablet Take 1 tablet by mouth 2 (two) times daily with a meal.  . cyanocobalamin (,VITAMIN B-12,) 1000 MCG/ML injection Inject 1 mL into the muscle every 14 (fourteen) days.  . FeFum-FePo-FA-B Cmp-C-Zn-Mn-Cu (SE-TAN PLUS) 162-115.2-1 MG CAPS Take 1 capsule by mouth daily.  Marland Kitchen HYDROcodone-acetaminophen (NORCO) 5-325 MG tablet Take 1 tablet by mouth every 6 (six) hours as needed for moderate pain.  Marland Kitchen lipase/protease/amylase (CREON) 36000 UNITS CPEP capsule Take 72,000 Units by mouth 4 (four) times daily. With meals and snack.  . magnesium chloride (SLOW-MAG) 64 MG TBEC SR tablet Take 1 tablet (64 mg total) by mouth 2 (two) times daily.  . metoprolol succinate (TOPROL-XL) 25 MG 24 hr tablet Take 25 mg by mouth daily.  Marland Kitchen omeprazole (PRILOSEC) 20 MG capsule Take 20 mg by mouth daily.  . phenol (CHLORASEPTIC) 1.4 %  LIQD Use as directed 1 spray in the mouth or throat as needed for throat irritation / pain.  . potassium chloride SA (K-DUR,KLOR-CON) 20 MEQ tablet Take 1 tablet (20 mEq total) by mouth daily.  . Vitamin D, Ergocalciferol, (DRISDOL) 50000 units CAPS capsule Take 50,000 Units by mouth once a week. Takes on Mondays.   No facility-administered encounter medications on file as of 04/14/2016.    ALLERGIES: Allergies  Allergen Reactions  . Lisinopril     Cough   . Remicade [Infliximab] Other (See Comments)    Couldn't breath  . Codeine Rash   VACCINATION STATUS:  There is no immunization history on file for this patient.  Diabetes  She  presents for her follow-up diabetic visit. She has type 2 diabetes mellitus. Onset time: She was diagnosed at approximate age of 70 years . Her disease course has been stable. There are no hypoglycemic associated symptoms. Pertinent negatives for hypoglycemia include no confusion, headaches, pallor or seizures. There are no diabetic associated symptoms. Pertinent negatives for diabetes include no chest pain, no polydipsia, no polyphagia and no polyuria. There are no hypoglycemic complications. Symptoms are stable. There are no diabetic complications. Risk factors for coronary artery disease include diabetes mellitus and sedentary lifestyle. Current diabetic treatment includes oral agent (monotherapy). She is compliant with treatment most of the time. Her weight is increasing steadily. She is following a generally unhealthy diet. When asked about meal planning, she reported none. She has not had a previous visit with a dietitian. She rarely participates in exercise. Her breakfast blood glucose range is generally 110-130 mg/dl. Her lunch blood glucose range is generally 130-140 mg/dl. Her dinner blood glucose range is generally 130-140 mg/dl. Her highest blood glucose is 140-180 mg/dl. Her overall blood glucose range is 130-140 mg/dl. An ACE inhibitor/angiotensin II receptor blocker is contraindicated (She is allergic to ACE inhibitor's.).       Review of Systems  Constitutional: Negative for chills, fever and unexpected weight change.  HENT: Negative for trouble swallowing and voice change.   Eyes: Negative for visual disturbance.  Respiratory: Negative for cough, shortness of breath and wheezing.   Cardiovascular: Negative for chest pain, palpitations and leg swelling.  Gastrointestinal: Negative for diarrhea, nausea and vomiting.  Endocrine: Negative for cold intolerance, heat intolerance, polydipsia, polyphagia and polyuria.  Musculoskeletal: Negative for arthralgias and myalgias.  Skin: Negative  for color change, pallor, rash and wound.  Neurological: Negative for seizures and headaches.  Psychiatric/Behavioral: Negative for confusion and suicidal ideas.    Objective:    BP 109/69   Pulse 71   Ht 4' 11"  (1.499 m)   Wt 104 lb (47.2 kg)   BMI 21.01 kg/m   Wt Readings from Last 3 Encounters:  04/14/16 104 lb (47.2 kg)  04/06/16 107 lb (48.5 kg)  04/01/16 106 lb 11.2 oz (48.4 kg)    Physical Exam  Constitutional: She is oriented to person, place, and time.  Light build.  HENT:  Head: Normocephalic and atraumatic.  Eyes: EOM are normal.  Neck: Normal range of motion. Neck supple. No tracheal deviation present. No thyromegaly present.  Cardiovascular: Normal rate and regular rhythm.   Pulmonary/Chest: Effort normal and breath sounds normal.  Abdominal: Soft. Bowel sounds are normal. There is no tenderness. There is no guarding.  Musculoskeletal: Normal range of motion. She exhibits no edema.  Neurological: She is alert and oriented to person, place, and time. She has normal reflexes. No cranial nerve deficit. Coordination normal.  Skin:  Skin is warm and dry. No rash noted. No erythema. No pallor.  Psychiatric: She has a normal mood and affect. Judgment normal.     CMP ( most recent) CMP     Component Value Date/Time   NA 137 04/05/2016 0645   K 3.4 (L) 04/05/2016 0645   CL 110 04/05/2016 0645   CO2 23 04/05/2016 0645   GLUCOSE 115 (H) 04/05/2016 0645   BUN 6 04/05/2016 0645   CREATININE 0.38 (L) 04/05/2016 0645   CREATININE 0.77 12/29/2015 1241   CALCIUM 8.5 (L) 04/05/2016 0645   PROT 5.8 (L) 04/03/2016 0706   ALBUMIN 2.7 (L) 04/03/2016 0706   AST 44 (H) 04/03/2016 0706   ALT 65 (H) 04/03/2016 0706   ALKPHOS 59 04/03/2016 0706   BILITOT 1.0 04/03/2016 0706   GFRNONAA >60 04/05/2016 0645   GFRAA >60 04/05/2016 0645     Diabetic Labs (most recent): Lab Results  Component Value Date   HGBA1C 6.5 (H) 04/01/2016     Assessment & Plan:   1.  Uncontrolled type 2 diabetes mellitus with complication, without long-term current use of insulin ([place])   - Patient has currently controlled symptomatic type 2 DM since  75 years of age,  with most recent A1c of 6.5 %. Recent labs reviewed. He has lost a significant amount of weight due to her Crohn's disease.  -I discussed with the patient the need to avoid acute and chronic complications of diabetes which include CAD, CVA, CKD, retinopathy, and neuropathy.  - I have counseled the patient on diet management  by adopting a carbohydrate restricted/protein rich diet.  - I encouraged the patient to switch to  unprocessed or minimally processed complex starch and increased protein intake (animal or plant source), fruits, and vegetables.  - Patient is advised to stick to a routine mealtimes to eat 3 meals  a day and  some snacks if necessary.  - The patient will be scheduled with Jearld Fenton, RDN, CDE for individualized DM education.  - I have approached patient with the following individualized plan to manage diabetes and patient agrees:   - She came with 28 readings in the last 7 days averaging 134 with no medications.   - At this time, anti-diabetes therapies unnecessary. - If therapy is necessary in her case, insulin would be a better choice for her.  - She will return in 8 weeks with repeat labs. - Patient is not a good candidate for incretin therapy, SGLt2 inhibitors.  - Patient specific target  A1c;  LDL, HDL, Triglycerides, and  Waist Circumference were discussed in detail.  2) BP/HTN:controlled. she reports allergies to ACE inhibitors. 3) Lipids/HPL:control unknown, I will obtain fasting lipid panel on subsequent visits.   Weight/Diet:  she is light build, CDE Consult will be initiated , exercise, and detailed carbohydrates information provided.  4) vitamin D deficiency: I advised her to continue vitamin D 50,000 units weekly for the next 12 weeks.  5) Chronic Care/Health  Maintenance:  -Patient is not on ACEI/ARB and Statin medications and encouraged to continue to follow up with Ophthalmology, Podiatrist at least yearly or according to recommendations, and advised to   stay away from smoking. I have recommended yearly flu vaccine and pneumonia vaccination at least every 5 years; moderate intensity exercise for up to 150 minutes weekly; and  sleep for at least 7 hours a day.  - 25 minutes of time was spent on the care of this patient , 50% of which was  applied for counseling on diabetes complications and their preventions.  - Patient to bring meter and  blood glucose logs during their next visit.   - I advised patient to maintain close follow up with [name], NP for primary care needs.  Follow up plan: - Return in about 8 weeks (around 06/09/2016) for follow up with pre-visit labs.  Glade Lloyd, MD Phone: [phone]  Fax: [phone]   04/14/2016, 1:54 PM

## 2016-04-19 ENCOUNTER — Telehealth: Payer: Self-pay

## 2016-04-19 NOTE — Telephone Encounter (Signed)
Vaccination record received from [facility] and placed in box for Neil Crouch, PA to review.

## 2016-04-27 ENCOUNTER — Encounter: Payer: Self-pay | Admitting: Nurse Practitioner

## 2016-04-27 ENCOUNTER — Ambulatory Visit (INDEPENDENT_AMBULATORY_CARE_PROVIDER_SITE_OTHER): Payer: Medicare Other | Admitting: Nurse Practitioner

## 2016-04-27 VITALS — BP 114/71 | HR 82 | Temp 98.6°F | Ht 59.0 in | Wt 106.2 lb

## 2016-04-27 DIAGNOSIS — K5000 Crohn's disease of small intestine without complications: Secondary | ICD-10-CM

## 2016-04-27 DIAGNOSIS — K859 Acute pancreatitis without necrosis or infection, unspecified: Secondary | ICD-10-CM

## 2016-04-27 LAB — CBC WITH DIFFERENTIAL/PLATELET
BASOS PCT: 0 %
Basophils Absolute: 0 cells/uL (ref 0–200)
EOS PCT: 2 %
Eosinophils Absolute: 106 cells/uL (ref 15–500)
HCT: 34 % — ABNORMAL LOW (ref 35.0–45.0)
HEMOGLOBIN: 11 g/dL — AB (ref 11.7–15.5)
LYMPHS ABS: 1537 {cells}/uL (ref 850–3900)
Lymphocytes Relative: 29 %
MCH: 32.5 pg (ref 27.0–33.0)
MCHC: 32.4 g/dL (ref 32.0–36.0)
MCV: 100.6 fL — ABNORMAL HIGH (ref 80.0–100.0)
MPV: 9.4 fL (ref 7.5–12.5)
Monocytes Absolute: 530 cells/uL (ref 200–950)
Monocytes Relative: 10 %
NEUTROS ABS: 3127 {cells}/uL (ref 1500–7800)
NEUTROS PCT: 59 %
Platelets: 111 10*3/uL — ABNORMAL LOW (ref 140–400)
RBC: 3.38 MIL/uL — AB (ref 3.80–5.10)
RDW: 21.6 % — ABNORMAL HIGH (ref 11.0–15.0)
WBC: 5.3 10*3/uL (ref 3.8–10.8)

## 2016-04-27 LAB — COMPREHENSIVE METABOLIC PANEL
ALBUMIN: 3.4 g/dL — AB (ref 3.6–5.1)
ALK PHOS: 77 U/L (ref 33–130)
ALT: 29 U/L (ref 6–29)
AST: 36 U/L — AB (ref 10–35)
BUN: 18 mg/dL (ref 7–25)
CO2: 28 mmol/L (ref 20–31)
Calcium: 9.5 mg/dL (ref 8.6–10.4)
Chloride: 106 mmol/L (ref 98–110)
Creat: 0.57 mg/dL — ABNORMAL LOW (ref 0.60–0.93)
GLUCOSE: 88 mg/dL (ref 65–99)
Potassium: 4.3 mmol/L (ref 3.5–5.3)
SODIUM: 139 mmol/L (ref 135–146)
Total Bilirubin: 0.2 mg/dL (ref 0.2–1.2)
Total Protein: 6.5 g/dL (ref 6.1–8.1)

## 2016-04-27 LAB — LIPASE: Lipase: 74 U/L — ABNORMAL HIGH (ref 7–60)

## 2016-04-27 NOTE — Progress Notes (Signed)
Referring Provider: [name], NP Primary Care Physician:  [name], NP Primary GI:  Dr. Oneida Alar  Chief Complaint  Patient presents with  . Follow-up    doing ok    HPI:   Tamara Todd is a 75 y.o. female who presents for posthospitalization and postprocedure follow-up. The patient was recently admitted to the hospital from 04/01/2016 through 04/05/2016 for exacerbation of Crohn's disease, anemia, and acute pancreatitis. In the ED she complained of excessive diarrhea, generalized weakness and chills. She was on mercaptopurine for Crohn's disease but the dose was decreased recently due to felt to be causing leukopenia. In the ED her creatinine was 0.43, lipase 258, AST/ALT 72/87, hemoglobin 7.7. C. difficile was found to be negative, she was finishing Cipro and Flagyl for diarrhea which was improving. Consider starting Humira if TB is negative.  Per phone notes the patient's TB test was negative in paperwork had been submitted for Humira. Recommended she complete flu shot, to series pneumonia vaccine colon prep nor followed by Pneumovax in 8 weeks, prior to initiating Humira. Follow-up labs at an outside facility were completed which found continued neutropenia with white blood cell count of 2.08, hemoglobin improved to 11, platelets 282, absolute segmented neutrophils 0.99 (low), absolute lymphocyte 0.78 (low). Creatinine normal at 0.54, CMP found total bili 0.3, alkaline phosphatase 91, improvement and AST/ALT at 38/61, lipase at 672. It was decided to hold off on Humira until leukopenia resolves.  Today she states she's doing well overall. States she had the flu shot but was told she didn't need the pneumonia vaccine because she's already had it. No abdominal pain lately, did have some minimally immediately after discharge. Was having frequent diarrhea for the first 24-48 hours after discharge, but things seems to hevae settled down. Is currently having about 3 bowel movements a day,  is typically loose. Denies hematochezia, melena. Stools are dark green. Energy level is a little better each week, but still not where it should be. Appetite is good. Denies chest pain, dyspnea, dizziness, lightheadedness, syncope, near syncope. Denies any other upper or lower GI symptoms.  Past Medical History:  Diagnosis Date  . Crohn's disease ([place])    used to be followed by Dr. Algis Greenhouse. diagnosis in her 37s. multiple surgeries. remicade allergy.  . Diabetes ([place])   . GERD (gastroesophageal reflux disease)   . HTN (hypertension)   . Hyperlipidemia   . Hypertension   . Osteoporosis       . Pancreatitis    09/2015    Past Surgical History:  Procedure Laterality Date  . BACTERIAL OVERGROWTH TEST N/A 02/24/2016   POSITIVE for small bowel bacterial overgrowth   . BIOPSY  02/10/2016   Procedure: BIOPSY;  Surgeon: Danie Binder, MD;  [facility];  Service: Endoscopy;;  Random colon biopsies Gastric biopsies for h pylori gastric polyp biopsies  . BOWEL RESECTION     TWICE for stricture, including right colectomy with terminal ileal resection  . COLONOSCOPY    . COLONOSCOPY N/A 02/10/2016   Dr. Oneida Alar: normal neo terminal ileum, diverticula in sigmoid colon, non-bleeding internal hemorrhoids, adenomatous rectal polyp.   . ESOPHAGOGASTRODUODENOSCOPY N/A 02/10/2016   Dr. Oneida Alar: chronic gastritis, small hiatal hernia, few benign-appearing gastric polyps, no source for weight loss.   Marland Kitchen PARTIAL HYSTERECTOMY    . POLYPECTOMY  02/10/2016   Procedure: POLYPECTOMY;  Surgeon: Danie Binder, MD;  [facility];  Service: Endoscopy;;  Rectal polyps x 2 removed 1  by hot snare and number 2 via cold forceps  . 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     Current Outpatient Prescriptions  Medication Sig Dispense Refill  . acetaminophen (TYLENOL) 650 MG CR tablet Take 1 tablet (650 mg total) by mouth every 8 (eight)  hours as needed for pain. 30 tablet 0  . amLODipine (NORVASC) 10 MG tablet Take 10 mg by mouth daily.  1  . calcium carbonate (OS-CAL - DOSED IN MG OF ELEMENTAL CALCIUM) 1250 (500 Ca) MG tablet Take 1 tablet by mouth 2 (two) times daily with a meal.    . cyanocobalamin (,VITAMIN B-12,) 1000 MCG/ML injection Inject 1 mL into the muscle every 14 (fourteen) days.  3  . FeFum-FePo-FA-B Cmp-C-Zn-Mn-Cu (SE-TAN PLUS) 162-115.2-1 MG CAPS Take 1 capsule by mouth daily.    Marland Kitchen HYDROcodone-acetaminophen (NORCO) 5-325 MG tablet Take 1 tablet by mouth every 6 (six) hours as needed for moderate pain. 20 tablet 0  . lipase/protease/amylase (CREON) 36000 UNITS CPEP capsule Take 72,000 Units by mouth 4 (four) times daily. With meals and snack.    . magnesium chloride (SLOW-MAG) 64 MG TBEC SR tablet Take 1 tablet (64 mg total) by mouth 2 (two) times daily. 60 tablet 0  . metoprolol succinate (TOPROL-XL) 25 MG 24 hr tablet Take 25 mg by mouth daily.  3  . omeprazole (PRILOSEC) 20 MG capsule Take 20 mg by mouth daily.  3  . phenol (CHLORASEPTIC) 1.4 % LIQD Use as directed 1 spray in the mouth or throat as needed for throat irritation / pain. 1 Bottle 0  . potassium chloride SA (K-DUR,KLOR-CON) 20 MEQ tablet Take 1 tablet (20 mEq total) by mouth daily. 30 tablet 0  . Vitamin D, Ergocalciferol, (DRISDOL) 50000 units CAPS capsule Take 50,000 Units by mouth once a week. Takes on Mondays.  1   No current facility-administered medications for this visit.     Allergies as of 04/27/2016 - Review Complete 04/27/2016  Allergen Reaction Noted  . Lisinopril  12/08/2015  . Remicade [infliximab] Other (See Comments) 12/08/2015  . Codeine Rash 12/08/2015    Family History  Problem Relation Age of Onset  . Colon cancer Father     53s  . Inflammatory bowel disease Sister     ???  . Colon cancer Brother     104    Social History   Social History  . Marital status: Married    Spouse name: N/A  . Number of children: 1    . Years of education: N/A   Occupational History  . retired    Social History Main Topics  . Smoking status: Never Smoker  . Smokeless tobacco: Never Used  . Alcohol use No  . Drug use: No  . Sexual activity: Not Asked   Other Topics Concern  . None   Social History Narrative  . None    Review of Systems: Complete ROS negative except as per HPI.   Physical Exam: BP 114/71   Pulse 82   Temp 98.6 F (37 C) (Oral)   Ht 4' 11"  (1.499 m)   Wt 106 lb 3.2 oz (48.2 kg)   BMI 21.45 kg/m  General:   Alert and oriented. Pleasant and cooperative. Well-nourished and well-developed.  Eyes:  Without icterus, sclera clear and conjunctiva pink.  Ears:  Normal auditory acuity. Cardiovascular:  S1, S2 present without murmurs appreciated. Extremities without clubbing or edema. Respiratory:  Clear to auscultation bilaterally. No wheezes, rales, or rhonchi. No distress.  Gastrointestinal:  +BS, soft, and non-distended. Mild generalized discomfort to palpation. No HSM noted. No guarding or rebound. No masses appreciated.  Rectal:  Deferred  Musculoskalatal:  Symmetrical without gross deformities Neurologic:  Alert and oriented x4;  grossly normal neurologically. Psych:  Alert and cooperative. Normal mood and affect. Heme/Lymph/Immune: No excessive bruising noted.    04/27/2016 3:20 PM   Disclaimer: This note was dictated with voice recognition software. Similar sounding words can inadvertently be transcribed and may not be corrected upon review.

## 2016-04-27 NOTE — Assessment & Plan Note (Signed)
Patient with a history of Crohn's disease was previously on Mercaptopurine which was dose-decreased and eventually discontinued due to leucopenia. She is currently symptomatically improved although she is not on any medications for her Crohn disease. She was going to be started on Humira, however it was decided we need to wait until her leukopenia resolves. She has completed her vaccines, per her. I will try to find a vaccination records that were sent to our office to verify. TB negative. Today I'll check a CBC, CMP to follow-up on kidney and liver function as well as leukopenia. I will have the prior authorization nurse check the status of her edema or paperwork and have her sign the patient education and patient assistance paperwork, and she has not artery done so. Return for follow-up in 4 weeks.

## 2016-04-27 NOTE — Assessment & Plan Note (Signed)
Patient with significant symptomatic improvement. I'll recheck a lipase today. Return for follow-up in 4 weeks.

## 2016-04-27 NOTE — Patient Instructions (Signed)
1. Have your labs drawn when you're able to. 2. I will check the status of your paperwork for Humira 3. Return for follow-up in 4 weeks.

## 2016-04-28 NOTE — Progress Notes (Signed)
CC'ED TO PCP 

## 2016-05-05 NOTE — Progress Notes (Signed)
Called, many rings and no answer.

## 2016-05-09 NOTE — Progress Notes (Signed)
Called. Many rings and no answer. Mailing a letter to call.

## 2016-05-12 ENCOUNTER — Ambulatory Visit: Payer: Medicare (Managed Care) | Admitting: Nurse Practitioner

## 2016-05-16 ENCOUNTER — Telehealth: Payer: Self-pay

## 2016-05-16 NOTE — Telephone Encounter (Signed)
Pt received letter and called and is aware of her lab results and of her appt with Dr. Oneida Alar on 06/02/2016.

## 2016-05-18 ENCOUNTER — Encounter (HOSPITAL_COMMUNITY): Payer: Self-pay | Admitting: Hematology & Oncology

## 2016-05-18 ENCOUNTER — Encounter (HOSPITAL_COMMUNITY): Payer: Medicare Other

## 2016-05-18 ENCOUNTER — Encounter (HOSPITAL_COMMUNITY): Payer: Medicare Other | Attending: Hematology & Oncology | Admitting: Hematology & Oncology

## 2016-05-18 DIAGNOSIS — K5090 Crohn's disease, unspecified, without complications: Secondary | ICD-10-CM

## 2016-05-18 DIAGNOSIS — D61811 Other drug-induced pancytopenia: Secondary | ICD-10-CM

## 2016-05-18 DIAGNOSIS — D649 Anemia, unspecified: Secondary | ICD-10-CM | POA: Diagnosis not present

## 2016-05-18 DIAGNOSIS — R7401 Elevation of levels of liver transaminase levels: Secondary | ICD-10-CM

## 2016-05-18 DIAGNOSIS — D61818 Other pancytopenia: Secondary | ICD-10-CM | POA: Diagnosis not present

## 2016-05-18 DIAGNOSIS — K5000 Crohn's disease of small intestine without complications: Secondary | ICD-10-CM | POA: Insufficient documentation

## 2016-05-18 DIAGNOSIS — R740 Nonspecific elevation of levels of transaminase and lactic acid dehydrogenase [LDH]: Secondary | ICD-10-CM

## 2016-05-18 LAB — CBC WITH DIFFERENTIAL/PLATELET
Basophils Absolute: 0 10*3/uL (ref 0.0–0.1)
Basophils Relative: 0 %
Eosinophils Absolute: 0.2 10*3/uL (ref 0.0–0.7)
Eosinophils Relative: 3 %
HCT: 34.4 % — ABNORMAL LOW (ref 36.0–46.0)
Hemoglobin: 11.4 g/dL — ABNORMAL LOW (ref 12.0–15.0)
Lymphocytes Relative: 33 %
Lymphs Abs: 1.8 10*3/uL (ref 0.7–4.0)
MCH: 33 pg (ref 26.0–34.0)
MCHC: 33.1 g/dL (ref 30.0–36.0)
MCV: 99.7 fL (ref 78.0–100.0)
Monocytes Absolute: 0.3 10*3/uL (ref 0.1–1.0)
Monocytes Relative: 5 %
Neutro Abs: 3.3 10*3/uL (ref 1.7–7.7)
Neutrophils Relative %: 59 %
Platelets: 129 10*3/uL — ABNORMAL LOW (ref 150–400)
RBC: 3.45 MIL/uL — ABNORMAL LOW (ref 3.87–5.11)
RDW: 19.6 % — ABNORMAL HIGH (ref 11.5–15.5)
WBC: 5.9 10*3/uL (ref 4.0–10.5)

## 2016-05-18 LAB — IRON AND TIBC
Iron: 88 ug/dL (ref 28–170)
Saturation Ratios: 21 % (ref 10.4–31.8)
TIBC: 417 ug/dL (ref 250–450)
UIBC: 329 ug/dL

## 2016-05-18 LAB — RETICULOCYTES
RBC.: 3.45 MIL/uL — ABNORMAL LOW (ref 3.87–5.11)
RETIC CT PCT: 1.5 % (ref 0.4–3.1)
Retic Count, Absolute: 51.8 10*3/uL (ref 19.0–186.0)

## 2016-05-18 LAB — SEDIMENTATION RATE: Sed Rate: 46 mm/hr — ABNORMAL HIGH (ref 0–22)

## 2016-05-18 LAB — FERRITIN: FERRITIN: 173 ng/mL (ref 11–307)

## 2016-05-18 LAB — VITAMIN B12: Vitamin B-12: 832 pg/mL (ref 180–914)

## 2016-05-18 LAB — FOLATE: Folate: 39.7 ng/mL (ref >5.9)

## 2016-05-18 LAB — TSH: TSH: 0.486 u[IU]/mL (ref 0.350–4.500)

## 2016-05-18 NOTE — Patient Instructions (Signed)
[place] at [facility] Discharge Instructions  RECOMMENDATIONS MADE BY THE CONSULTANT AND ANY TEST RESULTS WILL BE SENT TO YOUR REFERRING PHYSICIAN.  You saw Dr.Penland today. Lab work today. Follow up in 3 months with labs.  See Amy at checkout for appointments.  Thank you for choosing [place] at [facility] to provide your oncology and hematology care.  To afford each patient quality time with our provider, please arrive at least 15 minutes before your scheduled appointment time.   Beginning January 23rd 2017 lab work for the [facility] will be done in the  Main lab at [facility] on 1st floor. If you have a lab appointment with the [place] please come in thru the  Main Entrance and check in at the main information desk  You need to re-schedule your appointment should you arrive 10 or more minutes late.  We strive to give you quality time with our providers, and arriving late affects you and other patients whose appointments are after yours.  Also, if you no show three or more times for appointments you may be dismissed from the clinic at the providers discretion.     Again, thank you for choosing [facility].  Our hope is that these requests will decrease the amount of time that you wait before being seen by our physicians.       _____________________________________________________________  Should you have questions after your visit to [facility], please contact our office at [phone] between the hours of 8:30 a.m. and 4:30 p.m.  Voicemails left after 4:30 p.m. will not be returned until the following business day.  For prescription refill requests, have your pharmacy contact our office.         Resources For Cancer Patients and their Caregivers ? American Cancer Society: Can assist with transportation, wigs, general needs, runs Look Good Feel Better.        517-319-9309 ? Cancer  Care: Provides financial assistance, online support groups, medication/co-pay assistance.  1-800-813-HOPE (641) 294-8030) ? [facility] Assists [place] cancer patients and their families through emotional , educational and financial support.  905-164-1277 ? [place] DSS Where to apply for food stamps, Medicaid and utility assistance. (305)761-2907 ? [facility]: Transportation to medical appointments. 406-631-3255 ? Social Security Administration: May apply for disability if have a Stage IV cancer. 820-105-8688 581-711-6833 ? [employer], Disability and Transit Services: Assists with nutrition, care and transit needs. [place] Support Programs: @10RELATIVEDAYS @ > Cancer Support Group  2nd Tuesday of the month 1pm-2pm, Journey Room  > Creative Journey  3rd Tuesday of the month 1130am-1pm, Journey Room  > Look Good Feel Better  1st Wednesday of the month 10am-12 noon, Journey Room (Call [place] to register [phone])

## 2016-05-18 NOTE — Progress Notes (Signed)
Tamara Ditch, NP [address] 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 female returns for follow-up of her blood counts after recent hospitalization. She has a known history of Crohns disease and had previously been on 6-MP for 15 years. Prior to her recent presentation to [facility] patient had been admitted to [facility] where her medication was held secondary to abnormal blood counts.  She presented to the ED at [facility] on 03/27/2016 with abdominal pain. She was noted to have abnormal LFT's and lipase. She was pancytopenic with a total WBC count of 1.4 and ANC of 1000, hemoglobin of 9.2 gm/dl and normal platelet count. Only prior labs available for comparison are from 12/29/2015 and show a similar anemia but normal WBC count and differential and platelet count.   Labs from 12/29/2015 did show and underlying iron deficiency with iron saturation at 16%, and ferritin at 29 ng/ml. She states she takes iron. She denies ever having IV iron replacement  Labs continue to improve off 6-MP.   I personally reviewed the labs with the patient.   She states that her appetite is good, but she has lost weight. She says that for the last year she has been gradually losing weight. She is experiencing diarrhea., but it is getting better. She is also experiencing some gas, but denies any discomfort. She denies any abdominal pain while eating.   She denies a fever.  She has an appointment with Dr. Oneida Alar on 06/02/2016.  She has a mammogram scheduled in December in [place].    She has received her flu shot.     is allergic to lisinopril; mercaptopurine; remicade [infliximab]; and codeine.  MEDICAL HISTORY: Past Medical History:  Diagnosis Date  . Crohn's disease ([place])    used to be followed by Dr. Algis Greenhouse. diagnosis in her 41s. multiple surgeries. remicade allergy.  . Diabetes ([place])   . GERD  (gastroesophageal reflux disease)   . HTN (hypertension)   . Hyperlipidemia   . Hypertension   . Osteoporosis       . Pancreatitis    09/2015    SURGICAL HISTORY: Past Surgical History:  Procedure Laterality Date  . BACTERIAL OVERGROWTH TEST N/A 02/24/2016   POSITIVE for small bowel bacterial overgrowth   . BIOPSY  02/10/2016   Procedure: BIOPSY;  Surgeon: Danie Binder, MD;  Location: [facility];  Service: Endoscopy;;  Random colon biopsies Gastric biopsies for h pylori gastric polyp biopsies  . BOWEL RESECTION     TWICE for stricture, including right colectomy with terminal ileal resection  . COLONOSCOPY    . COLONOSCOPY N/A 02/10/2016   Dr. Oneida Alar: normal neo terminal ileum, diverticula in sigmoid colon, non-bleeding internal hemorrhoids, adenomatous rectal polyp.   . ESOPHAGOGASTRODUODENOSCOPY N/A 02/10/2016   Dr. Oneida Alar: chronic gastritis, small hiatal hernia, few benign-appearing gastric polyps, no source for weight loss.   Marland Kitchen PARTIAL HYSTERECTOMY    . POLYPECTOMY  02/10/2016   Procedure: POLYPECTOMY;  Surgeon: Danie Binder, MD;  Location: [facility];  Service: Endoscopy;;  Rectal polyps x 2 removed 1 by hot snare and number 2 via cold forceps  . RECTOVAGINAL FISTULA CLOSURE  2004   with temporary colostomy and subsequent take down, all at [facility].     SOCIAL HISTORY: Social History   Social History  . Marital status: Married    Spouse name: N/A  . Number  of children: 1  . Years of education: N/A   Occupational History  . retired    Social History Main Topics  . Smoking status: Never Smoker  . Smokeless tobacco: Never Used  . Alcohol use No  . Drug use: No  . Sexual activity: Not on file   Other Topics Concern  . Not on file   Social History Narrative  . No narrative on file    FAMILY HISTORY: Family History  Problem Relation Age of Onset  . Colon cancer Father     12s  . Inflammatory bowel disease Sister     ???  . Colon cancer Brother     7     Review of Systems  Constitutional: Positive for weight loss. Negative for fever.  HENT: Negative.   Eyes: Negative.   Respiratory: Negative.   Cardiovascular: Negative.   Gastrointestinal: Positive for diarrhea.       Diarrhea is getting better Pos gas, but no discomfort  Genitourinary: Negative.   Musculoskeletal: Negative.   Skin: Negative.   Neurological: Negative.   Endo/Heme/Allergies: Negative.   Psychiatric/Behavioral: Negative.   All other systems reviewed and are negative. 14 point review of systems was performed and is negative except as detailed under history of present illness and above   PHYSICAL EXAMINATION  ECOG PERFORMANCE STATUS: 1 - Symptomatic but completely ambulatory  Vitals - 1 value per visit 43/32/9518  SYSTOLIC 841  DIASTOLIC 71  Pulse 82  Temperature 98.6  Respirations   Weight (lb) 106.2  Height 4' 11"   BMI 21.45  VISIT REPORT     Physical Exam  Constitutional: She is oriented to person, place, and time and well-developed, well-nourished, and in no distress.  HENT:  Head: Normocephalic and atraumatic.  Mouth/Throat: No oropharyngeal exudate.  Eyes: EOM are normal. Pupils are equal, round, and reactive to light. No scleral icterus.  Neck: Normal range of motion. Neck supple.  Cardiovascular: Normal rate, regular rhythm and normal heart sounds.   Pulmonary/Chest: Effort normal and breath sounds normal. No respiratory distress.  Abdominal: Soft. Bowel sounds are normal. She exhibits no distension and no mass. There is no tenderness. There is no rebound and no guarding.  Musculoskeletal: Normal range of motion. She exhibits no edema.  Lymphadenopathy:    She has no cervical adenopathy.  Neurological: She is alert and oriented to person, place, and time. No cranial nerve deficit. Gait normal.  Skin: Skin is warm and dry.  Psychiatric: Mood, memory, affect and judgment normal.  Nursing note and vitals reviewed.   LABORATORY DATA: I  have reviewed the labs below.  CBC    Component Value Date/Time   WBC 5.9 05/18/2016 1300   RBC 3.45 (L) 05/18/2016 1300   RBC 3.45 (L) 05/18/2016 1300   HGB 11.4 (L) 05/18/2016 1300   HCT 34.4 (L) 05/18/2016 1300   HCT 22.2 (L) 04/01/2016 2323   PLT 129 (L) 05/18/2016 1300   MCV 99.7 05/18/2016 1300   MCH 33.0 05/18/2016 1300   MCHC 33.1 05/18/2016 1300   RDW 19.6 (H) 05/18/2016 1300   LYMPHSABS 1.8 05/18/2016 1300   MONOABS 0.3 05/18/2016 1300   EOSABS 0.2 05/18/2016 1300   BASOSABS 0.0 05/18/2016 1300    CMP     Component Value Date/Time   NA 139 04/27/2016 1559   K 4.3 04/27/2016 1559   CL 106 04/27/2016 1559   CO2 28 04/27/2016 1559   GLUCOSE 88 04/27/2016 1559   BUN 18 04/27/2016 1559  CREATININE 0.57 (L) 04/27/2016 1559   CALCIUM 9.5 04/27/2016 1559   PROT 6.5 04/27/2016 1559   ALBUMIN 3.4 (L) 04/27/2016 1559   AST 36 (H) 04/27/2016 1559   ALT 29 04/27/2016 1559   ALKPHOS 77 04/27/2016 1559   BILITOT 0.2 04/27/2016 1559   GFRNONAA >60 04/05/2016 0645   GFRAA >60 04/05/2016 0645         RADIOGRAPHIC STUDIES:  I have personally reviewed the radiological images as listed and agreed with the findings in the report. Study Result   CLINICAL DATA:  Intermittent abdominal pain and vomiting for 2 weeks. History of Crohn's disease and pancreatitis. History of bowel resection and rectovaginal fistula closure.  EXAM: CT ABDOMEN AND PELVIS WITHOUT CONTRAST  TECHNIQUE: Multidetector CT imaging of the abdomen and pelvis was performed following the standard protocol without IV contrast.  COMPARISON:  MRI abdomen 03/23/2016  FINDINGS: Lower chest: Patchy dependent atelectasis posteriorly within the lung bases. Tiny parenchymal calcifications in the posterior right lung base. Mild right middle and lower lobe subpleural interstitial opacities could relate to mild fibrosis. No acute consolidation or effusion. Heart size is slightly  enlarged.  Hepatobiliary: Liver size upper normal at 17 cm. Noncontrast appearance of the liver otherwise unremarkable. Gallbladder slightly prominent but no definite intraluminal stones are visualized. No wall thickening. Common bile duct is slightly tortuous and mildly prominent, measuring up to 8-9 mm; at the head of the pancreas, this measures 6 mm. It is grossly unchanged compared to recent MRI. Pancreatic duct at the head neck and proximal body appears slightly increased in size compared to MRI, it measures approximately 3 mm in diameter. No definite calcified stones are seen in the region of the common bile duct.  Pancreas: No peripancreatic fluid collections. Pancreatic duct mildly prominent, measuring up to 3 mm.  Spleen: Grossly normal non contrasted appearance.  Adrenals/Urinary Tract: Adrenal glands within normal limits. No calcified stones or hydronephrosis involving the kidneys. 2.2 cm low-density lesion in the mid right kidney, corresponding to the cyst described on MRI. Additional 9 mm hypodense lesion midpole right kidney. There is a 2.4 cm low-density lesion in the mid left kidney, corresponding to the cyst on MRI. Lobulated exophytic 3 cm hypodense lesion from the upper pole of left kidney, corresponds to MRI cyst. Ureters are nondilated. The bladder is normal.  Stomach/Bowel: There is mild gastric wall thickening with suboptimal intra luminal distention. There is no dilated small bowel. The appendix is not well identified. There are patchy areas of wall thickening involving the rectosigmoid colon. No evidence of a perforation. Few scattered colonic diverticula.  Vascular/Lymphatic: Limited without intravenous contrast. Atherosclerotic vascular calcifications of the aorta. Scattered mesenteric and retroperitoneal subcentimeter lymph nodes.  Reproductive: Uterus is not identified and presumably absent. Multiple calcified phleboliths.  Other: Mild soft  tissue stranding is present within the right upper quadrant near the fundus of the gallbladder.  Musculoskeletal: Heterogenous attenuation of the bones could relate to a osteopenia. There is evidence of an old left inferior pubic ramus fracture. Mild SI joint sclerosis.  IMPRESSION: 1. Slight dilatation of the gallbladder, no definite intraluminal stones or wall thickening. A small amount of soft tissue thickening/stranding is present in the right upper quadrant near the hepatic margin and adjacent to the gallbladder fundus, this is nonspecific. If signs or symptoms are referable to the gallbladder, further evaluation with ultrasound may be obtained. Common bile duct appears slightly enlarged ; pancreatic duct slightly more prominent compared to recent MRI. Suggest correlation with  laboratory values. 2. Patchy wall thickening involving the rectosigmoid colon, could relate to history of Crohn's disease. No evidence for perforation or abscess. 3. Multiple hypodense lesions within the bilateral kidneys, corresponding to previously noted cysts on MRI. 4. Mild gastric wall thickening, likely related to underdistention although mild gastritis not excluded. 5. Old left inferior pubic ramus fracture.   Electronically Signed   By: Donavan Foil M.D.   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 Counts are recovering nicely now that she is off 6-MP. She is currently following with Dr. Oneida Alar. Additional therapy for her Crohns will be considered if needed.  Labs will be drawn after our visit today, including a W29, folic acid and ferritin. She will be apprised of the results. Will replace B12 etc accordingly.  Last CBC and CMP reviewed. Results are noted above. LFT's have normalized.   She  will return for a follow up in 3-4 months. At this next visit, if labs have normalized, will formally discharge her back to her PCP and GI. She is in agreement with the plan as detailed.   Orders Placed This Encounter  Procedures  . CBC with Differential  . Sedimentation rate  . Ferritin  . Iron and TIBC  . Vitamin B12  . Folate  . Reticulocytes  . TSH  . CBC with Differential    Standing Status:   Future    Standing Expiration Date:   05/18/2017    All questions were answered. The patient knows to call the clinic with any problems, questions or concerns. We can certainly see the patient much sooner if necessary. This note was electronically signed.  This document serves as a record of services personally performed by Ancil Linsey, MD. It was created on her behalf by Martinique Casey, a trained medical scribe. The creation of this record is based on the scribe's personal observations and the provider's statements to them. This document has been checked and approved by the attending provider.  I have reviewed the above documentation for accuracy and completeness and I agree with the above.  Molli Hazard, MD 06/12/2016

## 2016-06-02 ENCOUNTER — Encounter: Payer: Self-pay | Admitting: Gastroenterology

## 2016-06-02 ENCOUNTER — Ambulatory Visit (INDEPENDENT_AMBULATORY_CARE_PROVIDER_SITE_OTHER): Payer: Medicare Other | Admitting: Gastroenterology

## 2016-06-02 DIAGNOSIS — K5000 Crohn's disease of small intestine without complications: Secondary | ICD-10-CM

## 2016-06-02 NOTE — Progress Notes (Signed)
ON RECALL  °

## 2016-06-02 NOTE — Progress Notes (Signed)
Subjective:    Patient ID: Tamara Todd, female    DOB: [birth year], 75 y.o.   MRN: [id number]  [name], NP  HPI Gas started back since she started feeling better. Eating better. Milk: none. CHEESE: RARE. ICE CREAM: NO RARE EATS OUT. BMS: 2-3X/DAY(SOFT). EATS ONIONS BUT NOT BROCCOLI, CAULIFLOWER, OR GARLIC. RARE SALADS. BREAKFAST THIS AM: OATMEAL(EVERY AM),  EGGS, SAUSAGE, TOAST, STRAWBERRY(WHITE). LUNCH: HAM SANDWICH. DINNER LAST NIGHT: SUB-STEAK AND CHEESE. BACK PAIN: OFTEN-DOING TOO MUCH. HAS A DRY COUGH OFF AND ON. FLU SHOT/PNEUMONIA SHOT: < 5 YEARS.    PT DENIES FEVER, CHILLS, HEMATOCHEZIA, HEMATEMESIS, nausea, vomiting, melena, diarrhea, CHEST PAIN, SHORTNESS OF BREATH,  CHANGE IN BOWEL IN HABITS, constipation, abdominal pain, problems swallowing, OR heartburn or indigestion.  Past Medical History:  Diagnosis Date  . Crohn's disease ([place])    used to be followed by Dr. Algis Greenhouse. diagnosis in her 47s. multiple surgeries. remicade allergy.  . Diabetes ([place])   . GERD (gastroesophageal reflux disease)   . HTN (hypertension)   . Hyperlipidemia   . Hypertension   . Osteoporosis       . Pancreatitis    09/2015   Past Surgical History:  Procedure Laterality Date  . BACTERIAL OVERGROWTH TEST N/A 02/24/2016   POSITIVE for small bowel bacterial overgrowth   . BIOPSY  02/10/2016   Procedure: BIOPSY;  Surgeon: Danie Binder, MD;  [facility];  Service: Endoscopy;;  Random colon biopsies Gastric biopsies for h pylori gastric polyp biopsies  . BOWEL RESECTION     TWICE for stricture, including right colectomy with terminal ileal resection  . COLONOSCOPY    . COLONOSCOPY N/A 02/10/2016   Dr. Oneida Alar: normal neo terminal ileum, diverticula in sigmoid colon, non-bleeding internal hemorrhoids, adenomatous rectal polyp.   . ESOPHAGOGASTRODUODENOSCOPY N/A 02/10/2016   Dr. Oneida Alar: chronic gastritis, small hiatal hernia, few benign-appearing gastric polyps, no source for weight  loss.   Marland Kitchen PARTIAL HYSTERECTOMY    . POLYPECTOMY  02/10/2016   Procedure: POLYPECTOMY;  Surgeon: Danie Binder, MD;  Location: [facility];  Service: Endoscopy;;  Rectal polyps x 2 removed 1 by hot snare and number 2 via cold forceps  . RECTOVAGINAL FISTULA CLOSURE  2004   with temporary colostomy and subsequent take down, all at [facility].    Allergies  Allergen Reactions  . Lisinopril     Cough   . Remicade [Infliximab] Other (See Comments)    Couldn't breath  . Codeine Rash    Current Outpatient Prescriptions  Medication Sig Dispense Refill  . acetaminophen (TYLENOL) 650 MG CR tablet Take 1 tablet (650 mg total) by mouth every 8 (eight) hours as needed for pain.    Marland Kitchen amLODipine (NORVASC) 10 MG tablet Take 10 mg by mouth daily.    . calcium carbonate (OS-CAL - DOSED IN MG OF ELEMENTAL CALCIUM) 1250 (500 Ca) MG tablet Take 1 tablet by mouth 2 (two) times daily with a meal.    . cyanocobalamin (,VITAMIN B-12,) 1000 MCG/ML injection Inject 1 mL into the muscle every 14 (fourteen) days.    . magnesium chloride (SLOW-MAG) 64 MG TBEC SR tablet Take 1 tablet (64 mg total) by mouth 2 (two) times daily.    . metoprolol succinate (TOPROL-XL) 25 MG 24 hr tablet Take 25 mg by mouth daily.    Marland Kitchen omeprazole (PRILOSEC) 20 MG capsule Take 20 mg by mouth daily.    . phenol (CHLORASEPTIC) 1.4 % LIQD Use as directed 1 spray  in the mouth or throat as needed for throat irritation / pain.    . potassium chloride SA (K-DUR,KLOR-CON) 20 MEQ tablet Take 1 tablet (20 mEq total) by mouth daily.    . Vitamin D, Ergocalciferol, (DRISDOL) 50000 units CAPS capsule Take 50,000 Units by mouth once a week. Takes on Mondays.    .       Review of Systems     Objective:   Physical Exam  Constitutional: She is oriented to person, place, and time. She appears well-developed and well-nourished. No distress.  HENT:  Head: Normocephalic and atraumatic.  Mouth/Throat: Oropharynx is clear and moist. No oropharyngeal  exudate.  Eyes: Pupils are equal, round, and reactive to light. No scleral icterus.  Neck: Normal range of motion. Neck supple.  Cardiovascular: Normal rate, regular rhythm and normal heart sounds.   Pulmonary/Chest: Effort normal and breath sounds normal. No respiratory distress.  Abdominal: Soft. Bowel sounds are normal. She exhibits no distension. There is no tenderness.  Musculoskeletal: She exhibits no edema.  Lymphadenopathy:    She has no cervical adenopathy.  Neurological: She is alert and oriented to person, place, and time.  Psychiatric: She has a normal mood and affect.  Vitals reviewed.     Assessment & Plan:

## 2016-06-02 NOTE — Assessment & Plan Note (Signed)
SYMPTOMS FAIRLY WELL CONTROLLED. Last had mercaptopurine sep 2017. Ct showed mild disease in left colon.  CONTINUE TO MONITOR SYMPTOMS. May need to start [place]. CALL WITH QUESTIONS OR CONCERNS: ABDOMINAL PAIN/DIARRHEA. AVOID ITEMS THAT CAUSE BLOATING AND GAS.  HANDOUT GIVEN. TRY BEANO OR SIMETHICONE IF NEEDED FOR GAS AND BLOATING TRY BIOTIN TO PREVENT HAIR LOSS. PLEASE CALL IF YOU THINK YOU NEED ANTIBIOTICS FOR GAS, BLOATING, AND PAIN. FOLLOW UP IN 4 MOS.   GREATER THAN 50% WAS SPENT IN COUNSELING & COORDINATION OF CARE WITH THE PATIENT: DISCUSSED DIFFERENTIAL DIAGNOSIS, PROCEDURE, BENEFITS, RISKS, AND MANAGEMENT OF CROHN'S DISEASE. TOTAL ENCOUNTER TIME: 25 MINS.

## 2016-06-02 NOTE — Patient Instructions (Addendum)
PLEASE CALL WITH QUESTIONS OR CONCERNS: ABDOMINAL PAIN/DIARRHEA.  AVOID ITEMS THAT CAUSE BLOATING AND GAS. SEE INFO BELOW.  TRY BEANO OR SIMETHICONE IF NEEDED FOR GAS AND BLOATING  TRY BIOTIN TO PREVENT HAIR LOSS.  PLEASE CALL IF YOU THINK YOU NEED ANTIBIOTICS FOR GAS, BLOATING, AND PAIN.  FOLLOW UP IN 4 MOS.   BLOATING AND GAS PREVENTION  Although gas may be uncomfortable and embarrassing, it is not life-threatening. Understanding causes, ways to reduce symptoms, and treatment will help most people find some relief.  Points to remember . Everyone has gas in the digestive tract. Marland Kitchen People often believe normal passage of gas to be excessive. . Gas comes from two main sources: swallowed air and normal breakdown of certain foods by harmless bacteria naturally present in the large intestine. . Many foods with carbohydrates can cause gas. Fats and proteins cause little gas. . Foods that may cause gas include o beans  o vegetables, such as broccoli, cabbage, brussels sprouts, onions, artichokes, and asparagus  o fruits, such as pears, apples, and peaches  o whole grains, such as whole wheat and bran  o soft drinks and fruit drinks  o milk and milk products, such as cheese and ice cream, and packaged foods prepared with lactose, such as bread, cereal, and salad dressing  o foods containing sorbitol, such as dietetic foods and sugar free candies and gums  . The most common symptoms of gas are belching, flatulence, bloating, and abdominal pain. However, some of these symptoms are often caused by an intestinal disorder, such as irritable bowel syndrome, rather than too much gas.  . The most common ways to reduce the discomfort of gas are changing diet, taking nonprescription medicines, and reducing the amount of air swallowed.  . Digestive enzymes, such as lactase supplements, actually help digest carbohydrates and may allow people to eat foods that normally cause gas.

## 2016-06-03 NOTE — Progress Notes (Signed)
CC'ED TO PCP 

## 2016-06-12 ENCOUNTER — Encounter (HOSPITAL_COMMUNITY): Payer: Self-pay | Admitting: Hematology & Oncology

## 2016-06-13 ENCOUNTER — Ambulatory Visit: Payer: Medicare Other | Admitting: "Endocrinology

## 2016-06-13 ENCOUNTER — Other Ambulatory Visit: Payer: Self-pay | Admitting: "Endocrinology

## 2016-06-14 ENCOUNTER — Other Ambulatory Visit: Payer: Self-pay | Admitting: "Endocrinology

## 2016-06-14 LAB — COMPREHENSIVE METABOLIC PANEL
ALT: 16 U/L (ref 6–29)
AST: 24 U/L (ref 10–35)
Albumin: 3.6 g/dL (ref 3.6–5.1)
Alkaline Phosphatase: 81 U/L (ref 33–130)
BUN: 19 mg/dL (ref 7–25)
CHLORIDE: 105 mmol/L (ref 98–110)
CO2: 30 mmol/L (ref 20–31)
CREATININE: 0.57 mg/dL — AB (ref 0.60–0.93)
Calcium: 9.8 mg/dL (ref 8.6–10.4)
GLUCOSE: 90 mg/dL (ref 65–99)
Potassium: 3.9 mmol/L (ref 3.5–5.3)
SODIUM: 140 mmol/L (ref 135–146)
Total Bilirubin: 0.3 mg/dL (ref 0.2–1.2)
Total Protein: 6.7 g/dL (ref 6.1–8.1)

## 2016-06-14 LAB — HEMOGLOBIN A1C
Hgb A1c MFr Bld: 5.8 % — ABNORMAL HIGH (ref <5.7)
Mean Plasma Glucose: 120 mg/dL

## 2016-06-14 LAB — LIPID PANEL
CHOL/HDL RATIO: 1.9 ratio (ref <5.0)
Cholesterol: 175 mg/dL (ref <200)
HDL: 92 mg/dL (ref >50)
LDL Cholesterol: 62 mg/dL (ref <100)
TRIGLYCERIDES: 104 mg/dL (ref <150)
VLDL: 21 mg/dL (ref <30)

## 2016-06-14 LAB — MICROALBUMIN / CREATININE URINE RATIO
CREATININE, URINE: 172 mg/dL (ref 20–320)
MICROALB/CREAT RATIO: 63 ug/mg{creat} — AB (ref <30)
Microalb, Ur: 10.8 mg/dL

## 2016-06-14 LAB — T4, FREE: Free T4: 1 ng/dL (ref 0.8–1.8)

## 2016-06-14 LAB — TSH: TSH: 0.16 mIU/L — ABNORMAL LOW

## 2016-06-21 ENCOUNTER — Encounter: Payer: Self-pay | Admitting: "Endocrinology

## 2016-06-21 ENCOUNTER — Ambulatory Visit (INDEPENDENT_AMBULATORY_CARE_PROVIDER_SITE_OTHER): Payer: Medicare Other | Admitting: "Endocrinology

## 2016-06-21 VITALS — BP 135/81 | HR 73 | Ht 59.0 in | Wt 112.0 lb

## 2016-06-21 DIAGNOSIS — E1165 Type 2 diabetes mellitus with hyperglycemia: Secondary | ICD-10-CM

## 2016-06-21 DIAGNOSIS — I10 Essential (primary) hypertension: Secondary | ICD-10-CM | POA: Diagnosis not present

## 2016-06-21 DIAGNOSIS — E118 Type 2 diabetes mellitus with unspecified complications: Secondary | ICD-10-CM

## 2016-06-21 DIAGNOSIS — IMO0002 Reserved for concepts with insufficient information to code with codable children: Secondary | ICD-10-CM

## 2016-06-21 NOTE — Progress Notes (Signed)
Subjective:    Patient ID: Tamara Todd, female    DOB: 1941-07-01. Patient is being seen in  F/u  for management of diabetes requested by  [name], NP  Past Medical History:  Diagnosis Date  . Crohn's disease ([place])    used to be followed by Dr. Algis Greenhouse. diagnosis in her 67s. multiple surgeries. remicade allergy.  . Diabetes ([place])   . GERD (gastroesophageal reflux disease)   . HTN (hypertension)   . Hyperlipidemia   . Hypertension   . Osteoporosis       . Pancreatitis    09/2015   Past Surgical History:  Procedure Laterality Date  . BACTERIAL OVERGROWTH TEST N/A 02/24/2016   POSITIVE for small bowel bacterial overgrowth   . BIOPSY  02/10/2016   Procedure: BIOPSY;  Surgeon: Danie Binder, MD;  [facility];  Service: Endoscopy;;  Random colon biopsies Gastric biopsies for h pylori gastric polyp biopsies  . BOWEL RESECTION     TWICE for stricture, including right colectomy with terminal ileal resection  . COLONOSCOPY    . COLONOSCOPY N/A 02/10/2016   Dr. Oneida Alar: normal neo terminal ileum, diverticula in sigmoid colon, non-bleeding internal hemorrhoids, adenomatous rectal polyp.   . ESOPHAGOGASTRODUODENOSCOPY N/A 02/10/2016   Dr. Oneida Alar: chronic gastritis, small hiatal hernia, few benign-appearing gastric polyps, no source for weight loss.   Marland Kitchen PARTIAL HYSTERECTOMY    . POLYPECTOMY  02/10/2016   Procedure: POLYPECTOMY;  Surgeon: Danie Binder, MD;  [facility];  Service: Endoscopy;;  Rectal polyps x 2 removed 1 by hot snare and number 2 via cold forceps  . RECTOVAGINAL FISTULA CLOSURE  2004   with temporary colostomy and subsequent take down, all at [facility]    Social History   Social History  . Marital status: Married    Spouse name: N/A  . Number of children: 1  . Years of education: N/A   Occupational History  . retired    Social History Main Topics  . Smoking status: Never Smoker  . Smokeless tobacco: Never Used  . Alcohol use No  .  Drug use: No  . Sexual activity: Not Asked   Other Topics Concern  . None   Social History Narrative  . None   Outpatient Encounter Prescriptions as of 06/21/2016  Medication Sig  . acetaminophen (TYLENOL) 650 MG CR tablet Take 1 tablet (650 mg total) by mouth every 8 (eight) hours as needed for pain.  Marland Kitchen amLODipine (NORVASC) 10 MG tablet Take 10 mg by mouth daily.  . calcium carbonate (OS-CAL - DOSED IN MG OF ELEMENTAL CALCIUM) 1250 (500 Ca) MG tablet Take 1 tablet by mouth 2 (two) times daily with a meal.  . cyanocobalamin (,VITAMIN B-12,) 1000 MCG/ML injection Inject 1 mL into the muscle every 14 (fourteen) days.  . magnesium chloride (SLOW-MAG) 64 MG TBEC SR tablet Take 1 tablet (64 mg total) by mouth 2 (two) times daily.  . metoprolol succinate (TOPROL-XL) 25 MG 24 hr tablet Take 25 mg by mouth daily.  Marland Kitchen omeprazole (PRILOSEC) 20 MG capsule Take 20 mg by mouth daily.  . phenol (CHLORASEPTIC) 1.4 % LIQD Use as directed 1 spray in the mouth or throat as needed for throat irritation / pain.  . potassium chloride SA (K-DUR,KLOR-CON) 20 MEQ tablet Take 1 tablet (20 mEq total) by mouth daily.  . Vitamin D, Ergocalciferol, (DRISDOL) 50000 units CAPS capsule Take 50,000 Units by mouth once a week. Takes on Mondays.  . [  DISCONTINUED] HYDROcodone-acetaminophen (NORCO) 5-325 MG tablet Take 1 tablet by mouth every 6 (six) hours as needed for moderate pain. (Patient not taking: Reported on 06/02/2016)   No facility-administered encounter medications on file as of 06/21/2016.    ALLERGIES: Allergies  Allergen Reactions  . Lisinopril     Cough   . Mercaptopurine     Neutropenia, pancreatitis  . Remicade [Infliximab] Other (See Comments)    Couldn't breath  . Codeine Rash   VACCINATION STATUS:  There is no immunization history on file for this patient.  Diabetes  She presents for her follow-up diabetic visit. She has type 2 diabetes mellitus. Onset time: She was diagnosed at approximate  age of 63 years . Her disease course has been stable. There are no hypoglycemic associated symptoms. Pertinent negatives for hypoglycemia include no confusion, headaches, pallor or seizures. There are no diabetic associated symptoms. Pertinent negatives for diabetes include no chest pain, no polydipsia, no polyphagia and no polyuria. There are no hypoglycemic complications. Symptoms are stable. There are no diabetic complications. Risk factors for coronary artery disease include diabetes mellitus and sedentary lifestyle. Current diabetic treatment includes oral agent (monotherapy). She is compliant with treatment most of the time. Her weight is increasing steadily. She is following a generally unhealthy diet. When asked about meal planning, she reported none. She has not had a previous visit with a dietitian. She rarely participates in exercise. An ACE inhibitor/angiotensin II receptor blocker is contraindicated (She is allergic to ACE inhibitor's.).       Review of Systems  Constitutional: Negative for chills, fever and unexpected weight change.  HENT: Negative for trouble swallowing and voice change.   Eyes: Negative for visual disturbance.  Respiratory: Negative for cough, shortness of breath and wheezing.   Cardiovascular: Negative for chest pain, palpitations and leg swelling.  Gastrointestinal: Negative for diarrhea, nausea and vomiting.  Endocrine: Negative for cold intolerance, heat intolerance, polydipsia, polyphagia and polyuria.  Musculoskeletal: Negative for arthralgias and myalgias.  Skin: Negative for color change, pallor, rash and wound.  Neurological: Negative for seizures and headaches.  Psychiatric/Behavioral: Negative for confusion and suicidal ideas.    Objective:    BP 135/81   Pulse 73   Ht 4' 11"  (1.499 m)   Wt 112 lb (50.8 kg)   BMI 22.62 kg/m   Wt Readings from Last 3 Encounters:  06/21/16 112 lb (50.8 kg)  06/02/16 110 lb 6.4 oz (50.1 kg)  04/27/16 106 lb 3.2  oz (48.2 kg)    Physical Exam  Constitutional: She is oriented to person, place, and time.  Light build.  HENT:  Head: Normocephalic and atraumatic.  Eyes: EOM are normal.  Neck: Normal range of motion. Neck supple. No tracheal deviation present. No thyromegaly present.  Cardiovascular: Normal rate and regular rhythm.   Pulmonary/Chest: Effort normal and breath sounds normal.  Abdominal: Soft. Bowel sounds are normal. There is no tenderness. There is no guarding.  Musculoskeletal: Normal range of motion. She exhibits no edema.  Neurological: She is alert and oriented to person, place, and time. She has normal reflexes. No cranial nerve deficit. Coordination normal.  Skin: Skin is warm and dry. No rash noted. No erythema. No pallor.  Psychiatric: She has a normal mood and affect. Judgment normal.     CMP ( most recent) CMP     Component Value Date/Time   NA 140 06/13/2016 1408   K 3.9 06/13/2016 1408   CL 105 06/13/2016 1408   CO2 30 06/13/2016  1408   GLUCOSE 90 06/13/2016 1408   BUN 19 06/13/2016 1408   CREATININE 0.57 (L) 06/13/2016 1408   CALCIUM 9.8 06/13/2016 1408   PROT 6.7 06/13/2016 1408   ALBUMIN 3.6 06/13/2016 1408   AST 24 06/13/2016 1408   ALT 16 06/13/2016 1408   ALKPHOS 81 06/13/2016 1408   BILITOT 0.3 06/13/2016 1408   GFRNONAA >60 04/05/2016 0645   GFRAA >60 04/05/2016 0645     Diabetic Labs (most recent): Lab Results  Component Value Date   HGBA1C 5.8 (H) 06/13/2016   HGBA1C 6.5 (H) 04/01/2016     Assessment & Plan:   1. Uncontrolled type 2 diabetes mellitus with complication, without long-term current use of insulin ([place])   - Patient has currently controlled symptomatic type 2 DM since  75 years of age,  with most recent A1c of  5.8% improving from 6.5 %. This is despite the fact that she was taken off of all vacations for diabetes.  Recent labs reviewed. She has gained 8 pounds since that her last visit with me, a good development for her. -I  discussed with the patient the need to avoid acute and chronic complications of diabetes which include CAD, CVA, CKD, retinopathy, and neuropathy.  - I have counseled the patient on diet management  by adopting a carbohydrate restricted/protein rich diet.  - I encouraged the patient to switch to  unprocessed or minimally processed complex starch and increased protein intake (animal or plant source), fruits, and vegetables.  - Patient is advised to stick to a routine mealtimes to eat 3 meals  a day and  some snacks if necessary.  - The patient will be scheduled with Jearld Fenton, RDN, CDE for individualized DM education.  - I have approached patient with the following individualized plan to manage diabetes and patient agrees:     - At this time, anti-diabetes therapies unnecessary. - If therapy is necessary in her case, insulin would be a better choice for her.  - She will return in 6 month with repeat labs. - Patient is not a good candidate for incretin therapy, metformin, nor SGLT2 inhibitors.  - Patient specific target  A1c;  LDL, HDL, Triglycerides, and  Waist Circumference were discussed in detail.  2) BP/HTN: Controlled. she reports allergies to ACE inhibitors. 3) Lipids/HPL:controlled.  4)  Weight/Diet:  she is light build. She is advised to increase her intake of more complex carbohydrates including rice, past, and bread.  5) vitamin D deficiency: I advised her to continue vitamin D 50,000 units weekly for the next 12 weeks.  6) Chronic Care/Health Maintenance:  -Patient is not on ACEI/ARB and Statin medications and encouraged to continue to follow up with Ophthalmology, Podiatrist at least yearly or according to recommendations, and advised to   stay away from smoking. I have recommended yearly flu vaccine and pneumonia vaccination at least every 5 years; moderate intensity exercise for up to 150 minutes weekly; and  sleep for at least 7 hours a day.  - 25 minutes of time was  spent on the care of this patient , 50% of which was applied for counseling on diabetes complications and their preventions.  - Patient to bring meter and  blood glucose logs during their next visit.   - I advised patient to maintain close follow up with [name], NP for primary care needs.  Follow up plan: - Return in about 6 months (around 12/20/2016) for follow up with pre-visit labs.  Glade Lloyd,  MD Phone: [phone]  Fax: [phone]   06/21/2016, 2:47 PM

## 2016-07-19 NOTE — Progress Notes (Signed)
Patient has been seen inpatient and outpatient since this MRCP. Plans changed.

## 2016-08-05 ENCOUNTER — Telehealth: Payer: Self-pay | Admitting: Gastroenterology

## 2016-08-05 NOTE — Telephone Encounter (Signed)
Pneumovax 23 given 06/15/11 Prevnar (Pneumococcal 13) 10/30/13

## 2016-08-18 ENCOUNTER — Encounter (HOSPITAL_COMMUNITY): Payer: Medicare Other | Attending: Adult Health | Admitting: Adult Health

## 2016-08-18 ENCOUNTER — Encounter (HOSPITAL_COMMUNITY): Payer: Self-pay | Admitting: Adult Health

## 2016-08-18 ENCOUNTER — Ambulatory Visit (HOSPITAL_COMMUNITY): Payer: Medicare Other | Admitting: Hematology & Oncology

## 2016-08-18 ENCOUNTER — Other Ambulatory Visit (HOSPITAL_COMMUNITY): Payer: Medicare Other

## 2016-08-18 VITALS — BP 132/68 | HR 70 | Temp 98.2°F | Resp 20 | Wt 110.9 lb

## 2016-08-18 DIAGNOSIS — R319 Hematuria, unspecified: Secondary | ICD-10-CM

## 2016-08-18 DIAGNOSIS — D509 Iron deficiency anemia, unspecified: Secondary | ICD-10-CM | POA: Diagnosis not present

## 2016-08-18 DIAGNOSIS — K5000 Crohn's disease of small intestine without complications: Secondary | ICD-10-CM | POA: Diagnosis not present

## 2016-08-18 DIAGNOSIS — D649 Anemia, unspecified: Secondary | ICD-10-CM | POA: Diagnosis not present

## 2016-08-18 LAB — IRON AND TIBC
IRON: 85 ug/dL (ref 28–170)
Saturation Ratios: 20 % (ref 10.4–31.8)
TIBC: 433 ug/dL (ref 250–450)
UIBC: 348 ug/dL

## 2016-08-18 LAB — VITAMIN B12: VITAMIN B 12: 809 pg/mL (ref 180–914)

## 2016-08-18 LAB — FERRITIN: Ferritin: 64 ng/mL (ref 11–307)

## 2016-08-18 LAB — FOLATE: FOLATE: 27.8 ng/mL (ref >5.9)

## 2016-08-18 NOTE — Progress Notes (Signed)
Tamara Todd presented for labwork. Labs per MD order drawn via Peripheral Line 23 gauge needle inserted in lt ac Good blood return present. Procedure without incident.  Needle removed intact. Patient tolerated procedure well.  Per Elzie Rings patient did not need a CBC diff today. Iron studies drawn.

## 2016-08-18 NOTE — Pre-Procedure Instructions (Signed)
Labs from PCP-08/16/16

## 2016-08-18 NOTE — Patient Instructions (Signed)
[place] at [facility] Discharge Instructions  RECOMMENDATIONS MADE BY THE CONSULTANT AND ANY TEST RESULTS WILL BE SENT TO YOUR REFERRING PHYSICIAN.  Go downstairs for labs  If your labs are good you will not have to come back to see [place].   We will call you with your lab results.   Thank you for choosing [place] at [facility] to provide your oncology and hematology care.  To afford each patient quality time with our provider, please arrive at least 15 minutes before your scheduled appointment time.    If you have a lab appointment with the [facility] please come in thru the  Main Entrance and check in at the main information desk  You need to re-schedule your appointment should you arrive 10 or more minutes late.  We strive to give you quality time with our providers, and arriving late affects you and other patients whose appointments are after yours.  Also, if you no show three or more times for appointments you may be dismissed from the clinic at the providers discretion.     Again, thank you for choosing [facility].  Our hope is that these requests will decrease the amount of time that you wait before being seen by our physicians.       _____________________________________________________________  Should you have questions after your visit to [facility], please contact our office at [phone] between the hours of 8:30 a.m. and 4:30 p.m.  Voicemails left after 4:30 p.m. will not be returned until the following business day.  For prescription refill requests, have your pharmacy contact our office.       Resources For Cancer Patients and their Caregivers ? American Cancer Society: Can assist with transportation, wigs, general needs, runs Look Good Feel Better.        614-051-2299 ? Cancer Care: Provides financial assistance, online support groups, medication/co-pay assistance.  1-800-813-HOPE  640-006-5020) ? [place] Assists [place] cancer patients and their families through emotional , educational and financial support.  602-521-1520 ? [place] DSS Where to apply for food stamps, Medicaid and utility assistance. 863-660-0541 ? [employer]: Transportation to medical appointments. (279)835-6134 ? Social Security Administration: May apply for disability if have a Stage IV cancer. 574-654-7142 475 857 8636 ? [employer], Disability and Transit Services: Assists with nutrition, care and transit needs. [place] Support Programs: @10RELATIVEDAYS @ > Cancer Support Group  2nd Tuesday of the month 1pm-2pm, Journey Room  > Creative Journey  3rd Tuesday of the month 1130am-1pm, Journey Room  > Look Good Feel Better  1st Wednesday of the month 10am-12 noon, Journey Room (Call [place] to register [phone])

## 2016-08-18 NOTE — Progress Notes (Signed)
Tamara Todd, [place] [address]   CLINIC:  Medical Oncology/Hematology  PCP:  [name], NP [address] [place] [address] [phone]   REASON FOR VISIT:  Follow-up for anemia in the setting of previous Crohn's disease treatment   CURRENT THERAPY: Observation    HISTORY OF PRESENT ILLNESS:  (From Dr. Donald Pore last note dated 05/18/16)    INTERVAL HISTORY:  Tamara Todd presents today for continued follow-up for anemia, likely secondary to 6-MP given to treat her Crohn's disease.   Overall, she reports feeling quite well. Denies any fatigue, bleeding in her stool/dark tarry stools, or frank bleeding from her nose or gums.  Her last colonoscopy was last summer in June/July 2017.  She follows closely with her GI physician, Dr. Oneida Alar, and will see her again in 09/2016.    Tamara Todd brought with her today recent lab results from her PCP, collected on 08/16/16. I reviewed these lab results in detail (results noted below).  Her hemoglobin is normal at 12.5; MCV mildly elevated at 102.6, RDW normal at 13.2.  Platelets are normal at 146,000.    She has been off of the 6-MP therapy for several months now and her blood counts continue to improve. She is not taking any oral iron supplementation.    A urinalysis was done with her PCP as well and there was noted to be hematuria. Denies dysuria. Her PCP has referred her to a urology specialist for further evaluation.   She had her mammogram in 06/2016 of her left breast to follow-up on a previous finding; she will be due for annual bilateral breast mammogram in 11/2016.     REVIEW OF SYSTEMS:  Review of Systems  Constitutional: Negative.  Negative for chills, fatigue and fever.  HENT:  Negative.   Eyes: Negative.   Respiratory: Negative.  Negative for cough and shortness of breath.   Cardiovascular: Negative.  Negative for chest pain and palpitations.  Gastrointestinal: Negative.  Negative for  blood in stool, constipation, diarrhea, nausea and vomiting.  Endocrine: Negative.   Genitourinary: Positive for hematuria (noted on recent UA with PCP ). Negative for dysuria.   Musculoskeletal: Negative.   Skin: Negative.   Neurological: Negative.  Negative for dizziness and headaches.  Hematological: Negative.  Negative for adenopathy. Does not bruise/bleed easily.  Psychiatric/Behavioral: Negative.      PAST MEDICAL/SURGICAL HISTORY:  Past Medical History:  Diagnosis Date  . Crohn's disease ([place])    used to be followed by Dr. Algis Greenhouse. diagnosis in her 26s. multiple surgeries. remicade allergy.  . Diabetes ([place])   . GERD (gastroesophageal reflux disease)   . HTN (hypertension)   . Hyperlipidemia   . Hypertension   . Osteoporosis       . Pancreatitis    09/2015   Past Surgical History:  Procedure Laterality Date  . BACTERIAL OVERGROWTH TEST N/A 02/24/2016   POSITIVE for small bowel bacterial overgrowth   . BIOPSY  02/10/2016   Procedure: BIOPSY;  Surgeon: Danie Binder, MD;  [facility];  Service: Endoscopy;;  Random colon biopsies Gastric biopsies for h pylori gastric polyp biopsies  . BOWEL RESECTION     TWICE for stricture, including right colectomy with terminal ileal resection  . COLONOSCOPY    . COLONOSCOPY N/A 02/10/2016   Dr. Oneida Alar: normal neo terminal ileum, diverticula in sigmoid colon, non-bleeding internal hemorrhoids, adenomatous rectal polyp.   . ESOPHAGOGASTRODUODENOSCOPY N/A 02/10/2016   Dr. Oneida Alar: chronic  gastritis, small hiatal hernia, few benign-appearing gastric polyps, no source for weight loss.   Marland Kitchen PARTIAL HYSTERECTOMY    . POLYPECTOMY  02/10/2016   Procedure: POLYPECTOMY;  Surgeon: Danie Binder, MD;  [facility];  Service: Endoscopy;;  Rectal polyps x 2 removed 1 by hot snare and number 2 via cold forceps  . RECTOVAGINAL FISTULA CLOSURE  2004   with temporary colostomy and subsequent take down, all at [facility].      SOCIAL  HISTORY:  Social History   Social History  . Marital status: Married    Spouse name: N/A  . Number of children: 1  . Years of education: N/A   Occupational History  . retired    Social History Main Topics  . Smoking status: Never Smoker  . Smokeless tobacco: Never Used  . Alcohol use No  . Drug use: No  . Sexual activity: Not on file   Other Topics Concern  . Not on file   Social History Narrative  . No narrative on file    FAMILY HISTORY:  Family History  Problem Relation Age of Onset  . Colon cancer Father     33s  . Inflammatory bowel disease Sister     ???  . Colon cancer Brother     3    CURRENT MEDICATIONS:  Outpatient Encounter Prescriptions as of 08/18/2016  Medication Sig  . acetaminophen (TYLENOL) 650 MG CR tablet Take 1 tablet (650 mg total) by mouth every 8 (eight) hours as needed for pain.  Marland Kitchen amLODipine (NORVASC) 10 MG tablet Take 10 mg by mouth daily.  . calcium carbonate (OS-CAL - DOSED IN MG OF ELEMENTAL CALCIUM) 1250 (500 Ca) MG tablet Take 1 tablet by mouth 2 (two) times daily with a meal.  . cyanocobalamin (,VITAMIN B-12,) 1000 MCG/ML injection Inject 1 mL into the muscle every 14 (fourteen) days.  . magnesium chloride (SLOW-MAG) 64 MG TBEC SR tablet Take 1 tablet (64 mg total) by mouth 2 (two) times daily.  . metoprolol succinate (TOPROL-XL) 25 MG 24 hr tablet Take 25 mg by mouth daily.  Marland Kitchen omeprazole (PRILOSEC) 20 MG capsule Take 20 mg by mouth daily.  . phenol (CHLORASEPTIC) 1.4 % LIQD Use as directed 1 spray in the mouth or throat as needed for throat irritation / pain.  . potassium chloride SA (K-DUR,KLOR-CON) 20 MEQ tablet Take 1 tablet (20 mEq total) by mouth daily.  . Vitamin D, Ergocalciferol, (DRISDOL) 50000 units CAPS capsule Take 50,000 Units by mouth once a week. Takes on Mondays.   No facility-administered encounter medications on file as of 08/18/2016.     ALLERGIES:  Allergies  Allergen Reactions  . Lisinopril     Cough   .  Mercaptopurine     Neutropenia, pancreatitis  . Remicade [Infliximab] Other (See Comments)    Couldn't breath  . Codeine Rash     PHYSICAL EXAM:  ECOG Performance status: 0  Physical Exam  Constitutional: She is oriented to person, place, and time and well-developed, well-nourished, and in no distress.  HENT:  Head: Normocephalic.  Mouth/Throat: Oropharynx is clear and moist. No oropharyngeal exudate.  Eyes: Conjunctivae are normal. Pupils are equal, round, and reactive to light. No scleral icterus.  Neck: Normal range of motion. Neck supple.  Cardiovascular: Normal rate, regular rhythm and normal heart sounds.   Pulmonary/Chest: Effort normal and breath sounds normal. No respiratory distress.  Abdominal: Soft. Bowel sounds are normal. There is no tenderness.  Lymphadenopathy:  She has no cervical adenopathy.    She has no axillary adenopathy.       Right: No supraclavicular adenopathy present.       Left: No supraclavicular adenopathy present.  Neurological: She is alert and oriented to person, place, and time. Gait normal.  Skin: Skin is warm and dry.  Psychiatric: Mood, memory, affect and judgment normal.     LABORATORY DATA:  I have reviewed the labs as listed.  Lab results from PCP office on 08/16/16           DIAGNOSTIC IMAGING:    PATHOLOGY:     ASSESSMENT & PLAN:  Ms. Ellingwood is a very pleasant 76 y.o. female with history of iron deficiency anemia in the setting of Crohn's disease and Mercaptopurine (6-MP) therapy.  She returns for routine follow-up for her anemia.    Anemia: -Based on the labs collected at her PCP's office, her hemoglobin has improved even further and is now normal at 12.5.  We will collect an anemia panel today (ferritin, iron/TIBC, folate, & vitamin B12). According to Dr. Donald Pore last note, if these studies are normal, then she can continue to be followed by her PCP and would not need subsequent follow-up at the cancer center. We  will notify her of these lab results when they become available.   Painless hematuria: -Reinforced the recommendation from her PCP to see a urology specialist for painless hematuria.  She understands the importance and will work on making this appointment for evaluation.      Dispo:  -Anemia panel pending today; if panel is normal then she can follow-up with her PCP and will not need further follow-up at cancer center.   -If anemia panel requires action, we will notify the patient and arrange appropriate therapy and follow-up, as clinically indicated.    All questions were answered to patient's stated satisfaction. Encouraged patient to call with any new concerns or questions before her next visit to the cancer center, if needed, and we can certain see her sooner, if needed.        Mike Craze, NP [place] [phone]

## 2016-08-25 ENCOUNTER — Encounter: Payer: Self-pay | Admitting: Gastroenterology

## 2016-10-12 ENCOUNTER — Other Ambulatory Visit: Payer: Self-pay

## 2016-10-12 ENCOUNTER — Encounter: Payer: Self-pay | Admitting: Gastroenterology

## 2016-10-12 ENCOUNTER — Other Ambulatory Visit: Payer: Self-pay | Admitting: Gastroenterology

## 2016-10-12 ENCOUNTER — Ambulatory Visit (INDEPENDENT_AMBULATORY_CARE_PROVIDER_SITE_OTHER): Payer: Medicare Other | Admitting: Gastroenterology

## 2016-10-12 DIAGNOSIS — M818 Other osteoporosis without current pathological fracture: Secondary | ICD-10-CM

## 2016-10-12 DIAGNOSIS — K5080 Crohn's disease of both small and large intestine without complications: Secondary | ICD-10-CM | POA: Diagnosis not present

## 2016-10-12 DIAGNOSIS — M810 Age-related osteoporosis without current pathological fracture: Secondary | ICD-10-CM | POA: Insufficient documentation

## 2016-10-12 DIAGNOSIS — E559 Vitamin D deficiency, unspecified: Secondary | ICD-10-CM

## 2016-10-12 DIAGNOSIS — D500 Iron deficiency anemia secondary to blood loss (chronic): Secondary | ICD-10-CM | POA: Diagnosis not present

## 2016-10-12 LAB — CBC WITH DIFFERENTIAL/PLATELET
BASOS ABS: 44 {cells}/uL (ref 0–200)
Basophils Relative: 1 %
EOS ABS: 132 {cells}/uL (ref 15–500)
Eosinophils Relative: 3 %
HEMATOCRIT: 36.5 % (ref 35.0–45.0)
HEMOGLOBIN: 11.5 g/dL — AB (ref 11.7–15.5)
LYMPHS ABS: 1584 {cells}/uL (ref 850–3900)
LYMPHS PCT: 36 %
MCH: 31.2 pg (ref 27.0–33.0)
MCHC: 31.5 g/dL — AB (ref 32.0–36.0)
MCV: 98.9 fL (ref 80.0–100.0)
MONO ABS: 352 {cells}/uL (ref 200–950)
MPV: 9.8 fL (ref 7.5–12.5)
Monocytes Relative: 8 %
NEUTROS PCT: 52 %
Neutro Abs: 2288 cells/uL (ref 1500–7800)
Platelets: 178 10*3/uL (ref 140–400)
RBC: 3.69 MIL/uL — ABNORMAL LOW (ref 3.80–5.10)
RDW: 14.7 % (ref 11.0–15.0)
WBC: 4.4 10*3/uL (ref 3.8–10.8)

## 2016-10-12 NOTE — Assessment & Plan Note (Signed)
LAST TCS JUL 2017-NL COLON/RECTUM. SYMPTOMS FAIRLY WELL CONTROLLED OFF MEDS.  TCS Q1-2 YEARS FOR SURVEILLANCE. CALL WITH QUESTIONS OR CONCERNS. If FLARES WILL NEED PREDNISONE FOR 3-4 WEEKS AND THEN WILL START HUMIRA. DRINK WATER TO KEEP YOUR URINE LIGHT YELLOW. FOLLOW A HIGH FIBER DIET. AVOID ITEMS THAT CAUSE BLOATING & GAS.   FOLLOW UP IN 6 MOS.

## 2016-10-12 NOTE — Progress Notes (Signed)
ON RECALL  °

## 2016-10-12 NOTE — Assessment & Plan Note (Signed)
ON PO VIT D.  NEEDS RECHECK LEVEL WITHIN 6 MOS.

## 2016-10-12 NOTE — Assessment & Plan Note (Signed)
NO BRBPR OR MELENA BUT LAST CBC SHOWED LOW PLTS.  NEED REPEAT CBC.

## 2016-10-12 NOTE — Assessment & Plan Note (Signed)
LAST DEXA 2015. ON CALCIUM AND VITAMIN D.  RECHECK VIT D LEVEL.

## 2016-10-12 NOTE — Patient Instructions (Addendum)
COMPLETE DEXA SCAN AND SEE ENDOCRINOLOGY(DR. NIDA).   COMPLETE BLOOD DRAW. I AM CHECKING YOUR VITAMIN D LEVEL AND YOUR BLOOD COUNT.  Please CALL WITH QUESTIONS OR CONCERNS.  If you have a flare YOU WILL NEED PREDNISONE FOR 3-4 WEEKS AND THEN WE WILL START HUMIRA. I WILL SUBMIT PAPERWORK TODAY.  DRINK WATER TO KEEP YOUR URINE LIGHT YELLOW.  FOLLOW A HIGH FIBER DIET. AVOID ITEMS THAT CAUSE BLOATING & GAS.   FOLLOW UP IN 6 MOS.   High-Fiber Diet A high-fiber diet changes your normal diet to include more whole grains, legumes, fruits, and vegetables. Changes in the diet involve replacing refined carbohydrates with unrefined foods. The calorie level of the diet is essentially unchanged. The Dietary Reference Intake (recommended amount) for adult males is 38 grams per day. For adult females, it is 25 grams per day. Pregnant and lactating women should consume 28 grams of fiber per day. Fiber is the intact part of a plant that is not broken down during digestion. Functional fiber is fiber that has been isolated from the plant to provide a beneficial effect in the body. PURPOSE  Increase stool bulk.   Ease and regulate bowel movements.   Lower cholesterol.   REDUCE RISK OF COLON CANCER  INDICATIONS THAT YOU NEED MORE FIBER  Constipation and hemorrhoids.   Uncomplicated diverticulosis (intestine condition) and irritable bowel syndrome.   Weight management.   As a protective measure against hardening of the arteries (atherosclerosis), diabetes, and cancer.   GUIDELINES FOR INCREASING FIBER IN THE DIET  Start adding fiber to the diet slowly. A gradual increase of about 5 more grams (2 slices of whole-wheat bread, 2 servings of most fruits or vegetables, or 1 bowl of high-fiber cereal) per day is best. Too rapid an increase in fiber may result in constipation, flatulence, and bloating.   Drink enough water and fluids to keep your urine clear or pale yellow. Water, juice, or caffeine-free  drinks are recommended. Not drinking enough fluid may cause constipation.   Eat a variety of high-fiber foods rather than one type of fiber.   Try to increase your intake of fiber through using high-fiber foods rather than fiber pills or supplements that contain small amounts of fiber.   The goal is to change the types of food eaten. Do not supplement your present diet with high-fiber foods, but replace foods in your present diet.   INCLUDE A VARIETY OF FIBER SOURCES  Replace refined and processed grains with whole grains, canned fruits with fresh fruits, and incorporate other fiber sources. White rice, white breads, and most bakery goods contain little or no fiber.   Brown whole-grain rice, buckwheat oats, and many fruits and vegetables are all good sources of fiber. These include: broccoli, Brussels sprouts, cabbage, cauliflower, beets, sweet potatoes, white potatoes (skin on), carrots, tomatoes, eggplant, squash, berries, fresh fruits, and dried fruits.   Cereals appear to be the richest source of fiber. Cereal fiber is found in whole grains and bran. Bran is the fiber-rich outer coat of cereal grain, which is largely removed in refining. In whole-grain cereals, the bran remains. In breakfast cereals, the largest amount of fiber is found in those with "bran" in their names. The fiber content is sometimes indicated on the label.   You may need to include additional fruits and vegetables each day.   In baking, for 1 cup white flour, you may use the following substitutions:   1 cup whole-wheat flour minus 2 tablespoons.  1/2 cup white flour plus 1/2 cup whole-wheat flour.

## 2016-10-12 NOTE — Progress Notes (Signed)
Subjective:    Patient ID: Tamara Todd, female    DOB: 05-Apr-1941, 76 y.o.   MRN: [id number]  [name], NP  HPI HAVING TROUBLE WITH BACK AND BLADDER. SEEING UROLOGY IN [place], [place] BLOOD IN URINE. PLACE ON KEFLEX. CT MON PENDING. OFF MEDS FOR CROHN'S DISEASE MEDS SINCE SEP 2017.  BMS: 1-2X/DAY. HAVING A #4 AND #7(1X/WEEK). HAD THE FLU-FEB 2018. JUST GETTING BACK TO HERSELF.  PT DENIES FEVER, CHILLS, HEMATOCHEZIA, HEMATEMESIS, nausea, vomiting, melena, CHEST PAIN, SHORTNESS OF BREATH, CHANGE IN BOWEL IN HABITS, constipation, abdominal pain, problems swallowing, OR heartburn or indigestion.  Past Medical History:  Diagnosis Date  . Crohn's disease ([place])    used to be followed by Dr. Algis Greenhouse. diagnosis in her 56s. multiple surgeries. remicade allergy.  . Diabetes ([place])   . GERD (gastroesophageal reflux disease)   . HTN (hypertension)   . Hyperlipidemia   . Hypertension   . Osteoporosis       . Pancreatitis    09/2015   Past Surgical History:  Procedure Laterality Date  . BACTERIAL OVERGROWTH TEST N/A 02/24/2016   POSITIVE for small bowel bacterial overgrowth   . BIOPSY  02/10/2016   Procedure: BIOPSY;  Surgeon: Danie Binder, MD;  [facility];  Service: Endoscopy;;  Random colon biopsies Gastric biopsies for h pylori gastric polyp biopsies  . BOWEL RESECTION     TWICE for stricture, including right colectomy with terminal ileal resection  . COLONOSCOPY    . COLONOSCOPY N/A 02/10/2016   Dr. Oneida Alar: normal neo terminal ileum, diverticula in sigmoid colon, non-bleeding internal hemorrhoids, adenomatous rectal polyp.   . ESOPHAGOGASTRODUODENOSCOPY N/A 02/10/2016   Dr. Oneida Alar: chronic gastritis, small hiatal hernia, few benign-appearing gastric polyps, no source for weight loss.   Marland Kitchen PARTIAL HYSTERECTOMY    . POLYPECTOMY  02/10/2016   Procedure: POLYPECTOMY;  Surgeon: Danie Binder, MD;  [facility];  Service: Endoscopy;;  Rectal polyps x 2 removed  1 by hot snare and number 2 via cold forceps  . RECTOVAGINAL FISTULA CLOSURE  2004   with temporary colostomy and subsequent take down, all at [facility].    Allergies  Allergen Reactions  . Lisinopril     Cough   . Mercaptopurine     Neutropenia, pancreatitis  . Remicade [Infliximab] Other (See Comments)    Couldn't breath  . Codeine Rash   Current Outpatient Prescriptions  Medication Sig Dispense Refill  . acetaminophen (TYLENOL) 650 MG CR tablet Take 1 tablet (650 mg total) by mouth every 8 (eight) hours as needed for pain.    Marland Kitchen amLODipine (NORVASC) 10 MG tablet Take 10 mg by mouth daily.    . calcium carbonate (OS-CAL - DOSED IN MG OF ELEMENTAL CALCIUM) 1250 (500 Ca) MG tablet Take 1 tablet by mouth 2 (two) times daily with a meal.    . cyanocobalamin (,VITAMIN B-12,) 1000 MCG/ML injection Inject 1 mL into the muscle every 14 (fourteen) days.    . magnesium chloride (SLOW-MAG) 64 MG TBEC SR tablet Take 1 tablet ONE DAILY    . metoprolol succinate 25 MG 24 hr tablet Take 25 mg by mouth daily.    Marland Kitchen omeprazole (PRILOSEC) 20 MG capsule Take 20 mg by mouth daily.    Marland Kitchen [place] FOR UTI          . Vitamin D, Ergocalciferol, (DRISDOL) 50000 units CAPS capsule Take 50,000 Units by mouth once a week. Takes on Mondays.  Review of Systems PER HPI OTHERWISE ALL SYSTEMS ARE NEGATIVE.    Objective:   Physical Exam  Constitutional: She is oriented to person, place, and time. She appears well-developed and well-nourished. No distress.  HENT:  Head: Normocephalic and atraumatic.  Mouth/Throat: Oropharynx is clear and moist. No oropharyngeal exudate.  Eyes: Pupils are equal, round, and reactive to light. No scleral icterus.  Neck: Normal range of motion. Neck supple.  Cardiovascular: Normal rate, regular rhythm and normal heart sounds.   Pulmonary/Chest: Effort normal and breath sounds normal. No respiratory distress.  Abdominal: Soft. Bowel sounds are normal. She exhibits no distension. There  is tenderness. There is no rebound and no guarding.  MILD BLQs TTP   Musculoskeletal: She exhibits no edema.  Lymphadenopathy:    She has no cervical adenopathy.  Neurological: She is alert and oriented to person, place, and time.  NO FOCAL DEFICITS  Psychiatric: She has a normal mood and affect.  Vitals reviewed.     Assessment & Plan:

## 2016-10-12 NOTE — Progress Notes (Signed)
CC'ED TO PCP 

## 2016-10-13 LAB — VITAMIN D 25 HYDROXY (VIT D DEFICIENCY, FRACTURES): Vit D, 25-Hydroxy: 25 ng/mL — ABNORMAL LOW (ref 30–100)

## 2016-10-20 ENCOUNTER — Other Ambulatory Visit (HOSPITAL_COMMUNITY): Payer: Medicare Other

## 2016-10-26 ENCOUNTER — Telehealth: Payer: Self-pay | Admitting: Gastroenterology

## 2016-10-26 NOTE — Telephone Encounter (Signed)
PLEASE CALL PT. HER VITAMIN D LEVEL IS LOW. SHE SHOULD TAKE HER VIT D. SHE SHOULD DISCUSS IT WITH DR.NIDA IN JUN 2018. HER BLOOD COUNT IS NEAR NORMAL AND STABLE SINCE NOV 2017.

## 2016-10-27 ENCOUNTER — Telehealth: Payer: Self-pay | Admitting: Gastroenterology

## 2016-10-27 ENCOUNTER — Ambulatory Visit (HOSPITAL_COMMUNITY)
Admission: RE | Admit: 2016-10-27 | Discharge: 2016-10-27 | Disposition: A | Discharge status: Home / Self Care | Payer: Medicare Other | Source: Ambulatory Visit | Attending: Gastroenterology | Admitting: Gastroenterology

## 2016-10-27 DIAGNOSIS — Z780 Asymptomatic menopausal state: Secondary | ICD-10-CM | POA: Insufficient documentation

## 2016-10-27 DIAGNOSIS — M810 Age-related osteoporosis without current pathological fracture: Secondary | ICD-10-CM | POA: Diagnosis not present

## 2016-10-27 DIAGNOSIS — K5090 Crohn's disease, unspecified, without complications: Secondary | ICD-10-CM | POA: Diagnosis not present

## 2016-10-27 DIAGNOSIS — M818 Other osteoporosis without current pathological fracture: Secondary | ICD-10-CM

## 2016-10-27 NOTE — Telephone Encounter (Signed)
PLEASE CALL PT. HER DEXA SCAN SHOWS OSTEOPROSIS. IS IS IMPORTANT FOR HER TO SEE DR.NIDA IN JUN 2018 SO HE CAN MAKE ADDITIONAL RECOMMENDATIONS REGARDING MANAGING HER BONE LOSS. SHE SHOULD INCREASE HER OSCAL TO TID AFTER MEALS.

## 2016-10-27 NOTE — Telephone Encounter (Signed)
Pt called and is aware.

## 2016-10-27 NOTE — Telephone Encounter (Signed)
Left message for pt to call.

## 2016-10-31 ENCOUNTER — Telehealth: Payer: Self-pay

## 2016-10-31 NOTE — Telephone Encounter (Signed)
Pt called and said she is having a crohn's flare.  She is having watery diarrhea , 4-5 times yesterday, once during the night and once already this morning.  Lower abd pain on the right side, and feels sore all over. She is asking for pain med, but has taken Tylenol and it has her feeling some better. Please advise! Routing to Walden Field, NP who has seen pt, in the absence of Dr. Oneida Alar today.

## 2016-10-31 NOTE — Telephone Encounter (Signed)
PT is aware.

## 2016-11-02 NOTE — Telephone Encounter (Signed)
Called patient TO DISCUSS CONCERNS. HAVING A LITTLE BIT BETTER. WAS 7-8 TIMES A DAY AND WAKING UP IN THE NIGHT. WAS HAVING A LOT OF PAIN BUT NOW PAIN HAS GOTTEN BETTER. THOUGHT IT WAS A FLARE BUT CURRENTLY ALLERGIES ARE BAD. HAS A BAD COLD AS WELL. SYMPTOMS LAST FRI WHEN STARTED GETTING REAL SICK. HAVING SORENESS ALL OVER, BACK AND SIDES. MON WAS IN TERRIBLE SHAPE. TODAY HAD 4 STOOLS. ALSO HAS A COUGH(PRODUCTIVE). DRINKING WATER AND JUICE. NO ABX. WILL PROVIDE HYDROCODONE/ACETAMINOPHEN #15 [id number]. PROBABLE VIRAL ILLNESS. CLINICALLY IMPROVED. GO TO ED FOR WORSENING ABDOMINAL PAIN, NAUSEA/VOMITING.

## 2016-11-03 NOTE — Telephone Encounter (Signed)
PT is coming by after 12:00 noon today to pick up prescription.

## 2016-11-03 NOTE — Telephone Encounter (Signed)
PT RX #20 GIVEN NORCO 5/325

## 2016-12-21 ENCOUNTER — Ambulatory Visit: Payer: Medicare Other | Admitting: "Endocrinology

## 2016-12-23 ENCOUNTER — Other Ambulatory Visit: Payer: Self-pay | Admitting: "Endocrinology

## 2016-12-23 DIAGNOSIS — E118 Type 2 diabetes mellitus with unspecified complications: Principal | ICD-10-CM

## 2016-12-23 DIAGNOSIS — E1165 Type 2 diabetes mellitus with hyperglycemia: Secondary | ICD-10-CM

## 2016-12-24 LAB — COMPREHENSIVE METABOLIC PANEL
ALBUMIN: 3.5 g/dL — AB (ref 3.6–5.1)
ALT: 18 U/L (ref 6–29)
AST: 22 U/L (ref 10–35)
Alkaline Phosphatase: 99 U/L (ref 33–130)
BUN: 17 mg/dL (ref 7–25)
CHLORIDE: 105 mmol/L (ref 98–110)
CO2: 25 mmol/L (ref 20–31)
Calcium: 9.4 mg/dL (ref 8.6–10.4)
Creat: 0.52 mg/dL — ABNORMAL LOW (ref 0.60–0.93)
Glucose, Bld: 143 mg/dL — ABNORMAL HIGH (ref 65–99)
POTASSIUM: 4 mmol/L (ref 3.5–5.3)
Sodium: 142 mmol/L (ref 135–146)
TOTAL PROTEIN: 6.9 g/dL (ref 6.1–8.1)
Total Bilirubin: 0.3 mg/dL (ref 0.2–1.2)

## 2016-12-24 LAB — HEMOGLOBIN A1C
HEMOGLOBIN A1C: 5.5 % (ref <5.7)
MEAN PLASMA GLUCOSE: 111 mg/dL

## 2016-12-29 ENCOUNTER — Encounter: Payer: Self-pay | Admitting: "Endocrinology

## 2016-12-29 ENCOUNTER — Ambulatory Visit (INDEPENDENT_AMBULATORY_CARE_PROVIDER_SITE_OTHER): Payer: Medicare Other | Admitting: "Endocrinology

## 2016-12-29 VITALS — BP 132/73 | HR 73 | Ht 59.0 in | Wt 111.0 lb

## 2016-12-29 DIAGNOSIS — I10 Essential (primary) hypertension: Secondary | ICD-10-CM | POA: Diagnosis not present

## 2016-12-29 DIAGNOSIS — E119 Type 2 diabetes mellitus without complications: Secondary | ICD-10-CM

## 2016-12-29 NOTE — Progress Notes (Signed)
Subjective:    Patient ID: Tamara Todd, female    DOB: 1940-08-15. Patient is being seen in  F/u  for management of diabetes requested by  [name], NP  Past Medical History:  Diagnosis Date  . Crohn's disease ([place])    used to be followed by Dr. Algis Greenhouse. diagnosis in her 37s. multiple surgeries. remicade allergy.  . Diabetes ([place])   . GERD (gastroesophageal reflux disease)   . HTN (hypertension)   . Hyperlipidemia   . Hypertension   . Osteoporosis       . Pancreatitis    09/2015   Past Surgical History:  Procedure Laterality Date  . BACTERIAL OVERGROWTH TEST N/A 02/24/2016   POSITIVE for small bowel bacterial overgrowth   . BIOPSY  02/10/2016   Procedure: BIOPSY;  Surgeon: Danie Binder, MD;  [facility];  Service: Endoscopy;;  Random colon biopsies Gastric biopsies for h pylori gastric polyp biopsies  . BOWEL RESECTION     TWICE for stricture, including right colectomy with terminal ileal resection  . COLONOSCOPY    . COLONOSCOPY N/A 02/10/2016   Dr. Oneida Alar: normal neo terminal ileum, diverticula in sigmoid colon, non-bleeding internal hemorrhoids, adenomatous rectal polyp.   . ESOPHAGOGASTRODUODENOSCOPY N/A 02/10/2016   Dr. Oneida Alar: chronic gastritis, small hiatal hernia, few benign-appearing gastric polyps, no source for weight loss.   Marland Kitchen PARTIAL HYSTERECTOMY    . POLYPECTOMY  02/10/2016   Procedure: POLYPECTOMY;  Surgeon: Danie Binder, MD;  [facility];  Service: Endoscopy;;  Rectal polyps x 2 removed 1 by hot snare and number 2 via cold forceps  . 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    Social History   Social History  . Marital status: Married    Spouse name: N/A  . Number of children: 1  . Years of education: N/A   Occupational History  . retired    Social History Main Topics  . Smoking status: Never Smoker  . Smokeless tobacco: Never Used  . Alcohol use No  .  Drug use: No  . Sexual activity: Not Asked   Other Topics Concern  . None   Social History Narrative  . None   Outpatient Encounter Prescriptions as of 12/29/2016  Medication Sig  . acetaminophen (TYLENOL) 650 MG CR tablet Take 1 tablet (650 mg total) by mouth every 8 (eight) hours as needed for pain.  Marland Kitchen amLODipine (NORVASC) 10 MG tablet Take 10 mg by mouth daily.  . calcium carbonate (OS-CAL - DOSED IN MG OF ELEMENTAL CALCIUM) 1250 (500 Ca) MG tablet Take 1 tablet by mouth 2 (two) times daily with a meal.  . cyanocobalamin (,VITAMIN B-12,) 1000 MCG/ML injection Inject 1 mL into the muscle every 14 (fourteen) days.  . magnesium chloride (SLOW-MAG) 64 MG TBEC SR tablet Take 1 tablet (64 mg total) by mouth 2 (two) times daily.  . metoprolol succinate (TOPROL-XL) 25 MG 24 hr tablet Take 25 mg by mouth daily.  Marland Kitchen omeprazole (PRILOSEC) 20 MG capsule Take 20 mg by mouth daily.  . phenol (CHLORASEPTIC) 1.4 % LIQD Use as directed 1 spray in the mouth or throat as needed for throat irritation / pain.  . potassium chloride SA (K-DUR,KLOR-CON) 20 MEQ tablet Take 1 tablet (20 mEq total) by mouth daily.  . Vitamin D, Ergocalciferol, (DRISDOL) 50000 units CAPS capsule Take 50,000 Units by mouth once a week. Takes on Mondays.  No facility-administered encounter medications on file as of 12/29/2016.    ALLERGIES: Allergies  Allergen Reactions  . Lisinopril     Cough   . Mercaptopurine     Neutropenia, pancreatitis  . Remicade [Infliximab] Other (See Comments)    Couldn't breath  . Codeine Rash   VACCINATION STATUS:  There is no immunization history on file for this patient.  Diabetes  She presents for her follow-up diabetic visit. She has type 2 diabetes mellitus. Onset time: She was diagnosed at approximate age of 89 years . Her disease course has been stable. There are no hypoglycemic associated symptoms. Pertinent negatives for hypoglycemia include no confusion, headaches, pallor or  seizures. There are no diabetic associated symptoms. Pertinent negatives for diabetes include no chest pain, no polydipsia, no polyphagia and no polyuria. There are no hypoglycemic complications. Symptoms are stable. There are no diabetic complications. Risk factors for coronary artery disease include diabetes mellitus and sedentary lifestyle. Current diabetic treatment includes oral agent (monotherapy). She is compliant with treatment most of the time. Her weight is stable. She is following a generally unhealthy diet. When asked about meal planning, she reported none. She has not had a previous visit with a dietitian. She rarely participates in exercise. An ACE inhibitor/angiotensin II receptor blocker is contraindicated (She is allergic to ACE inhibitor's.).       Review of Systems  Constitutional: Negative for chills, fever and unexpected weight change.  HENT: Negative for trouble swallowing and voice change.   Eyes: Negative for visual disturbance.  Respiratory: Negative for cough, shortness of breath and wheezing.   Cardiovascular: Negative for chest pain, palpitations and leg swelling.  Gastrointestinal: Negative for diarrhea, nausea and vomiting.  Endocrine: Negative for cold intolerance, heat intolerance, polydipsia, polyphagia and polyuria.  Musculoskeletal: Negative for arthralgias and myalgias.  Skin: Negative for color change, pallor, rash and wound.  Neurological: Negative for seizures and headaches.  Psychiatric/Behavioral: Negative for confusion and suicidal ideas.    Objective:    BP 132/73   Pulse 73   Ht 4' 11"  (1.499 m)   Wt 111 lb (50.3 kg)   BMI 22.42 kg/m   Wt Readings from Last 3 Encounters:  12/29/16 111 lb (50.3 kg)  10/12/16 111 lb 6.4 oz (50.5 kg)  08/18/16 110 lb 14.4 oz (50.3 kg)    Physical Exam  Constitutional: She is oriented to person, place, and time.  Light build.  HENT:  Head: Normocephalic and atraumatic.  Eyes: EOM are normal.  Neck: Normal  range of motion. Neck supple. No tracheal deviation present. No thyromegaly present.  Cardiovascular: Normal rate and regular rhythm.   Pulmonary/Chest: Effort normal and breath sounds normal.  Abdominal: Soft. Bowel sounds are normal. There is no tenderness. There is no guarding.  Musculoskeletal: Normal range of motion. She exhibits no edema.  Neurological: She is alert and oriented to person, place, and time. She has normal reflexes. No cranial nerve deficit. Coordination normal.  Skin: Skin is warm and dry. No rash noted. No erythema. No pallor.  Psychiatric: She has a normal mood and affect. Judgment normal.     CMP ( most recent) CMP     Component Value Date/Time   NA 142 12/23/2016 1148   K 4.0 12/23/2016 1148   CL 105 12/23/2016 1148   CO2 25 12/23/2016 1148   GLUCOSE 143 (H) 12/23/2016 1148   BUN 17 12/23/2016 1148   CREATININE 0.52 (L) 12/23/2016 1148   CALCIUM 9.4 12/23/2016 1148  PROT 6.9 12/23/2016 1148   ALBUMIN 3.5 (L) 12/23/2016 1148   AST 22 12/23/2016 1148   ALT 18 12/23/2016 1148   ALKPHOS 99 12/23/2016 1148   BILITOT 0.3 12/23/2016 1148   GFRNONAA >60 04/05/2016 0645   GFRAA >60 04/05/2016 0645     Diabetic Labs (most recent): Lab Results  Component Value Date   HGBA1C 5.5 12/23/2016   HGBA1C 5.8 (H) 06/13/2016   HGBA1C 6.5 (H) 04/01/2016     Assessment & Plan:   1. Uncontrolled type 2 diabetes mellitus with complication, without long-term current use of insulin ([place])   - Patient has currently controlled symptomatic type 2 DM since  76 years of age,  with most recent A1c of  5.5% improving from 6.5 %. This is despite the fact that she was taken off of all vacations for diabetes.  Recent labs reviewed.  She has Steady weight. -I discussed with the patient the need to avoid acute and chronic complications of diabetes which include CAD, CVA, CKD, retinopathy, and neuropathy.  - I have counseled the patient on diet management  by adopting a  carbohydrate restricted/protein rich diet.  - I encouraged the patient to switch to  unprocessed or minimally processed complex starch and increased protein intake (animal or plant source), fruits, and vegetables.  - Patient is advised to stick to a routine mealtimes to eat 3 meals  a day and  some snacks if necessary.  - I have approached patient with the following individualized plan to manage diabetes and patient agrees:     - At this time, anti-diabetes therapies unnecessary. - If therapy is necessary in her case, insulin would be a better choice for her.  - She will return in 6 month with repeat labs. - Patient is not a good candidate for incretin therapy, metformin, nor SGLT2 inhibitors.  - Patient specific target  A1c;  LDL, HDL, Triglycerides, and  Waist Circumference were discussed in detail.  2) BP/HTN: Controlled. she reports allergies to ACE inhibitors. 3) Lipids/HPL:controlled.  4)  Weight/Diet:  she is light build. She is advised to increase her intake of more complex carbohydrates including rice, past, and bread.  5) vitamin D deficiency: I advised her to continue vitamin D 50,000 units weekly for the next 12 weeks.  6) Chronic Care/Health Maintenance:  -Patient is not on ACEI/ARB and Statin medications and encouraged to continue to follow up with Ophthalmology, Podiatrist at least yearly or according to recommendations, and advised to   stay away from smoking. I have recommended yearly flu vaccine and pneumonia vaccination at least every 5 years;  and  sleep for at least 7 hours a day.  - 25 minutes of time was spent on the care of this patient , 50% of which was applied for counseling on diabetes complications and their preventions.   - I advised patient to maintain close follow up with [name], NP for primary care needs.  Follow up plan: - Return in about 6 months (around 06/30/2017) for follow up with pre-visit labs.  Glade Lloyd, MD Phone: [phone]  Fax:  [phone]   12/29/2016, 2:39 PM

## 2017-03-01 ENCOUNTER — Encounter: Payer: Self-pay | Admitting: Gastroenterology

## 2017-04-26 ENCOUNTER — Ambulatory Visit (INDEPENDENT_AMBULATORY_CARE_PROVIDER_SITE_OTHER): Payer: Medicare Other | Admitting: Gastroenterology

## 2017-04-26 ENCOUNTER — Encounter: Payer: Self-pay | Admitting: Gastroenterology

## 2017-04-26 DIAGNOSIS — K5000 Crohn's disease of small intestine without complications: Secondary | ICD-10-CM | POA: Diagnosis not present

## 2017-04-26 NOTE — Progress Notes (Signed)
cc'ed to pcp °

## 2017-04-26 NOTE — Patient Instructions (Addendum)
DRINK WATER TO KEEP YOUR URINE LIGHT YELLOW.   IF YOU CONSUME DAIRY, ADD LACTASE 3 PILLS WITH MEALS UP TO THREE TIMES A DAY.  USE FIBER POWDER OR 1 PACKET ONCE OR TWICE DAILY OR DRINK HERBAL TEA TO PREVENT CONSTIPATION(HOUSE OF [address] [place], [place], [place] # [phone]) .  CONTINUE OMEPRAZOLE.  TAKE 30 MINUTES PRIOR TO YOUR FIRST MEAL.   PLEASE CALL WITH QUESTIONS OR CONCERNS.  FOLLOW UP IN 6 MOS. HAVE LABS DRAWN SENT TO ME AT FAX #: [phone].

## 2017-04-26 NOTE — Assessment & Plan Note (Addendum)
SYMPTOMS FAIRLY WELL CONTROLLED. SYMPTOMS EXACERBATED BY LACTOSE INTAKE AND CONSTIPATION.  DRINK WATER TO KEEP YOUR URINE LIGHT YELLOW. IF YOU CONSUME DAIRY, ADD LACTASE 3 PILLS WITH MEALS UP TO THREE TIMES A DAY. USE FIBER POWDER OR 1 PACKET ONCE OR TWICE DAILY OR DRINK HERBAL TEA TO PREVENT CONSTIPATION(HOUSE OF [address], [place], [place] # [phone]) . CONTINUE OMEPRAZOLE.  TAKE 30 MINUTES PRIOR TO YOUR FIRST MEAL. PLEASE CALL WITH QUESTIONS OR CONCERNS. FOLLOW UP IN 6 MOS. HAVE LABS DRAWN SENT TO ME AT FAX #: [phone].

## 2017-04-26 NOTE — Progress Notes (Signed)
On recall  °

## 2017-04-26 NOTE — Addendum Note (Signed)
Addended by: Inge Rise on: 04/26/2017 03:46 PM   Modules accepted: Miquel Dunn

## 2017-04-26 NOTE — Progress Notes (Signed)
Subjective:    Patient ID: Tamara Todd, female    DOB: [birth year], 76 y.o.   MRN: [id number]  [name], NP  HPI Having trouble with: PAIN IN STOMACH AND BOWEL. LAST TWO WEEKS EVERY OTHER DAY OR SO, TYLENOL HELPS.DULLPAIN,  STARTS IN LEFT SIDE USUALLY AND MOVES TO THE RIGHT(UPPER) AND CAN SIT IN THE MIDDLE OF THE ABDOMEN. NOTICES MOSTLY AT NIGHT AND DOESN'T BOTHER HER AS MUCH DURING THE DAY TIME. CHANGE COLOR:YELLOW AND MAY HAVE A #7 AND #4. BMs: HAVEN'T BEEN MOVING THAT MUCH. HAS A LOT OF GAS. EATING AND HAS A GOOD APPETITE. RARE MILK. LIKES CHEESE. ICE CREAM: NOT TOO MUCH. A LITTLE HEARTBURN. WORSE: IF DOESN'T MOVE HER BOWELS. BETTER: AFTER A BM. FIBER: NOT ENOUGH. WATER: NOT ENOUGH. DRANK A PEPSI LAST NIGHT AND IMMEDIATELY GOT LOOSE STOOLS. INITIAL DIAGNOSIS OF CROHN'S WAS IN HER SMALL BOWEL. HAS MILD LOW BACK PAIN. PAIN IN HANDS: EVERY DAY. NO SORES IN MOUTH, OR RASH ON LEGS.    PT DENIES FEVER, CHILLS, HEMATOCHEZIA, HEMATEMESIS, nausea, vomiting, melena, CHEST PAIN, SHORTNESS OF BREATH, CHANGE IN BOWEL IN HABITS, constipation, problems swallowing, OR heartburn or indigestion.  Past Medical History:  Diagnosis Date  . Crohn's disease ([place])    used to be followed by Dr. Algis Greenhouse. diagnosis in her 83s. multiple surgeries. remicade allergy.  . Diabetes ([place])   . GERD (gastroesophageal reflux disease)   . HTN (hypertension)   . Hyperlipidemia   . Hypertension   . Osteoporosis       . Pancreatitis    09/2015    Past Surgical History:  Procedure Laterality Date  . BACTERIAL OVERGROWTH TEST N/A 02/24/2016   POSITIVE for small bowel bacterial overgrowth   .        Marland Kitchen BOWEL RESECTION     TWICE for stricture, including right colectomy with terminal ileal resection  . COLONOSCOPY    . COLONOSCOPY N/A 02/10/2016   Dr. Oneida Alar: normal neo terminal ileum, diverticula in sigmoid colon, non-bleeding internal hemorrhoids, adenomatous rectal polyp.   . ESOPHAGOGASTRODUODENOSCOPY N/A  02/10/2016   Dr. Oneida Alar: chronic gastritis, small hiatal hernia, few benign-appearing gastric polyps, no source for weight loss.   Marland Kitchen PARTIAL HYSTERECTOMY    .        Marland Kitchen RECTOVAGINAL FISTULA CLOSURE  2004   with temporary colostomy and subsequent take down, all at [facility].     Allergies  Allergen Reactions  . Lisinopril     Cough   . Mercaptopurine     Neutropenia, pancreatitis  . Remicade [Infliximab] Other (See Comments)    Couldn't breath  . Codeine Rash    Current Outpatient Prescriptions  Medication Sig Dispense Refill  . acetaminophen (TYLENOL) 650 MG CR tablet Take 1 tablet (650 mg total) by mouth every 8 (eight) hours as needed for pain.    Marland Kitchen amLODipine (NORVASC) 10 MG tablet Take 10 mg by mouth daily.    . calcium carbonate (OS-CAL - DOSED IN MG OF ELEMENTAL CALCIUM) 1250 (500 Ca) MG tablet Take 1 tablet by mouth 2 (two) times daily with a meal.    . cyanocobalamin (,VITAMIN B-12,) 1000 MCG/ML injection Inject 1 mL into the muscle every 14 (fourteen) days.    . magnesium chloride (SLOW-MAG) 64 MG TBEC SR tablet Take 1 tablet (64 mg total) by mouth 2 (two) times daily.    . metoprolol succinate (TOPROL-XL) 25 MG 24 hr tablet Take 25 mg by mouth daily.    Marland Kitchen omeprazole (PRILOSEC)  20 MG capsule Take 20 mg by mouth daily.    . Vitamin D, Ergocalciferol, (DRISDOL) 50000 units CAPS capsule Take 50,000 Units by mouth once a week. Takes on Mondays.        Review of Systems PER HPI OTHERWISE ALL SYSTEMS ARE NEGATIVE.     Objective:   Physical Exam  Constitutional: She is oriented to person, place, and time. She appears well-developed and well-nourished. No distress.  HENT:  Head: Normocephalic and atraumatic.  Mouth/Throat: Oropharynx is clear and moist. No oropharyngeal exudate.  Eyes: Pupils are equal, round, and reactive to light. No scleral icterus.  Neck: Normal range of motion. Neck supple.  Cardiovascular: Normal rate, regular rhythm and normal heart sounds.     Pulmonary/Chest: Effort normal and breath sounds normal. No respiratory distress.  Abdominal: Soft. Bowel sounds are normal. She exhibits no distension. There is no tenderness.  Musculoskeletal: She exhibits no edema.  Lymphadenopathy:    She has no cervical adenopathy.  Neurological: She is alert and oriented to person, place, and time.  NO FOCAL DEFICITS  Psychiatric:  FLAT AFFECT, NL MOOD  Vitals reviewed.     Assessment & Plan:

## 2017-07-03 ENCOUNTER — Ambulatory Visit: Payer: Medicare Other | Admitting: "Endocrinology

## 2017-07-06 ENCOUNTER — Other Ambulatory Visit: Payer: Self-pay | Admitting: "Endocrinology

## 2017-07-06 ENCOUNTER — Other Ambulatory Visit: Payer: Self-pay

## 2017-07-06 DIAGNOSIS — E1165 Type 2 diabetes mellitus with hyperglycemia: Secondary | ICD-10-CM

## 2017-07-07 LAB — RENAL FUNCTION PANEL
ALBUMIN MSPROF: 3.7 g/dL (ref 3.6–5.1)
BUN: 16 mg/dL (ref 7–25)
CALCIUM: 9.8 mg/dL (ref 8.6–10.4)
CO2: 29 mmol/L (ref 20–32)
Chloride: 106 mmol/L (ref 98–110)
Creat: 0.6 mg/dL (ref 0.60–0.93)
GLUCOSE: 105 mg/dL (ref 65–139)
POTASSIUM: 3.8 mmol/L (ref 3.5–5.3)
Phosphorus: 2.7 mg/dL (ref 2.1–4.3)
SODIUM: 141 mmol/L (ref 135–146)

## 2017-07-07 LAB — HEMOGLOBIN A1C
HEMOGLOBIN A1C: 5.7 %{Hb} — AB (ref <5.7)
MEAN PLASMA GLUCOSE: 117 (calc)
eAG (mmol/L): 6.5 (calc)

## 2017-07-14 ENCOUNTER — Encounter: Payer: Self-pay | Admitting: "Endocrinology

## 2017-07-14 ENCOUNTER — Ambulatory Visit: Payer: Medicare Other | Admitting: "Endocrinology

## 2017-07-14 VITALS — BP 144/77 | HR 74 | Ht 59.0 in | Wt 115.0 lb

## 2017-07-14 DIAGNOSIS — E119 Type 2 diabetes mellitus without complications: Secondary | ICD-10-CM | POA: Diagnosis not present

## 2017-07-14 DIAGNOSIS — E559 Vitamin D deficiency, unspecified: Secondary | ICD-10-CM | POA: Diagnosis not present

## 2017-07-14 DIAGNOSIS — I10 Essential (primary) hypertension: Secondary | ICD-10-CM

## 2017-07-14 MED ORDER — VITAMIN D3 125 MCG (5000 UT) PO CAPS: 5000.0000 [IU] | ORAL_CAPSULE | Freq: Every day | ORAL | 0 refills | Status: DC

## 2017-07-14 NOTE — Progress Notes (Signed)
Subjective:    Patient ID: Tamara Todd, female    DOB: 18-Nov-1940. Patient is being seen in  F/u  for management of diabetes requested by  [name]  Past Medical History:  Diagnosis Date  . Crohn's disease ([place])    used to be followed by Dr. Algis Greenhouse. diagnosis in her 22s. multiple surgeries. remicade allergy.  . Diabetes ([place])   . GERD (gastroesophageal reflux disease)   . HTN (hypertension)   . Hyperlipidemia   . Hypertension   . Osteoporosis       . Pancreatitis    09/2015   Past Surgical History:  Procedure Laterality Date  . BACTERIAL OVERGROWTH TEST N/A 02/24/2016   POSITIVE for small bowel bacterial overgrowth   . BIOPSY  02/10/2016   Procedure: BIOPSY;  Surgeon: Danie Binder, MD;  Location: [facility];  Service: Endoscopy;;  Random colon biopsies Gastric biopsies for h pylori gastric polyp biopsies  . BOWEL RESECTION     TWICE for stricture, including right colectomy with terminal ileal resection  . COLONOSCOPY    . COLONOSCOPY N/A 02/10/2016   Dr. Oneida Alar: normal neo terminal ileum, diverticula in sigmoid colon, non-bleeding internal hemorrhoids, adenomatous rectal polyp.   . ESOPHAGOGASTRODUODENOSCOPY N/A 02/10/2016   Dr. Oneida Alar: chronic gastritis, small hiatal hernia, few benign-appearing gastric polyps, no source for weight loss.   Marland Kitchen PARTIAL HYSTERECTOMY    . POLYPECTOMY  02/10/2016   Procedure: POLYPECTOMY;  Surgeon: Danie Binder, MD;  Location: [facility];  Service: Endoscopy;;  Rectal polyps x 2 removed 1 by hot snare and number 2 via cold forceps  . RECTOVAGINAL FISTULA CLOSURE  2004   with temporary colostomy and subsequent take down, all at [facility].    Social History   Socioeconomic History  . Marital status: Married    Spouse name: None  . Number of children: 1  . Years of education: None  . Highest education level: None  Social Needs  . Financial resource strain: None  . Food insecurity - worry: None  . Food insecurity - inability:  None  . Transportation needs - medical: None  . Transportation needs - non-medical: None  Occupational History  . Occupation: retired  Tobacco Use  . Smoking status: Never Smoker  . Smokeless tobacco: Never Used  Substance and Sexual Activity  . Alcohol use: No    Alcohol/week: 0.0 oz  . Drug use: No  . Sexual activity: None  Other Topics Concern  . None  Social History Narrative  . None   Outpatient Encounter Medications as of 07/14/2017  Medication Sig  . acetaminophen (TYLENOL) 650 MG CR tablet Take 1 tablet (650 mg total) by mouth every 8 (eight) hours as needed for pain.  Marland Kitchen amLODipine (NORVASC) 10 MG tablet Take 10 mg by mouth daily.  . calcium carbonate (OS-CAL - DOSED IN MG OF ELEMENTAL CALCIUM) 1250 (500 Ca) MG tablet Take 1 tablet by mouth 2 (two) times daily with a meal.  . Cholecalciferol (VITAMIN D3) 5000 units CAPS Take 1 capsule (5,000 Units total) by mouth daily.  . cyanocobalamin (,VITAMIN B-12,) 1000 MCG/ML injection Inject 1 mL into the muscle every 14 (fourteen) days.  . magnesium chloride (SLOW-MAG) 64 MG TBEC SR tablet Take 1 tablet (64 mg total) by mouth 2 (two) times daily.  . metoprolol succinate (TOPROL-XL) 25 MG 24 hr tablet Take 25 mg by mouth daily.  Marland Kitchen omeprazole (PRILOSEC) 20 MG capsule Take 20 mg by mouth  daily.  . [DISCONTINUED] Vitamin D, Ergocalciferol, (DRISDOL) 50000 units CAPS capsule Take 50,000 Units by mouth once a week. Takes on Mondays.   No facility-administered encounter medications on file as of 07/14/2017.    ALLERGIES: Allergies  Allergen Reactions  . Lisinopril     Cough   . Mercaptopurine     Neutropenia, pancreatitis  . Remicade [Infliximab] Other (See Comments)    Couldn't breath  . Codeine Rash   VACCINATION STATUS:  There is no immunization history on file for this patient.  Diabetes  She presents for her follow-up diabetic visit. She has type 2 diabetes mellitus. Onset time: She was diagnosed at approximate age of  29 years . Her disease course has been stable. There are no hypoglycemic associated symptoms. Pertinent negatives for hypoglycemia include no confusion, headaches, pallor or seizures. There are no diabetic associated symptoms. Pertinent negatives for diabetes include no chest pain, no polydipsia, no polyphagia and no polyuria. There are no hypoglycemic complications. Symptoms are stable. There are no diabetic complications. Risk factors for coronary artery disease include diabetes mellitus and sedentary lifestyle. Current diabetic treatment includes oral agent (monotherapy). She is compliant with treatment most of the time. Her weight is increasing steadily. She is following a generally unhealthy diet. When asked about meal planning, she reported none. She has not had a previous visit with a dietitian. She rarely participates in exercise. An ACE inhibitor/angiotensin II receptor blocker is contraindicated (She is allergic to ACE inhibitor's.).    Review of Systems  Constitutional: Negative for chills, fever and unexpected weight change.  HENT: Negative for trouble swallowing and voice change.   Eyes: Negative for visual disturbance.  Respiratory: Negative for cough, shortness of breath and wheezing.   Cardiovascular: Negative for chest pain, palpitations and leg swelling.  Gastrointestinal: Negative for diarrhea, nausea and vomiting.  Endocrine: Negative for cold intolerance, heat intolerance, polydipsia, polyphagia and polyuria.  Musculoskeletal: Negative for arthralgias and myalgias.  Skin: Negative for color change, pallor, rash and wound.  Neurological: Negative for seizures and headaches.  Psychiatric/Behavioral: Negative for confusion and suicidal ideas.    Objective:    BP (!) 144/77   Pulse 74   Ht 4' 11"  (1.499 m)   Wt 115 lb (52.2 kg)   BMI 23.23 kg/m   Wt Readings from Last 3 Encounters:  07/14/17 115 lb (52.2 kg)  04/26/17 112 lb (50.8 kg)  12/29/16 111 lb (50.3 kg)     Physical Exam  Constitutional: She is oriented to person, place, and time.  Light build.  HENT:  Head: Normocephalic and atraumatic.  Eyes: EOM are normal.  Neck: Normal range of motion. Neck supple. No tracheal deviation present. No thyromegaly present.  Cardiovascular: Normal rate and regular rhythm.  Pulmonary/Chest: Effort normal and breath sounds normal.  Abdominal: Soft. Bowel sounds are normal. There is no tenderness. There is no guarding.  Musculoskeletal: Normal range of motion. She exhibits no edema.  Neurological: She is alert and oriented to person, place, and time. She has normal reflexes. No cranial nerve deficit. Coordination normal.  Skin: Skin is warm and dry. No rash noted. No erythema. No pallor.  Psychiatric: She has a normal mood and affect. Judgment normal.     CMP ( most recent) CMP     Component Value Date/Time   NA 141 07/06/2017 1448   K 3.8 07/06/2017 1448   CL 106 07/06/2017 1448   CO2 29 07/06/2017 1448   GLUCOSE 105 07/06/2017 1448   BUN  16 07/06/2017 1448   CREATININE 0.60 07/06/2017 1448   CALCIUM 9.8 07/06/2017 1448   PROT 6.9 12/23/2016 1148   ALBUMIN 3.5 (L) 12/23/2016 1148   AST 22 12/23/2016 1148   ALT 18 12/23/2016 1148   ALKPHOS 99 12/23/2016 1148   BILITOT 0.3 12/23/2016 1148   GFRNONAA >60 04/05/2016 0645   GFRAA >60 04/05/2016 0645     Diabetic Labs (most recent): Lab Results  Component Value Date   HGBA1C 5.7 (H) 07/06/2017   HGBA1C 5.5 12/23/2016   HGBA1C 5.8 (H) 06/13/2016     Assessment & Plan:   1. controlled type 2 diabetes mellitus with complication, without long-term current use of insulin ([place])  - Patient has currently controlled asymptomatic type 2 DM since  76 years of age,  with most recent A1c of  5.7% improving from 6.5 %. This is despite the fact that she was taken off of all medications for diabetes.  Recent labs reviewed.  -I discussed with the patient the need to avoid acute and chronic complications  of diabetes which include CAD, CVA, CKD, retinopathy, and neuropathy.  - I have counseled the patient on diet management  by adopting a carbohydrate restricted/protein rich diet.  - I encouraged the patient to switch to  unprocessed or minimally processed complex starch and increased protein intake (animal or plant source), fruits, and vegetables.  - Patient is advised to stick to a routine mealtimes to eat 3 meals  a day and  some snacks if necessary.  - I have approached patient with the following individualized plan to manage diabetes and patient agrees:     - At this time, anti-diabetes therapy is unnecessary. - If therapy is necessary in her case, insulin would be a better choice for her. - Patient is not a good candidate for incretin therapy, metformin, nor SGLT2 inhibitors.  - Patient specific target  A1c;  LDL, HDL, Triglycerides, and  Waist Circumference were discussed in detail.  2) BP/HTN: uncontrolled. she reports allergies to ACE inhibitors. She is on amlodipine 10 mg by mouth daily and metoprolol 25 mg every day. I advised her to be consistent on this medications. 3) Lipids/HPL:controlled.  4)  Weight/Diet:  she is light build. She is advised to increase her intake of more complex carbohydrates including rice, past, and bread.  5) vitamin D deficiency: I would proceed to discontinue vitamin D 50,000 units, I advised her to maintain vitamin D 3 5000 units daily for the next 90 days.  6) Chronic Care/Health Maintenance:  -Patient is not on ACEI/ARB and Statin medications and encouraged to continue to follow up with Ophthalmology, Podiatrist at least yearly or according to recommendations, and advised to   stay away from smoking. I have recommended yearly flu vaccine and pneumonia vaccination at least every 5 years;  and  sleep for at least 7 hours a day.  - I advised patient to maintain close follow up with [name] for primary care needs.  Follow up plan: - Return  in about 1 year (around 07/14/2018) for follow up with pre-visit labs.  Glade Lloyd, MD Phone: [phone]  Fax: [phone]  -  This note was partially dictated with voice recognition software. Similar sounding words can be transcribed inadequately or may not  be corrected upon review.  07/14/2017, 10:44 AM

## 2017-09-18 ENCOUNTER — Encounter: Payer: Self-pay | Admitting: Gastroenterology

## 2017-09-29 IMAGING — CT CT ABD-PELV W/O CM
2 of 4 series · 14 of 46 positions shown, 16 images · non-contrast
Comparison: MRI abdomen 03/23/2016

CLINICAL DATA: Intermittent abdominal pain and vomiting for 2
weeks. History of Crohn's disease and pancreatitis. History of bowel
resection and rectovaginal fistula closure.

EXAM:
CT ABDOMEN AND PELVIS WITHOUT CONTRAST
TECHNIQUE: Multidetector CT imaging of the abdomen and pelvis was performed
following the standard protocol without IV contrast.

[Series 2: routine abd pel without · axial · non-contrast · 0.60mm/px · z∈[-414,-64]mm · 11 of 84 slices shown, 13 images]
[im 7/84  soft-tissue]
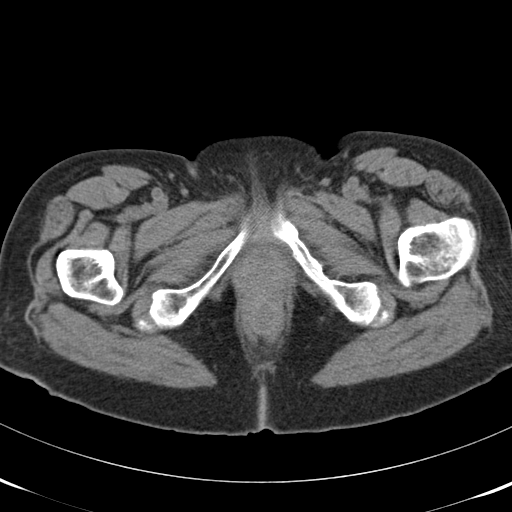
[im 7/84  bone]
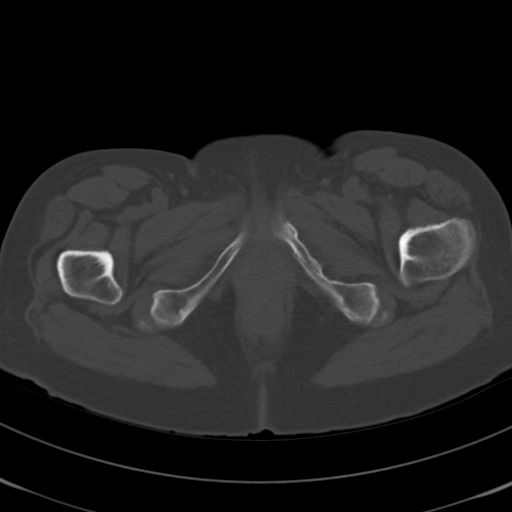
[im 14/84  soft-tissue]
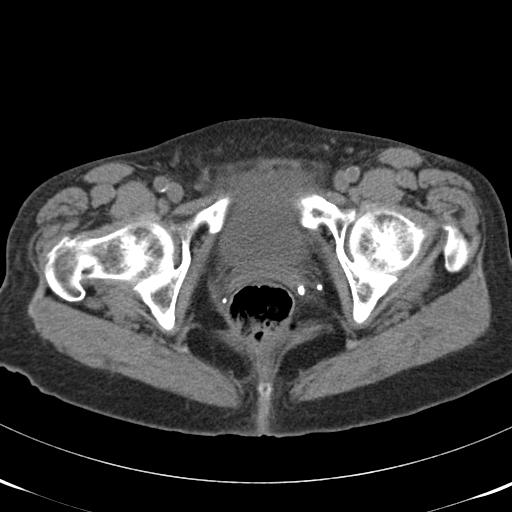
[im 21/84  soft-tissue]
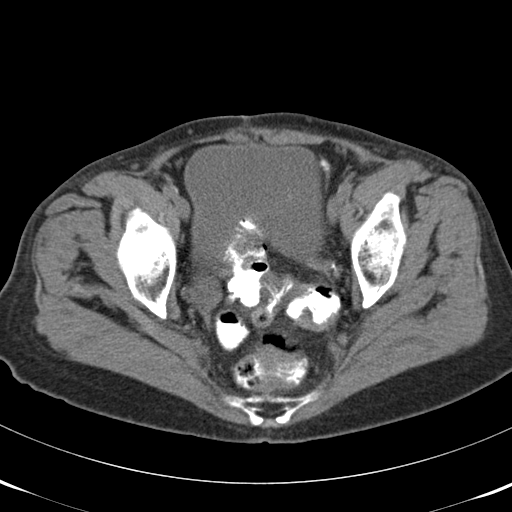
[im 28/84  soft-tissue]
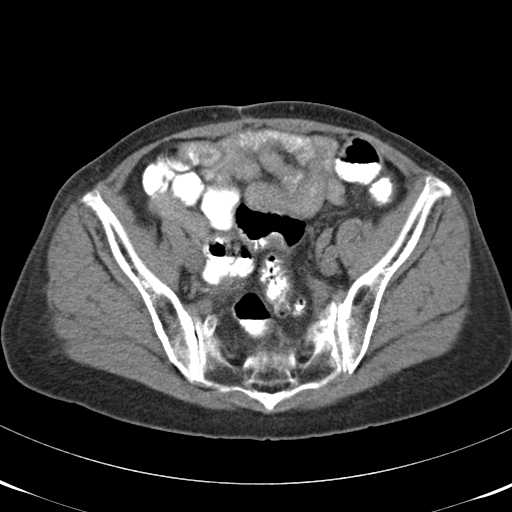
[im 35/84  soft-tissue]
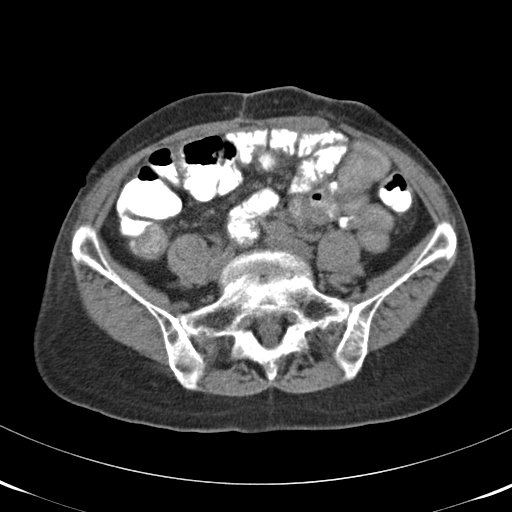
[im 42/84  soft-tissue]
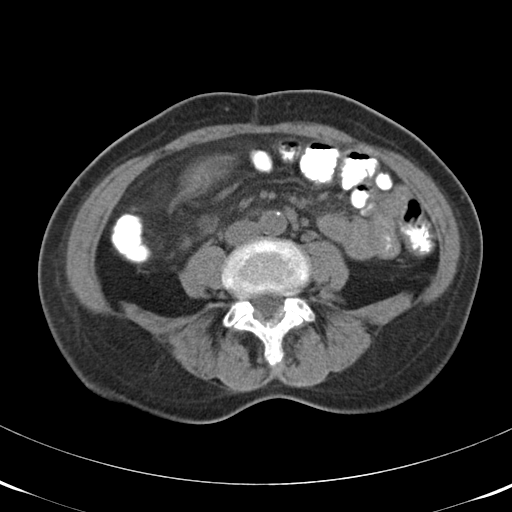
[im 49/84  soft-tissue]
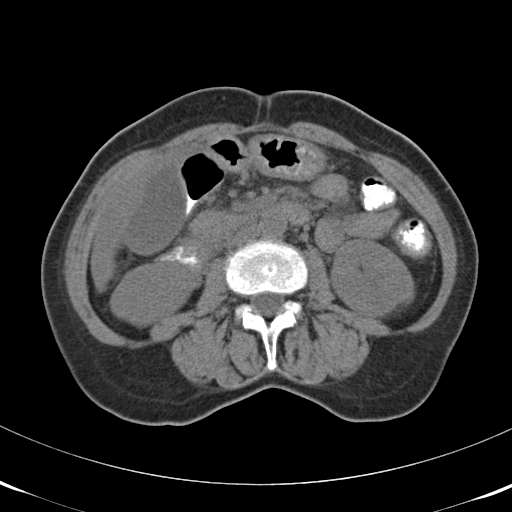
[im 56/84  soft-tissue]
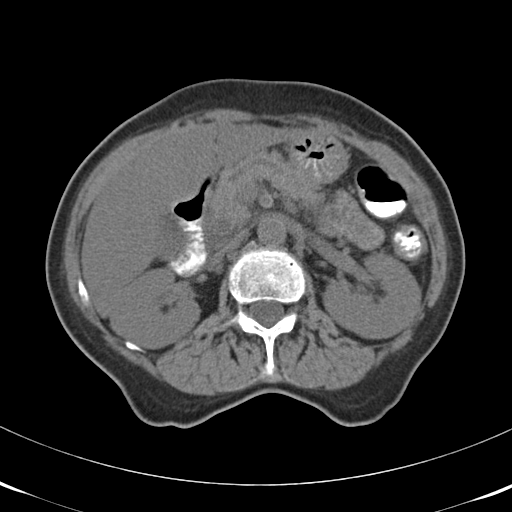
[im 63/84  soft-tissue]
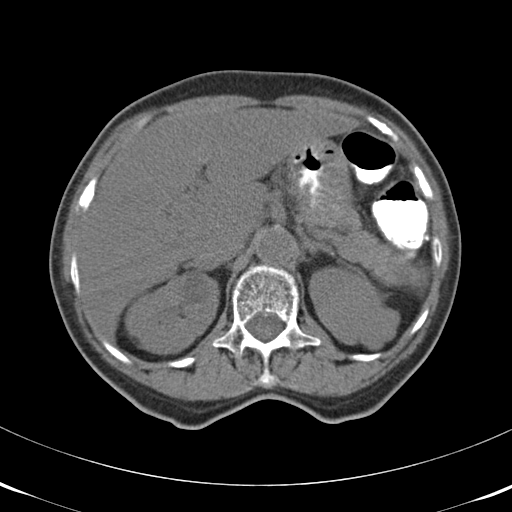
[im 63/84  bone]
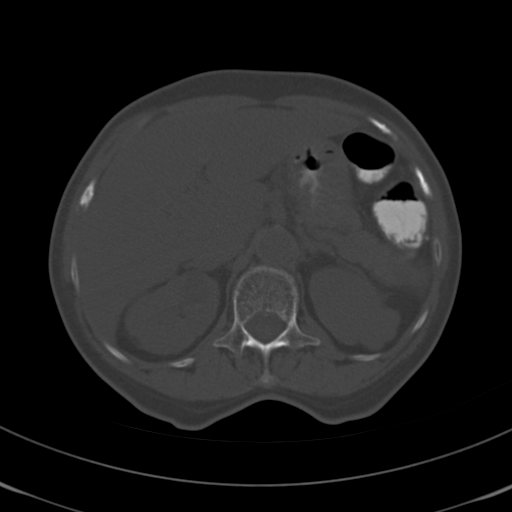
[im 70/84  soft-tissue]
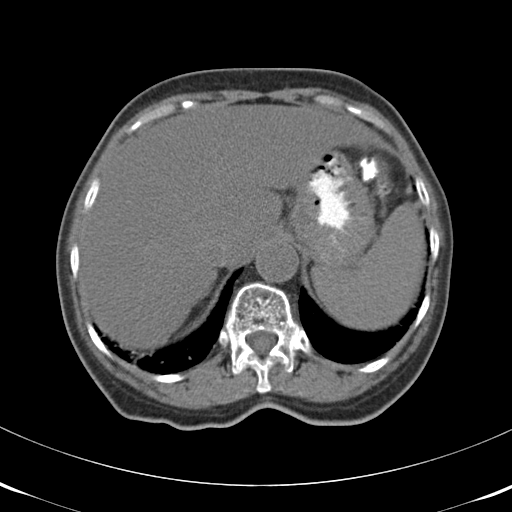
[im 77/84  soft-tissue]
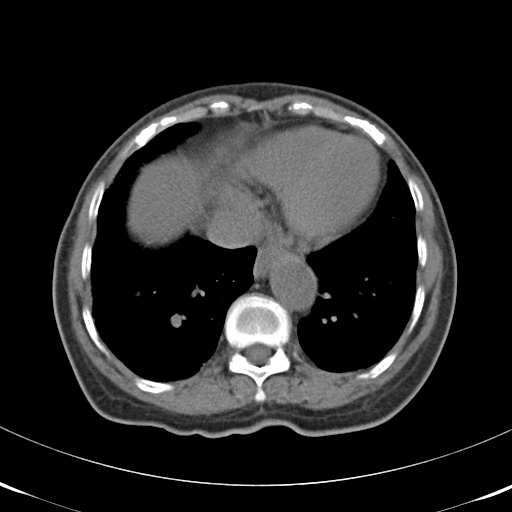

[Series 4: coronal · coronal · 0.61mm/px · 3 of 112 slices shown]
[im 38/112  soft-tissue]
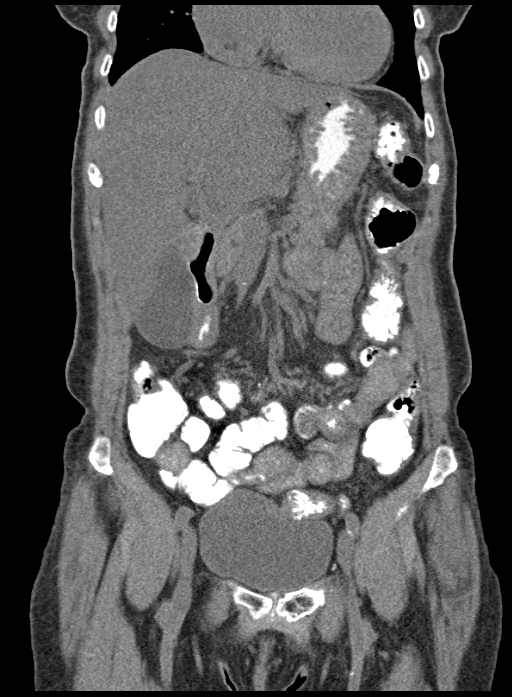
[im 50/112  soft-tissue]
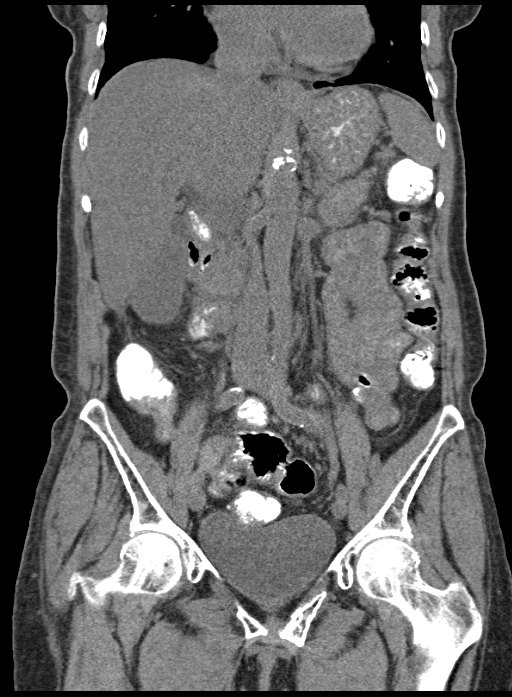
[im 62/112  soft-tissue]
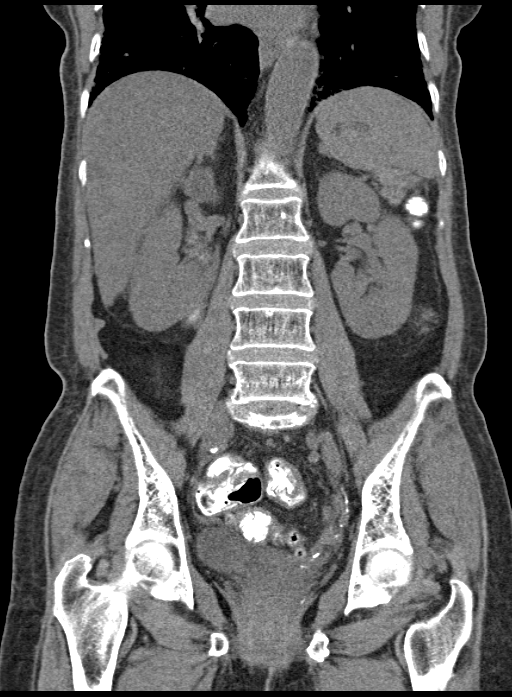

[14 of 46 positions shown; findings below may reference images not displayed]

FINDINGS: Lower chest: Patchy dependent atelectasis posteriorly within the
lung bases. Tiny parenchymal calcifications in the posterior right
lung base. Mild right middle and lower lobe subpleural interstitial
opacities could relate to mild fibrosis. No acute consolidation or
effusion. Heart size is slightly enlarged.

Hepatobiliary: Liver size upper normal at 17 cm. Noncontrast
appearance of the liver otherwise unremarkable. Gallbladder slightly
prominent but no definite intraluminal stones are visualized. No
wall thickening. Common bile duct is slightly tortuous and mildly
prominent, measuring up to 8-9 mm; at the head of the pancreas, this
measures 6 mm. It is grossly unchanged compared to recent MRI.
Pancreatic duct at the head neck and proximal body appears slightly
increased in size compared to MRI, it measures approximately 3 mm in
diameter. No definite calcified stones are seen in the region of the
common bile duct.

Pancreas: No peripancreatic fluid collections. Pancreatic duct
mildly prominent, measuring up to 3 mm.

Spleen: Grossly normal non contrasted appearance.

Adrenals/Urinary Tract: Adrenal glands within normal limits. No
calcified stones or hydronephrosis involving the kidneys. 2.2 cm
low-density lesion in the mid right kidney, corresponding to the
cyst described on MRI. Additional 9 mm hypodense lesion midpole
right kidney. There is a 2.4 cm low-density lesion in the mid left
kidney, corresponding to the cyst on MRI. Lobulated exophytic 3 cm
hypodense lesion from the upper pole of left kidney, corresponds to
MRI cyst. Ureters are nondilated. The bladder is normal.

Stomach/Bowel: There is mild gastric wall thickening with suboptimal
intra luminal distention. There is no dilated small bowel. The
appendix is not well identified. There are patchy areas of wall
thickening involving the rectosigmoid colon. No evidence of a
perforation. Few scattered colonic diverticula.

Vascular/Lymphatic: Limited without intravenous contrast.
Atherosclerotic vascular calcifications of the aorta. Scattered
mesenteric and retroperitoneal subcentimeter lymph nodes.

Reproductive: Uterus is not identified and presumably absent.
Multiple calcified phleboliths.

Other: Mild soft tissue stranding is present within the right upper
quadrant near the fundus of the gallbladder.

Musculoskeletal: Heterogenous attenuation of the bones could relate
to a osteopenia. There is evidence of an old left inferior pubic
ramus fracture. Mild SI joint sclerosis.
IMPRESSION: 1. Slight dilatation of the gallbladder, no definite intraluminal
stones or wall thickening. A small amount of soft tissue
thickening/stranding is present in the right upper quadrant near the
hepatic margin and adjacent to the gallbladder fundus, this is
nonspecific. If signs or symptoms are referable to the gallbladder,
further evaluation with ultrasound may be obtained. Common bile duct
appears slightly enlarged ; pancreatic duct slightly more prominent
compared to recent MRI. Suggest correlation with laboratory values.
2. Patchy wall thickening involving the rectosigmoid colon, could
relate to history of Crohn's disease. No evidence for perforation or
abscess.
3. Multiple hypodense lesions within the bilateral kidneys,
corresponding to previously noted cysts on MRI.
4. Mild gastric wall thickening, likely related to underdistention
although mild gastritis not excluded.
5. Old left inferior pubic ramus fracture.

## 2017-10-02 ENCOUNTER — Encounter: Payer: Self-pay | Admitting: Gastroenterology

## 2017-10-12 ENCOUNTER — Other Ambulatory Visit: Payer: Self-pay | Admitting: "Endocrinology

## 2017-12-06 ENCOUNTER — Ambulatory Visit: Payer: Medicare Other | Admitting: Gastroenterology

## 2018-01-03 ENCOUNTER — Encounter: Payer: Self-pay | Admitting: *Deleted

## 2018-01-03 ENCOUNTER — Other Ambulatory Visit: Payer: Self-pay

## 2018-01-03 ENCOUNTER — Encounter: Payer: Self-pay | Admitting: Gastroenterology

## 2018-01-03 ENCOUNTER — Telehealth: Payer: Self-pay | Admitting: *Deleted

## 2018-01-03 ENCOUNTER — Ambulatory Visit: Payer: Medicare Other | Admitting: Gastroenterology

## 2018-01-03 DIAGNOSIS — R1033 Periumbilical pain: Secondary | ICD-10-CM | POA: Diagnosis not present

## 2018-01-03 DIAGNOSIS — K50018 Crohn's disease of small intestine with other complication: Secondary | ICD-10-CM

## 2018-01-03 MED ORDER — HYOSCYAMINE SULFATE SL 0.125 MG SL SUBL: SUBLINGUAL_TABLET | SUBLINGUAL | 11 refills | Status: DC

## 2018-01-03 NOTE — Assessment & Plan Note (Signed)
SYMPTOMS NOT IDEALLY CONTROLLED. DIFFERENTIAL DIAGNOSIS INCLUDES: IBS-D, MICROSCOPIC COLITIS, LESS LIKELY GIARDIASIS, OR C DIFF COLITIS.  SUBMIT STOOL STUDIES: C DIFF PCR/GIARADIA Ag. COMPLETE CT SCAN WITHIN 7 DAYS. IF YOU HAVE ACTIVE DISEASE, THE NEXT STEP IN YOUR MEDICATIONS WOULD BE HUMIRA, REMICADE, OR ENTYVIO. THEY ALL HAVE THE SAME SIDE EFFECTS AND : SUPPRESS YOUR IMMUNE SYSTEM, AND VERY RARELY INTESTINAL LYMPHOMA, DEGENERATIVE BRAIN DISEASE IMODIUM OR KAOPECTATE OR LEVSIN SL PRN DIARRHEA. FOLLOW UP IN 4 MOS.

## 2018-01-03 NOTE — Telephone Encounter (Signed)
Checked [employer] and received message "require authorization for Inpatient Services only". No PA needed for CT scan

## 2018-01-03 NOTE — Progress Notes (Signed)
Opened in error

## 2018-01-03 NOTE — Patient Instructions (Addendum)
SUBMIT STOOL STUDIES.  COMPLETE CT SCAN WITHIN 7 DAYS. IF YOU HAVE ACTIVE DISEASE, THE NEXT STEP IN YOUR MEDICATIONS WOULD BE HUMIRA, REMICADE, OR ENTYVIO. THEY ALL HAVE THE SAME SIDE EFFECTS AND : SUPPRESS YOUR IMMUNE SYSTEM, AND VERY RARELY INTESTINAL LYMPHOMA, DEGENERATIVE BRAIN DISEASE  USE IMODIUM OR KAOPECTATE OR LEVSIN SUBLINGUAL WHEN NEEDED TO CONTROL DIARRHEA.  FOLLOW UP IN 4 MOS.

## 2018-01-03 NOTE — Progress Notes (Signed)
Subjective:    Patient ID: Tamara Todd, female    DOB: 1940-09-19, 77 y.o.   MRN: [id number]  [name]   HPI Lost husband in oct 2018. He WAS DIABETIC. Has abdominal pain off and on associated diarrhea. Happens ever now and now. Has stiff hands. Taking Tylenol. Last imaging on CT 2017. BMs: VARIES, NL AND THEN CAN BE EVERY HOUR. NO NOCTURNAL STOOLS. WENT TO [place] IN APR 2019. THE LAST DAY AFTER EATING OUT. BOWELS WERE LIKE WATER AND UNABLE TO CONTROL IT. NO ABX.HAS WELL WATER. HAPPENS 2-3X/MO AND LAST 1-2 DAYS AND THEN SHE'LL BE FINE. LAST FLARE: MILD, SUN. AFTER CHURCH. MILK: NO, CHEESE: < 1-2X/WEEK, ICE CREAM: RARE.  ABDOMINAL PAIN: ACROSS MIDDLE, BETTER AFTER BMs SOMETIMES, MAY RADIATE TO THE SIDES, BETTER: TIME, TYLENOL, WORSE: IF DRINKS A SODA OR EATS, LAST AS ABOUT 1 HR. WEIGHT: PRETTY GOOD. WEIGHT UP FROM 111 LBS TO 120 LBS. MAX WEIGHT: 128 LBS.   PT DENIES FEVER, CHILLS, HEMATOCHEZIA, HEMATEMESIS, DYSURIA, HEMATURIA, nausea, vomiting, melena, CHEST PAIN, SHORTNESS OF BREATH, CHANGE IN BOWEL IN HABITS, constipation, abdominal pain, problems swallowing, RECTAL PAIN/PRESSURE, ODYNOPHAGIA, OR, heartburn or indigestion. NO TOBACCO PRODUCTS OR NSAIDs..  Past Medical History:  Diagnosis Date  . Crohn's disease ([place])    used to be followed by Dr. Algis Greenhouse. diagnosis in her 26s. multiple surgeries. remicade allergy.  . Diabetes ([place])   . GERD (gastroesophageal reflux disease)   . HTN (hypertension)   . Hyperlipidemia   . Hypertension   . Osteoporosis       . Pancreatitis    09/2015   Past Surgical History:  Procedure Laterality Date  . BACTERIAL OVERGROWTH TEST N/A 02/24/2016   POSITIVE for small bowel bacterial overgrowth   . BIOPSY  02/10/2016   Procedure: BIOPSY;  Surgeon: Danie Binder, MD;  [facility];  Service: Endoscopy;;  Random colon biopsies Gastric biopsies for h pylori gastric polyp biopsies  . BOWEL RESECTION     TWICE for stricture, including  right colectomy with terminal ileal resection  . COLONOSCOPY    . COLONOSCOPY N/A 02/10/2016   Dr. Oneida Alar: normal neo terminal ileum, diverticula in sigmoid colon, non-bleeding internal hemorrhoids, adenomatous rectal polyp.   . ESOPHAGOGASTRODUODENOSCOPY N/A 02/10/2016   Dr. Oneida Alar: chronic gastritis, small hiatal hernia, few benign-appearing gastric polyps, no source for weight loss.   Marland Kitchen PARTIAL HYSTERECTOMY    . POLYPECTOMY  02/10/2016   Procedure: POLYPECTOMY;  Surgeon: Danie Binder, MD;  [facility];  Service: Endoscopy;;  Rectal polyps x 2 removed 1 by hot snare and number 2 via cold forceps  . RECTOVAGINAL FISTULA CLOSURE  2004   with temporary colostomy and subsequent take down, all at [facility].    Allergies  Allergen Reactions  . Lisinopril     Cough   . Mercaptopurine     Neutropenia, pancreatitis  . Remicade [Infliximab] Other (See Comments)    Couldn't breath  . Codeine Rash    Current Outpatient Medications  Medication Sig Dispense Refill  . acetaminophen (TYLENOL) 650 MG CR tablet Take 1 tablet (650 mg total) by mouth every 8 (eight) hours as needed for pain.    Marland Kitchen amLODipine (NORVASC) 10 MG tablet Take 10 mg by mouth daily.    . calcium carbonate (OS-CAL - DOSED IN MG OF ELEMENTAL CALCIUM) 1250 (500 Ca) MG tablet Take 1 tablet by mouth 2 (two) times daily with a meal.    . cyanocobalamin (,VITAMIN  B-12,) 1000 MCG/ML injection Inject 1 mL into the muscle every 14 (fourteen) days.    . magnesium chloride (SLOW-MAG) 64 MG TBEC SR tablet Take 1 tablet (64 mg total) by mouth 2 (two) times daily.    . metoprolol succinate (TOPROL-XL) 25 MG 24 hr tablet Take 25 mg by mouth daily.    Marland Kitchen omeprazole (PRILOSEC) 20 MG capsule Take 20 mg by mouth daily.    . Vitamin D, Ergocalciferol, (DRISDOL) 50000 units CAPS capsule Take 50,000 Units by mouth every 7 (seven) days.    . [facility]S D3 5000 units capsule TAKE 1 CAPSULE (5,000 UNITS TOTAL) BY MOUTH DAILY (Patient not taking: Reported  on 01/03/2018)     Review of Systems PER HPI OTHERWISE ALL SYSTEMS ARE NEGATIVE.    Objective:   Physical Exam  Constitutional: She is oriented to person, place, and time. She appears well-developed and well-nourished. No distress.  HENT:  Head: Normocephalic and atraumatic.  Mouth/Throat: Oropharynx is clear and moist. No oropharyngeal exudate.  Eyes: Pupils are equal, round, and reactive to light. No scleral icterus.  Neck: Normal range of motion. Neck supple.  Cardiovascular: Normal rate, regular rhythm and normal heart sounds.  Pulmonary/Chest: Effort normal and breath sounds normal. No respiratory distress.  Abdominal: Soft. Bowel sounds are normal. She exhibits no distension. There is no tenderness.  Musculoskeletal: She exhibits no edema.  Lymphadenopathy:    She has no cervical adenopathy.  Neurological: She is alert and oriented to person, place, and time.  NO FOCAL DEFICITS  Psychiatric:  FLAT AFFECT, SLIGHTLY  DEPRESSED MOOD   Vitals reviewed.     Assessment & Plan:

## 2018-01-04 NOTE — Progress Notes (Signed)
CC'D TO PCP °

## 2018-01-04 NOTE — Progress Notes (Signed)
ON RECALL  °

## 2018-01-17 LAB — FECAL LACTOFERRIN, QUANT
Fecal Lactoferrin: POSITIVE — AB
MICRO NUMBER: 90790122
SPECIMEN QUALITY:: ADEQUATE

## 2018-01-17 LAB — GIARDIA ANTIGEN
MICRO NUMBER: 90790015
RESULT: NOT DETECTED
SPECIMEN QUALITY: ADEQUATE

## 2018-01-22 NOTE — Progress Notes (Signed)
Tamara Todd, please review and advise in the absence of Dr. Oneida Alar.

## 2018-01-23 ENCOUNTER — Ambulatory Visit (HOSPITAL_COMMUNITY)
Admission: RE | Admit: 2018-01-23 | Discharge: 2018-01-23 | Disposition: A | Discharge status: Home / Self Care | Payer: Medicare Other | Source: Ambulatory Visit | Attending: Gastroenterology | Admitting: Gastroenterology

## 2018-01-23 DIAGNOSIS — K5730 Diverticulosis of large intestine without perforation or abscess without bleeding: Secondary | ICD-10-CM | POA: Diagnosis not present

## 2018-01-23 DIAGNOSIS — N200 Calculus of kidney: Secondary | ICD-10-CM | POA: Diagnosis not present

## 2018-01-23 DIAGNOSIS — R1033 Periumbilical pain: Secondary | ICD-10-CM | POA: Diagnosis not present

## 2018-01-23 DIAGNOSIS — Z9049 Acquired absence of other specified parts of digestive tract: Secondary | ICD-10-CM | POA: Insufficient documentation

## 2018-01-23 DIAGNOSIS — I700 Atherosclerosis of aorta: Secondary | ICD-10-CM | POA: Diagnosis not present

## 2018-01-23 LAB — POCT I-STAT CREATININE: Creatinine, Ser: 0.5 mg/dL (ref 0.44–1.00)

## 2018-01-23 MED ORDER — IOPAMIDOL (ISOVUE-300) INJECTION 61%
100.0000 mL | Freq: Once | INTRAVENOUS | Status: AC | PRN
  Administered 2018-01-23: 100 mL via INTRAVENOUS

## 2018-01-25 NOTE — Progress Notes (Signed)
The C diff lab was entered for [facility], so it did not show up at [facility]. I called pt and she said she is not having any diarrhea at this time. She said that most of the time if she has any, it is just for one day. She will call me if she has a problem with diarrhea.

## 2018-01-25 NOTE — Progress Notes (Signed)
She did not complete Cdiff stool studies. She needs to complete this. Fecal lactoferrin is positive, which is non-specific. It tells [place] there are leukocytes and some type of inflammation that could be with acute infections or IBD. The good news is, here CT showed no active IBD. No diverticulitis. Needs to complete Cdiff. Further recommendations per Dr. Oneida Alar. No change in plan right now.

## 2018-01-30 NOTE — Progress Notes (Signed)
Pt is aware.  

## 2018-02-01 NOTE — Progress Notes (Signed)
Pt is aware.  

## 2018-03-07 ENCOUNTER — Encounter: Payer: Self-pay | Admitting: Gastroenterology

## 2018-05-30 ENCOUNTER — Ambulatory Visit: Payer: Medicare Other | Admitting: Gastroenterology

## 2018-05-30 ENCOUNTER — Encounter: Payer: Self-pay | Admitting: Gastroenterology

## 2018-05-30 DIAGNOSIS — K5000 Crohn's disease of small intestine without complications: Secondary | ICD-10-CM | POA: Diagnosis not present

## 2018-05-30 DIAGNOSIS — E559 Vitamin D deficiency, unspecified: Secondary | ICD-10-CM

## 2018-05-30 NOTE — Progress Notes (Signed)
Subjective:    Patient ID: Tamara Todd, female    DOB: [birth year], 77 y.o.   MRN: [id number]  Tamara Todd   HPI BOWELS SOME DAYS GOOD AND SOME DAYS BAD. TRIGGERS: MILK, SODA, OJ. THEY GIVE HER DIARRHEA AND SHE FEELS BAD. VEGETABLES DON'T BOTHER HER. GOT FLU SHOT. HAS MOLES GROWING ON HER RIGHT SIDE. BMs: PRETTY GOOD(#3), BAD DAY:#6-7. BAD DAYS PER MO: 15. JOINT PAIN: KNEES/SHOULDERS & BACK PAIN LEFT SIDE DUE TO ARTHRITIS. MAY TAKE TYLENOL FOR ABDOMINAL PAIN. CAN'T SLEEP. GOES TO BED AT 9 PM AND THEN AT 2-3 AM.  PT DENIES FEVER, CHILLS, HEMATOCHEZIA, HEMATEMESIS, nausea, vomiting, melena, SORES IN MOUTH, RASH ON LEGS, CHEST PAIN, SHORTNESS OF BREATH, CHANGE IN BOWEL IN HABITS, constipation, problems swallowing, OR heartburn or indigestion.  Past Medical History:  Diagnosis Date  . Crohn's disease ([place])    used to be followed by Dr. Algis Greenhouse. diagnosis in her 58s. multiple surgeries. remicade allergy.  . Diabetes ([place])   . GERD (gastroesophageal reflux disease)   . HTN (hypertension)   . Hyperlipidemia   . Hypertension   . Osteoporosis       . Pancreatitis    09/2015    Past Surgical History:  Procedure Laterality Date  . BACTERIAL OVERGROWTH TEST N/A 02/24/2016   POSITIVE for small bowel bacterial overgrowth   . BIOPSY  02/10/2016   Procedure: BIOPSY;  Surgeon: Danie Binder, MD;  [facility];  Service: Endoscopy;;  Random colon biopsies Gastric biopsies for h pylori gastric polyp biopsies  . BOWEL RESECTION     TWICE for stricture, including right colectomy with terminal ileal resection  . COLONOSCOPY    . COLONOSCOPY N/A 02/10/2016   Dr. Oneida Alar: normal neo terminal ileum, diverticula in sigmoid colon, non-bleeding internal hemorrhoids, adenomatous rectal polyp.   . ESOPHAGOGASTRODUODENOSCOPY N/A 02/10/2016   Dr. Oneida Alar: chronic gastritis, small hiatal hernia, few benign-appearing gastric polyps, no source for weight loss.   Marland Kitchen PARTIAL HYSTERECTOMY    .  POLYPECTOMY  02/10/2016   Procedure: POLYPECTOMY;  Surgeon: Danie Binder, MD;  [facility];  Service: Endoscopy;;  Rectal polyps x 2 removed 1 by hot snare and number 2 via cold forceps  . RECTOVAGINAL FISTULA CLOSURE  2004   with temporary colostomy and subsequent take down, all at [facility].    Allergies  Allergen Reactions  . Lisinopril     Cough   . Mercaptopurine     Neutropenia, pancreatitis  . Remicade [Infliximab] Other (See Comments)    Couldn't breath  . Codeine Rash    Current Outpatient Medications  Medication Sig    . acetaminophen (TYLENOL) 650 MG CR tablet Take 1 tablet (650 mg total) by mouth every 8 (eight) hours as needed for pain.    Marland Kitchen amLODipine (NORVASC) 10 MG tablet Take 10 mg by mouth daily.    . calcium carbonate (OS-CAL - DOSED IN MG OF ELEMENTAL CALCIUM) 1250 (500 Ca) MG tablet Take 1 tablet by mouth 2 (two) times daily with a meal.    . cyanocobalamin (,VITAMIN B-12,) 1000 MCG/ML injection Inject 1 mL into the muscle every 14 (fourteen) days.    . Hyoscyamine Sulfate SL (LEVSIN/SL) 0.125 MG SUBL 1-2 sl 30 minutes prior to meals tid and at bedtime WHEN YOU HAVE DIARRHEA. No more than 8 pills daily. USES WITH FLARES   . magnesium chloride (SLOW-MAG) 64 MG TBEC SR tablet Take 1 tablet (64 mg total) by mouth 2 (  two) times daily.    . metoprolol succinate (TOPROL-XL) 25 MG 24 hr tablet Take 25 mg by mouth daily.    Marland Kitchen omeprazole (PRILOSEC) 20 MG capsule Take 20 mg by mouth daily.    . Vitamin D, Ergocalciferol, (DRISDOL) 50000 units CAPS capsule Take 50,000 Units by mouth every 7 (seven) days.    .       Review of Systems PER HPI OTHERWISE ALL SYSTEMS ARE NEGATIVE.    Objective:   Physical Exam  Constitutional: She is oriented to person, place, and time. She appears well-developed and well-nourished. No distress.  HENT:  Head: Normocephalic and atraumatic.  Mouth/Throat: Oropharynx is clear and moist. No oropharyngeal exudate.  Eyes: Pupils are  equal, round, and reactive to light. No scleral icterus.  Neck: Normal range of motion. Neck supple.  Cardiovascular: Normal rate, regular rhythm and normal heart sounds.  Pulmonary/Chest: Effort normal and breath sounds normal. No respiratory distress.  Abdominal: Soft. Bowel sounds are normal. She exhibits no distension. There is no tenderness.  Musculoskeletal: She exhibits no edema.  Lymphadenopathy:    She has no cervical adenopathy.  Neurological: She is alert and oriented to person, place, and time.  NO  NEW FOCAL DEFICITS  Psychiatric: She has a normal mood and affect.  Vitals reviewed.     Assessment & Plan:

## 2018-05-30 NOTE — Assessment & Plan Note (Signed)
LAST BONE DENSITY JUL 2018: OSTEOPOROSIS, FEMUR -4.1  CHECK VIT D AND REFER TO ENDOCRINOLOGY. FOLLOW UP IN 6 MOS.

## 2018-05-30 NOTE — Assessment & Plan Note (Addendum)
SYMPTOMS FAIRLY WELL CONTROLLED WITHOUT MEDS AND FLARES WHEN SHE EATS CERTAIN THINGS.  CONTINUE TO MONITOR SYMPTOMS. IF YOU CONSUME DAIRY, ADD LACTASE 3 PILLS WITH MEALS UP TO THREE TIMES A DAY. COMPLETE THE PNEUMONIA SHOT if you have not had one.  COMPLETE BLOOD DRAW FOR: VIT D 25-OH, CBC, AND CMP. REFER TO ENDOCRINOLOGY FOR OSTEOPOROSIS. PLEASE CALL WITH QUESTIONS OR CONCERNS.  FOLLOW UP IN 4 MOS.

## 2018-05-30 NOTE — Patient Instructions (Addendum)
IF YOU CONSUME DAIRY, ADD LACTASE 3 PILLS WITH MEALS UP TO THREE TIMES A DAY.   COMPLETE THE PNEUMONIA SHOT if you have not had one.   COMPLETE BLOOD DRAW FOR: VIT D 25-OH, CBC, AND CMP.    SEE ENDOCRINOLOGY FOR OSTEOPOROSIS.  PLEASE CALL WITH QUESTIONS OR CONCERNS.  FOLLOW UP IN 4 MOS.

## 2018-05-30 NOTE — Assessment & Plan Note (Signed)
LAST LEVEL 4196(22) LOW IN PT WITH PSTEOPOROSIS.  RECHECK VITAMIN D LEVEL. FOLLOW UP IN 6 MOS.

## 2018-05-30 NOTE — Progress Notes (Signed)
cc'ed to pcp °

## 2018-05-30 NOTE — Progress Notes (Signed)
On recall  °

## 2018-05-31 ENCOUNTER — Telehealth: Payer: Self-pay | Admitting: Gastroenterology

## 2018-05-31 LAB — COMPLETE METABOLIC PANEL WITH GFR
AG Ratio: 1.1 (calc) (ref 1.0–2.5)
ALT: 16 U/L (ref 6–29)
AST: 22 U/L (ref 10–35)
Albumin: 4 g/dL (ref 3.6–5.1)
Alkaline phosphatase (APISO): 70 U/L (ref 33–130)
BILIRUBIN TOTAL: 0.3 mg/dL (ref 0.2–1.2)
BUN: 15 mg/dL (ref 7–25)
CHLORIDE: 104 mmol/L (ref 98–110)
CO2: 31 mmol/L (ref 20–32)
CREATININE: 0.62 mg/dL (ref 0.60–0.93)
Calcium: 10 mg/dL (ref 8.6–10.4)
GFR, EST AFRICAN AMERICAN: 102 mL/min/{1.73_m2} (ref >=60)
GFR, Est Non African American: 88 mL/min/{1.73_m2} (ref >=60)
GLUCOSE: 92 mg/dL (ref 65–139)
Globulin: 3.7 g/dL (calc) (ref 1.9–3.7)
Potassium: 4 mmol/L (ref 3.5–5.3)
Sodium: 141 mmol/L (ref 135–146)
Total Protein: 7.7 g/dL (ref 6.1–8.1)

## 2018-05-31 LAB — VITAMIN D 25 HYDROXY (VIT D DEFICIENCY, FRACTURES): VIT D 25 HYDROXY: 28 ng/mL — AB (ref 30–100)

## 2018-05-31 LAB — CBC WITH DIFFERENTIAL/PLATELET
BASOS ABS: 29 {cells}/uL (ref 0–200)
BASOS PCT: 0.6 %
EOS ABS: 139 {cells}/uL (ref 15–500)
Eosinophils Relative: 2.9 %
HCT: 37.9 % (ref 35.0–45.0)
Hemoglobin: 12.5 g/dL (ref 11.7–15.5)
Lymphs Abs: 1930 cells/uL (ref 850–3900)
MCH: 31.9 pg (ref 27.0–33.0)
MCHC: 33 g/dL (ref 32.0–36.0)
MCV: 96.7 fL (ref 80.0–100.0)
MONOS PCT: 6.2 %
MPV: 10 fL (ref 7.5–12.5)
NEUTROS PCT: 50.1 %
Neutro Abs: 2405 cells/uL (ref 1500–7800)
PLATELETS: 221 10*3/uL (ref 140–400)
RBC: 3.92 10*6/uL (ref 3.80–5.10)
RDW: 13.1 % (ref 11.0–15.0)
TOTAL LYMPHOCYTE: 40.2 %
WBC mixed population: 298 cells/uL (ref 200–950)
WBC: 4.8 10*3/uL (ref 3.8–10.8)

## 2018-05-31 MED ORDER — VITAMIN D (ERGOCALCIFEROL) 1.25 MG (50000 UNIT) PO CAPS: 50000.0000 [IU] | ORAL_CAPSULE | ORAL | 0 refills | Status: DC

## 2018-05-31 MED ORDER — CHOLECALCIFEROL 125 MCG (5000 UT) PO CAPS: ORAL_CAPSULE | ORAL | 3 refills | Status: DC

## 2018-05-31 NOTE — Telephone Encounter (Signed)
PLEASE CALL PT. HER VITAMIN D LEVEL IS LOW. SHE NEEDS SUPPLEMENTS: VITAMIN D AND CALCIUM. SHE NEEDS VITAMIN D 50,000 UNITS A WEEK FOR 8 WEEKS THEN RESTART VITAMIN D 5000 UNITS DAILY FOREVER. SHE NEEDS CALCIUM 500 MG THREE TIMES A DAY FOREVER.   HER LIVER PANEL AND BLOOD COUNTS ARE NORMAL.

## 2018-06-01 NOTE — Telephone Encounter (Signed)
PT is aware.

## 2018-07-12 ENCOUNTER — Other Ambulatory Visit: Payer: Self-pay | Admitting: "Endocrinology

## 2018-07-12 DIAGNOSIS — I10 Essential (primary) hypertension: Secondary | ICD-10-CM

## 2018-07-12 DIAGNOSIS — E559 Vitamin D deficiency, unspecified: Secondary | ICD-10-CM

## 2018-07-12 DIAGNOSIS — E1165 Type 2 diabetes mellitus with hyperglycemia: Secondary | ICD-10-CM

## 2018-07-13 LAB — COMPREHENSIVE METABOLIC PANEL
AG Ratio: 1.1 (calc) (ref 1.0–2.5)
ALKALINE PHOSPHATASE (APISO): 69 U/L (ref 33–130)
ALT: 15 U/L (ref 6–29)
AST: 20 U/L (ref 10–35)
Albumin: 3.8 g/dL (ref 3.6–5.1)
BILIRUBIN TOTAL: 0.3 mg/dL (ref 0.2–1.2)
BUN: 19 mg/dL (ref 7–25)
CALCIUM: 9.9 mg/dL (ref 8.6–10.4)
CHLORIDE: 105 mmol/L (ref 98–110)
CO2: 31 mmol/L (ref 20–32)
CREATININE: 0.64 mg/dL (ref 0.60–0.93)
GLOBULIN: 3.4 g/dL (ref 1.9–3.7)
Glucose, Bld: 77 mg/dL (ref 65–139)
Potassium: 3.8 mmol/L (ref 3.5–5.3)
Sodium: 142 mmol/L (ref 135–146)
Total Protein: 7.2 g/dL (ref 6.1–8.1)

## 2018-07-13 LAB — MAGNESIUM: MAGNESIUM: 1.6 mg/dL (ref 1.5–2.5)

## 2018-07-13 LAB — PHOSPHORUS: PHOSPHORUS: 2.9 mg/dL (ref 2.1–4.3)

## 2018-07-13 LAB — VITAMIN D 25 HYDROXY (VIT D DEFICIENCY, FRACTURES): VIT D 25 HYDROXY: 32 ng/mL (ref 30–100)

## 2018-07-13 LAB — HEMOGLOBIN A1C
HEMOGLOBIN A1C: 5.8 %{Hb} — AB (ref <5.7)
Mean Plasma Glucose: 120 (calc)
eAG (mmol/L): 6.6 (calc)

## 2018-07-16 ENCOUNTER — Ambulatory Visit (INDEPENDENT_AMBULATORY_CARE_PROVIDER_SITE_OTHER): Payer: Medicare Other | Admitting: "Endocrinology

## 2018-07-16 ENCOUNTER — Encounter: Payer: Self-pay | Admitting: "Endocrinology

## 2018-07-16 VITALS — BP 139/75 | HR 77 | Ht 59.0 in | Wt 126.0 lb

## 2018-07-16 DIAGNOSIS — I10 Essential (primary) hypertension: Secondary | ICD-10-CM

## 2018-07-16 DIAGNOSIS — E119 Type 2 diabetes mellitus without complications: Secondary | ICD-10-CM

## 2018-07-16 NOTE — Progress Notes (Signed)
Endocrinology follow-up note   Subjective:    Patient ID: Tamara Todd, female    DOB: Jun 24, 1941. Patient is being seen in  F/u  for management of diabetes requested by  [name]  Past Medical History:  Diagnosis Date  . Crohn's disease ([place])    used to be followed by Dr. Algis Greenhouse. diagnosis in her 78s. multiple surgeries. remicade allergy.  . Diabetes ([place])   . GERD (gastroesophageal reflux disease)   . HTN (hypertension)   . Hyperlipidemia   . Hypertension   . Osteoporosis       . Pancreatitis    09/2015   Past Surgical History:  Procedure Laterality Date  . BACTERIAL OVERGROWTH TEST N/A 02/24/2016   POSITIVE for small bowel bacterial overgrowth   . BIOPSY  02/10/2016   Procedure: BIOPSY;  Surgeon: Danie Binder, MD;  [facility];  Service: Endoscopy;;  Random colon biopsies Gastric biopsies for h pylori gastric polyp biopsies  . BOWEL RESECTION     TWICE for stricture, including right colectomy with terminal ileal resection  . COLONOSCOPY    . COLONOSCOPY N/A 02/10/2016   Dr. Oneida Alar: normal neo terminal ileum, diverticula in sigmoid colon, non-bleeding internal hemorrhoids, adenomatous rectal polyp.   . ESOPHAGOGASTRODUODENOSCOPY N/A 02/10/2016   Dr. Oneida Alar: chronic gastritis, small hiatal hernia, few benign-appearing gastric polyps, no source for weight loss.   Marland Kitchen PARTIAL HYSTERECTOMY    . POLYPECTOMY  02/10/2016   Procedure: POLYPECTOMY;  Surgeon: Danie Binder, MD;  [facility];  Service: Endoscopy;;  Rectal polyps x 2 removed 1 by hot snare and number 2 via cold forceps  . RECTOVAGINAL FISTULA CLOSURE  2004   with temporary colostomy and subsequent take down, all at [facility].    Social History   Socioeconomic History  . Marital status: Married    Spouse name: Not on file  . Number of children: 1  . Years of education: Not on file  . Highest education level: Not on file  Occupational History  . Occupation: retired  Scientific laboratory technician   . Financial resource strain: Not on file  . Food insecurity:    Worry: Not on file    Inability: Not on file  . Transportation needs:    Medical: Not on file    Non-medical: Not on file  Tobacco Use  . Smoking status: Never Smoker  . Smokeless tobacco: Never Used  Substance and Sexual Activity  . Alcohol use: No    Alcohol/week: 0.0 standard drinks  . Drug use: No  . Sexual activity: Not on file  Lifestyle  . Physical activity:    Days per week: Not on file    Minutes per session: Not on file  . Stress: Not on file  Relationships  . Social connections:    Talks on phone: Not on file    Gets together: Not on file    Attends religious service: Not on file    Active member of club or organization: Not on file    Attends meetings of clubs or organizations: Not on file    Relationship status: Not on file  Other Topics Concern  . Not on file  Social History Narrative  . Not on file   Outpatient Encounter Medications as of 07/16/2018  Medication Sig  . acetaminophen (TYLENOL) 650 MG CR tablet Take 1 tablet (650 mg total) by mouth every 8 (eight) hours as needed for pain.  Marland Kitchen amLODipine (NORVASC) 10 MG  tablet Take 10 mg by mouth daily.  . calcium carbonate (OS-CAL - DOSED IN MG OF ELEMENTAL CALCIUM) 1250 (500 Ca) MG tablet Take 1 tablet by mouth 2 (two) times daily with a meal.  . Cholecalciferol (CVS D3) 125 MCG (5000 UT) capsule TAKE 1 CAPSULE (5,000 UNITS TOTAL) BY MOUTH DAILY  . cyanocobalamin (,VITAMIN B-12,) 1000 MCG/ML injection Inject 1 mL into the muscle every 14 (fourteen) days.  . Hyoscyamine Sulfate SL (LEVSIN/SL) 0.125 MG SUBL 1-2 sl 30 minutes prior to meals tid and at bedtime WHEN YOU HAVE DIARRHEA. No more than 8 pills daily.  . magnesium chloride (SLOW-MAG) 64 MG TBEC SR tablet Take 1 tablet (64 mg total) by mouth 2 (two) times daily.  . metoprolol succinate (TOPROL-XL) 25 MG 24 hr tablet Take 25 mg by mouth daily.  Marland Kitchen omeprazole (PRILOSEC) 20 MG capsule Take 20 mg  by mouth daily.  . Vitamin D, Ergocalciferol, (DRISDOL) 1.25 MG (50000 UT) CAPS capsule Take 1 capsule (50,000 Units total) by mouth every 7 (seven) days.   No facility-administered encounter medications on file as of 07/16/2018.    ALLERGIES: Allergies  Allergen Reactions  . Lisinopril     Cough   . Mercaptopurine     Neutropenia, pancreatitis  . Remicade [Infliximab] Other (See Comments)    Couldn't breath  . Codeine Rash   VACCINATION STATUS:  There is no immunization history on file for this patient.  Diabetes  She presents for her follow-up diabetic visit. She has type 2 diabetes mellitus. Onset time: She was diagnosed at approximate age of 37 years . Her disease course has been stable. There are no hypoglycemic associated symptoms. Pertinent negatives for hypoglycemia include no confusion, headaches, pallor or seizures. There are no diabetic associated symptoms. Pertinent negatives for diabetes include no chest pain, no polydipsia, no polyphagia and no polyuria. There are no hypoglycemic complications. Symptoms are stable. There are no diabetic complications. Risk factors for coronary artery disease include diabetes mellitus and sedentary lifestyle. When asked about current treatments, none were reported. She is compliant with treatment most of the time. Her weight is increasing steadily. She is following a generally unhealthy diet. When asked about meal planning, she reported none. She has not had a previous visit with a dietitian. She rarely participates in exercise. An ACE inhibitor/angiotensin II receptor blocker is contraindicated (She is allergic to ACE inhibitor's.).    Review of Systems  Constitutional: Negative for chills, fever and unexpected weight change.  HENT: Negative for trouble swallowing and voice change.   Eyes: Negative for visual disturbance.  Respiratory: Negative for cough, shortness of breath and wheezing.   Cardiovascular: Negative for chest pain,  palpitations and leg swelling.  Gastrointestinal: Negative for diarrhea, nausea and vomiting.  Endocrine: Negative for cold intolerance, heat intolerance, polydipsia, polyphagia and polyuria.  Musculoskeletal: Negative for arthralgias and myalgias.  Skin: Negative for color change, pallor, rash and wound.  Neurological: Negative for seizures and headaches.  Psychiatric/Behavioral: Negative for confusion and suicidal ideas.    Objective:    BP 139/75   Pulse 77   Ht 4' 11"  (1.499 m)   Wt 126 lb (57.2 kg)   BMI 25.45 kg/m   Wt Readings from Last 3 Encounters:  07/16/18 126 lb (57.2 kg)  05/30/18 123 lb 9.6 oz (56.1 kg)  01/03/18 120 lb 3.2 oz (54.5 kg)    Physical Exam  Constitutional: She is oriented to person, place, and time.  Light build.  HENT:  Head:  Normocephalic and atraumatic.  Eyes: EOM are normal.  Neck: Normal range of motion. Neck supple. No tracheal deviation present. No thyromegaly present.  Cardiovascular: Normal rate and regular rhythm.  Pulmonary/Chest: Effort normal and breath sounds normal.  Abdominal: Soft. Bowel sounds are normal. There is no abdominal tenderness. There is no guarding.  Musculoskeletal: Normal range of motion.        General: No edema.  Neurological: She is alert and oriented to person, place, and time. She has normal reflexes. No cranial nerve deficit. Coordination normal.  Skin: Skin is warm and dry. No rash noted. No erythema. No pallor.  Psychiatric: She has a normal mood and affect. Judgment normal.     CMP ( most recent) CMP     Component Value Date/Time   NA 142 07/12/2018 1202   K 3.8 07/12/2018 1202   CL 105 07/12/2018 1202   CO2 31 07/12/2018 1202   GLUCOSE 77 07/12/2018 1202   BUN 19 07/12/2018 1202   CREATININE 0.64 07/12/2018 1202   CALCIUM 9.9 07/12/2018 1202   PROT 7.2 07/12/2018 1202   ALBUMIN 3.5 (L) 12/23/2016 1148   AST 20 07/12/2018 1202   ALT 15 07/12/2018 1202   ALKPHOS 99 12/23/2016 1148   BILITOT 0.3  07/12/2018 1202   GFRNONAA 88 05/30/2018 1450   GFRAA 102 05/30/2018 1450     Diabetic Labs (most recent): Lab Results  Component Value Date   HGBA1C 5.8 (H) 07/12/2018   HGBA1C 5.7 (H) 07/06/2017   HGBA1C 5.5 12/23/2016     Assessment & Plan:   1. controlled type 2 diabetes mellitus with complication, without long-term current use of insulin ([place])  - Patient has currently controlled asymptomatic type 2 DM since  77 years of age. -She presents with stable A1c of 5.8%.  She is not on any antidiabetic medications currently.  Recent labs reviewed.  -I discussed with the patient the need to avoid acute and chronic complications of diabetes which include CAD, CVA, CKD, retinopathy, and neuropathy.  - I have counseled the patient on diet management  by adopting a carbohydrate restricted/protein rich diet.  - I encouraged the patient to switch to  unprocessed or minimally processed complex starch and increased protein intake (animal or plant source), fruits, and vegetables.  - Patient is advised to stick to a routine mealtimes to eat 3 meals  a day and  some snacks if necessary.  - I have approached patient with the following individualized plan to manage diabetes and patient agrees:     - At this time, anti-diabetes therapy is unnecessary. - If therapy is necessary in her case, insulin would be a better choice for her. - Patient is not a good candidate for incretin therapy, metformin, nor SGLT2 inhibitors.  - Patient specific target  A1c;  LDL, HDL, Triglycerides, and  Waist Circumference were discussed in detail.  2) BP/HTN: uncontrolled. she reports allergies to ACE inhibitors. She is on amlodipine 10 mg by mouth daily and metoprolol 25 mg every day. I advised her to be consistent on this medications. 3) Lipids/HPL:controlled.  4)  Weight/Diet:  she is light build. She is advised to increase her intake of more complex carbohydrates including rice, past, and bread.  5) vitamin  D deficiency: She is advised to finish her current supplies of vitamin D2 50,000 units weekly, will have repeat 25-hydroxy vitamin D level before her next visit in 6 months.    6) Chronic Care/Health Maintenance:  -Patient is not on  ACEI/ARB and Statin medications and encouraged to continue to follow up with Ophthalmology, Podiatrist at least yearly or according to recommendations, and advised to   stay away from smoking. I have recommended yearly flu vaccine and pneumonia vaccination at least every 5 years;  and  sleep for at least 7 hours a day.  - I advised patient to maintain close follow up with [name] for primary care needs.  Follow up plan: - Return in about 6 months (around 01/15/2019) for Follow up with Pre-visit Labs.  Glade Lloyd, MD Phone: [phone]  Fax: [phone]  -  This note was partially dictated with voice recognition software. Similar sounding words can be transcribed inadequately or may not  be corrected upon review.  07/16/2018, 1:25 PM

## 2018-08-09 ENCOUNTER — Encounter: Payer: Self-pay | Admitting: Gastroenterology

## 2018-12-19 ENCOUNTER — Ambulatory Visit (INDEPENDENT_AMBULATORY_CARE_PROVIDER_SITE_OTHER): Payer: Medicare Other | Admitting: Gastroenterology

## 2018-12-19 ENCOUNTER — Encounter: Payer: Self-pay | Admitting: Gastroenterology

## 2018-12-19 ENCOUNTER — Other Ambulatory Visit: Payer: Self-pay

## 2018-12-19 DIAGNOSIS — K50018 Crohn's disease of small intestine with other complication: Secondary | ICD-10-CM

## 2018-12-19 DIAGNOSIS — R140 Abdominal distension (gaseous): Secondary | ICD-10-CM | POA: Insufficient documentation

## 2018-12-19 DIAGNOSIS — E559 Vitamin D deficiency, unspecified: Secondary | ICD-10-CM

## 2018-12-19 MED ORDER — HYOSCYAMINE SULFATE SL 0.125 MG SL SUBL: SUBLINGUAL_TABLET | SUBLINGUAL | 11 refills | Status: DC

## 2018-12-19 MED ORDER — CIPROFLOXACIN HCL 500 MG PO TABS: ORAL_TABLET | ORAL | 0 refills | Status: DC

## 2018-12-19 MED ORDER — METRONIDAZOLE 500 MG PO TABS: 500.0000 mg | ORAL_TABLET | Freq: Two times a day (BID) | ORAL | 0 refills | Status: DC

## 2018-12-19 NOTE — Assessment & Plan Note (Addendum)
SYMPTOMS FAIRLY WELL CONTROLLED. NO EVIDENCE FOR ACTIVE DISEASE, IN SURGICAL REMISSION.  DRINK WATER TO KEEP YOUR URINE LIGHT YELLOW. USE HYOSCYAMINE WHEN NEEDED TO CONTROL ABDOMINAL PAIN OR DIARRHEA. REFILL x 1 YEAR. PLEASE CALL WITH QUESTIONS OR CONCERNS. FOLLOW UP IN 6 MOS. WE NEED TO CHECK YOUR BLOOD COUNT, VITAMIN B12 AND VITAMIN D LEVEL ONE WEEK PRIOR TO YOUR NEXT VISIT.

## 2018-12-19 NOTE — Progress Notes (Addendum)
Subjective:    Patient ID: Tamara Todd, female    DOB: 1940-10-22, 78 y.o.   MRN: [id number]  Primary Care Physician:  [name]  Primary GI:  Barney Drain, MD   Patient Location: home   Provider Location: [facility] office   Reason for Visit: Crohn's disease   Persons present on the virtual encounter, with roles: patient, myself (provider), MARTINA BOOTH CMA (update meds/allergies)   Total time (minutes) spent on medical discussion:  18 MINUTES   Due to COVID-19, visit was VIA TELEPHONE VISIT DUE TO COVID 19. VISIT IS CONDUCTED VIRTUALLY AND WAS REQUESTED BY PATIENT.   Virtual Visit via TELEPHONE   I connected with Reylene Grulke and verified that I am speaking with the correct person using two identifiers.   I discussed the limitations, risks, security and privacy concerns of performing an evaluation and management service by telephone/video and the availability of in person appointments. I also discussed with the patient that there may be a patient responsible charge related to this service. The patient expressed understanding and agreed to proceed.   HPI DOING FAIRLY WELL ABOUT AS WELL AS LAST TIME. LAST TIME SHE WEIGHED WAS 126 LBS. HAS A COUGH AT NIGHT. WAS REAL BAD. IT'S A LITTLE BETTER THIS WEEK. HAS FLATULENCE A LOT. WOULD LIKE MORE PILLS FOR ABDOMINAL PAIN. APPETITE: PRETTY GOOD. BMs: 2-3X/DAY AND THEN < 1X/DAY(NL). FEELS BLOATED. FIRST OF MAY BOWELS WERE BLACK AFTER PEPTO BISMOL. LAST TIME HAD DIARRHEA: LAST WEEK.   PT DENIES FEVER, CHILLS, HEMATOCHEZIA, HEMATEMESIS, nausea, vomiting, melena, CHEST PAIN, SHORTNESS OF BREATH, CHANGE IN BOWEL IN HABITS, constipation, abdominal pain, problems swallowing, OR heartburn or indigestion.  Past Medical History:  Diagnosis Date  . Crohn's disease ([place])    used to be followed by Dr. Algis Greenhouse. diagnosis in her 40s. multiple surgeries. remicade allergy.  . Diabetes ([place])   . GERD (gastroesophageal reflux disease)   . HTN  (hypertension)   . Hyperlipidemia   . Hypertension   . Osteoporosis       . Pancreatitis    09/2015    Past Surgical History:  Procedure Laterality Date  . BACTERIAL OVERGROWTH TEST N/A 02/24/2016   POSITIVE for small bowel bacterial overgrowth   . BIOPSY  02/10/2016   Procedure: BIOPSY;  Surgeon: Danie Binder, MD;  [facility];  Service: Endoscopy;;  Random colon biopsies Gastric biopsies for h pylori gastric polyp biopsies  . BOWEL RESECTION     TWICE for stricture, including right colectomy with terminal ileal resection  . COLONOSCOPY    . COLONOSCOPY N/A 02/10/2016   Dr. Oneida Alar: normal neo terminal ileum, diverticula in sigmoid colon, non-bleeding internal hemorrhoids, adenomatous rectal polyp.   . ESOPHAGOGASTRODUODENOSCOPY N/A 02/10/2016   Dr. Oneida Alar: chronic gastritis, small hiatal hernia, few benign-appearing gastric polyps, no source for weight loss.   Marland Kitchen PARTIAL HYSTERECTOMY    . POLYPECTOMY  02/10/2016   Procedure: POLYPECTOMY;  Surgeon: Danie Binder, MD;  [facility];  Service: Endoscopy;;  Rectal polyps x 2 removed 1 by hot snare and number 2 via cold forceps  . RECTOVAGINAL FISTULA CLOSURE  2004   with temporary colostomy and subsequent take down, all at [facility].     Allergies  Allergen Reactions  . Lisinopril     Cough   . Mercaptopurine     Neutropenia, pancreatitis  . Remicade [Infliximab] Other (See Comments)    Couldn't breath  . Codeine Rash   Current Outpatient  Medications  Medication Sig    . acetaminophen (TYLENOL) 650 MG CR tablet Take 1 tablet (650 mg total) by mouth every 8 (eight) hours as needed for pain.    Marland Kitchen amLODipine (NORVASC) 10 MG tablet Take 10 mg by mouth daily.    . calcium carbonate (OS-CAL - DOSED IN MG OF ELEMENTAL CALCIUM) 1250 (500 Ca) MG tablet Take 1 tablet by mouth 2 (two) times daily with a meal.    . Cholecalciferol (CVS D3) 125 MCG (5000 UT) capsule TAKE 1 CAPSULE (5,000 UNITS TOTAL) BY MOUTH DAILY    .  cyanocobalamin (,VITAMIN B-12,) 1000 MCG/ML injection Inject 1 mL into the muscle every 14 (fourteen) days.    . Hyoscyamine Sulfate SL (LEVSIN/SL) 0.125 MG SUBL 1-2 sl 30 minutes prior to meals tid and at bedtime WHEN YOU HAVE DIARRHEA. No more than 8 pills daily.    . magnesium chloride (SLOW-MAG) 64 MG TBEC SR tablet Take 1 tablet (64 mg total) by mouth 2 (two) times daily.    . metoprolol succinate (TOPROL-XL) 25 MG 24 hr tablet Take 25 mg by mouth daily.    Marland Kitchen omeprazole (PRILOSEC) 20 MG capsule Take 20 mg by mouth daily.    . Vitamin D, Ergocalciferol, (DRISDOL) 1.25 MG (50000 UT) CAPS capsule Take 1 capsule (50,000 Units total) by mouth every 7 (seven) days.     Review of Systems PER HPI OTHERWISE ALL SYSTEMS ARE NEGATIVE.    Objective:   Physical Exam TELEPHONE VISIT DUE TO COVID 19, VISIT IS CONDUCTED VIRTUALLY AND WAS REQUESTED BY PATIENT.     Assessment & Plan:

## 2018-12-19 NOTE — Patient Instructions (Signed)
DRINK WATER TO KEEP YOUR URINE LIGHT YELLOW.  TAKE CIPRO AND FLAGYL TWICE DAILY FOR 5 DAYS. MEDICATION SIDE EFFECTS INCLUDE HEEL PAIN, NAUSEA, VOMITING. AVOID ALCOHOL AND COUGH SYRUP WITH ALCOHOL WHILE TAKING FLAGYL.  USE HYOSCYAMINE WHEN NEEDED TO CONTROL ABDOMINAL PAIN OR DIARRHEA.  CONTINUE OMEPRAZOLE.  TAKE 30 MINUTES PRIOR TO YOUR FIRST MEAL.  PLEASE CALL WITH QUESTIONS OR CONCERNS.  FOLLOW UP IN 6 MOS. WE NEED TO CHECK YOUR BLOOD COUNT, VITAMIN B12 AND VITAMIN D LEVEL ONE WEEK PRIOR TO YOUR NEXT VISIT.

## 2018-12-19 NOTE — Assessment & Plan Note (Signed)
LAST VITAMIN D LEVEL WNLs.  CONTINUE CALCIUM/VITAMIN D. PLEASE CALL WITH QUESTIONS OR CONCERNS. FOLLOW UP IN 6 MOS. WE NEED TO CHECK YOUR BLOOD COUNT, VITAMIN B12 AND VITAMIN D LEVEL ONE WEEK PRIOR TO YOUR NEXT VISIT.

## 2018-12-19 NOTE — Progress Notes (Signed)
ON RECALL  °

## 2018-12-19 NOTE — Assessment & Plan Note (Signed)
ASSOCIATED WITH FLATULENCE AND MAY BE DUE TO SMALL INTESTINE BACTERIAL OVERGROWTH. SYMPTOMS NOT IDEALLY CONTROLLED.   DRINK WATER TO KEEP YOUR URINE LIGHT YELLOW. TAKE CIPRO AND FLAGYL TWICE DAILY FOR 5 DAYS. MEDICATION SIDE EFFECTS INCLUDE HEEL PAIN, NAUSEA, VOMITING. AVOID ALCOHOL AND COUGH SYRUP WITH ALCOHOL WHILE TAKING FLAGYL. PLEASE CALL WITH QUESTIONS OR CONCERNS. FOLLOW UP IN 6 MOS.

## 2018-12-20 NOTE — Progress Notes (Signed)
CC'D TO PCP °

## 2019-01-14 ENCOUNTER — Ambulatory Visit: Payer: Medicare Other | Admitting: "Endocrinology

## 2019-01-21 ENCOUNTER — Telehealth: Payer: Self-pay | Admitting: Gastroenterology

## 2019-01-21 MED ORDER — HYOSCYAMINE SULFATE SL 0.125 MG SL SUBL: SUBLINGUAL_TABLET | SUBLINGUAL | 11 refills | Status: DC

## 2019-01-21 NOTE — Telephone Encounter (Signed)
Pt wanted to know if SF would call her something in for diarrhea. She uses [facility] in [place]. Please advise. [phone]

## 2019-01-21 NOTE — Telephone Encounter (Signed)
Pt notified of SLF's recommendations and will follow her instructions. Pt will call back with any questions or concerns.

## 2019-01-21 NOTE — Telephone Encounter (Signed)
PLEASE CALL PT. She should follow a LOW FAT/LACTOSE FREE DIET. CHEW ONE CALCIUM TABLET WITH MEALS THREE TIMES A DAY. INCREASE LEVISN 30 MINUTES PRIOR TO BREAKFAST,LUNCH, AND DINNER AND AT BEDTIME.  IT MAY CAUSE DROWSINESS, DRY EYES/MOUTH, BLURRY VISION, OR DIFFICULTY URINATING.  PLEASE CALL WITH QUESTIONS OR CONCERNS.

## 2019-01-21 NOTE — Telephone Encounter (Signed)
PT completed the Cipro and Flagyl in early June. She is still having diarrhea and it is especially bad right after she eats.  Some days it seems she has diarrhea almost all day and it is very watery.  She got the Rx for Levsin, but it was only for #30 tablets and she has almost finished that. She has only been taking 1-2 times a day and at bedtime some. Dr. Oneida Alar, please advise!

## 2019-02-25 ENCOUNTER — Ambulatory Visit: Payer: Medicare Other | Admitting: "Endocrinology

## 2019-02-27 ENCOUNTER — Other Ambulatory Visit: Payer: Self-pay | Admitting: "Endocrinology

## 2019-02-27 DIAGNOSIS — E559 Vitamin D deficiency, unspecified: Secondary | ICD-10-CM

## 2019-02-27 DIAGNOSIS — E119 Type 2 diabetes mellitus without complications: Secondary | ICD-10-CM

## 2019-02-28 LAB — COMPREHENSIVE METABOLIC PANEL
AG Ratio: 1 (calc) (ref 1.0–2.5)
ALT: 25 U/L (ref 6–29)
AST: 31 U/L (ref 10–35)
Albumin: 3.8 g/dL (ref 3.6–5.1)
Alkaline phosphatase (APISO): 69 U/L (ref 37–153)
BUN: 17 mg/dL (ref 7–25)
CO2: 25 mmol/L (ref 20–32)
Calcium: 9.9 mg/dL (ref 8.6–10.4)
Chloride: 104 mmol/L (ref 98–110)
Creat: 0.62 mg/dL (ref 0.60–0.93)
Globulin: 4 g/dL (calc) — ABNORMAL HIGH (ref 1.9–3.7)
Glucose, Bld: 93 mg/dL (ref 65–139)
Potassium: 3.3 mmol/L — ABNORMAL LOW (ref 3.5–5.3)
Sodium: 141 mmol/L (ref 135–146)
Total Bilirubin: 0.5 mg/dL (ref 0.2–1.2)
Total Protein: 7.8 g/dL (ref 6.1–8.1)

## 2019-02-28 LAB — HEMOGLOBIN A1C
Hgb A1c MFr Bld: 5.7 %{Hb} — ABNORMAL HIGH
Mean Plasma Glucose: 117 (calc)
eAG (mmol/L): 6.5 (calc)

## 2019-02-28 LAB — VITAMIN D 25 HYDROXY (VIT D DEFICIENCY, FRACTURES): Vit D, 25-Hydroxy: 35 ng/mL (ref 30–100)

## 2019-03-05 ENCOUNTER — Ambulatory Visit (INDEPENDENT_AMBULATORY_CARE_PROVIDER_SITE_OTHER): Payer: Medicare Other | Admitting: "Endocrinology

## 2019-03-05 ENCOUNTER — Encounter: Payer: Self-pay | Admitting: "Endocrinology

## 2019-03-05 ENCOUNTER — Other Ambulatory Visit: Payer: Self-pay

## 2019-03-05 DIAGNOSIS — E559 Vitamin D deficiency, unspecified: Secondary | ICD-10-CM

## 2019-03-05 DIAGNOSIS — E119 Type 2 diabetes mellitus without complications: Secondary | ICD-10-CM

## 2019-03-05 NOTE — Progress Notes (Addendum)
03/05/2019                                Endocrinology Telehealth Visit Follow up Note -During COVID -19 Pandemic  I connected with [place] Underdown on 03/05/2019   by telephone and verified that I am speaking with the correct person using two identifiers. Dianca Owensby Fornes, 1940-12-12. she has verbally consented to this visit. All issues noted in this document were discussed and addressed. The format was not optimal for physical exam.  Subjective:    Patient ID: Tamara Todd, female    DOB: Apr 03, 1941. Patient is being engaged in telehealth via telephone for management of currently controlled type 2 diabetes.   Past Medical History:  Diagnosis Date  . Crohn's disease ([place])    used to be followed by Dr. Algis Greenhouse. diagnosis in her 30s. multiple surgeries. remicade allergy.  . Diabetes ([place])   . GERD (gastroesophageal reflux disease)   . HTN (hypertension)   . Hyperlipidemia   . Hypertension   . Osteoporosis       . Pancreatitis    09/2015   Past Surgical History:  Procedure Laterality Date  . BACTERIAL OVERGROWTH TEST N/A 02/24/2016   POSITIVE for small bowel bacterial overgrowth   . BIOPSY  02/10/2016   Procedure: BIOPSY;  Surgeon: Danie Binder, MD;  [facility];  Service: Endoscopy;;  Random colon biopsies Gastric biopsies for h pylori gastric polyp biopsies  . BOWEL RESECTION     TWICE for stricture, including right colectomy with terminal ileal resection  . COLONOSCOPY    . COLONOSCOPY N/A 02/10/2016   Dr. Oneida Alar: normal neo terminal ileum, diverticula in sigmoid colon, non-bleeding internal hemorrhoids, adenomatous rectal polyp.   . ESOPHAGOGASTRODUODENOSCOPY N/A 02/10/2016   Dr. Oneida Alar: chronic gastritis, small hiatal hernia, few benign-appearing gastric polyps, no source for weight loss.   Marland Kitchen PARTIAL HYSTERECTOMY    . POLYPECTOMY  02/10/2016   Procedure: POLYPECTOMY;  Surgeon: Danie Binder, MD;  [facility];  Service: Endoscopy;;  Rectal polyps x 2  removed 1 by hot snare and number 2 via cold forceps  . RECTOVAGINAL FISTULA CLOSURE  2004   with temporary colostomy and subsequent take down, all at [facility].    Social History   Socioeconomic History  . Marital status: Married    Spouse name: Not on file  . Number of children: 1  . Years of education: Not on file  . Highest education level: Not on file  Occupational History  . Occupation: retired  Scientific laboratory technician  . Financial resource strain: Not on file  . Food insecurity    Worry: Not on file    Inability: Not on file  . Transportation needs    Medical: Not on file    Non-medical: Not on file  Tobacco Use  . Smoking status: Never Smoker  . Smokeless tobacco: Never Used  Substance and Sexual Activity  . Alcohol use: No    Alcohol/week: 0.0 standard drinks  . Drug use: No  . Sexual activity: Not on file  Lifestyle  . Physical activity    Days per week: Not on file    Minutes per session: Not on file  . Stress: Not on file  Relationships  . Social Herbalist on phone: Not on file    Gets together: Not on file    Attends religious service: Not on  file    Active member of club or organization: Not on file    Attends meetings of clubs or organizations: Not on file    Relationship status: Not on file  Other Topics Concern  . Not on file  Social History Narrative  . Not on file   Outpatient Encounter Medications as of 03/05/2019  Medication Sig  . acetaminophen (TYLENOL) 650 MG CR tablet Take 1 tablet (650 mg total) by mouth every 8 (eight) hours as needed for pain.  Marland Kitchen amLODipine (NORVASC) 10 MG tablet Take 10 mg by mouth daily.  . calcium carbonate (OS-CAL - DOSED IN MG OF ELEMENTAL CALCIUM) 1250 (500 Ca) MG tablet Take 1 tablet by mouth 2 (two) times daily with a meal.  . Cholecalciferol (CVS D3) 125 MCG (5000 UT) capsule TAKE 1 CAPSULE (5,000 UNITS TOTAL) BY MOUTH DAILY  . ciprofloxacin (CIPRO) 500 MG tablet 1 PO BID FOR 5 DAYS  . cyanocobalamin (,VITAMIN  B-12,) 1000 MCG/ML injection Inject 1 mL into the muscle every 14 (fourteen) days.  . Hyoscyamine Sulfate SL (LEVSIN/SL) 0.125 MG SUBL 1-2 sl 30 minutes prior to meals tid and at bedtime WHEN YOU HAVE DIARRHEA/ABDOMINAL PAIN. No more than 8 pills daily.  . magnesium chloride (SLOW-MAG) 64 MG TBEC SR tablet Take 1 tablet (64 mg total) by mouth 2 (two) times daily.  . metoprolol succinate (TOPROL-XL) 25 MG 24 hr tablet Take 25 mg by mouth daily.  . metroNIDAZOLE (FLAGYL) 500 MG tablet Take 1 tablet (500 mg total) by mouth 2 (two) times daily. FOR 5 DAYS  . omeprazole (PRILOSEC) 20 MG capsule Take 20 mg by mouth daily.  . Vitamin D, Ergocalciferol, (DRISDOL) 1.25 MG (50000 UT) CAPS capsule Take 1 capsule (50,000 Units total) by mouth every 7 (seven) days.   No facility-administered encounter medications on file as of 03/05/2019.    ALLERGIES: Allergies  Allergen Reactions  . Lisinopril     Cough   . Mercaptopurine     Neutropenia, pancreatitis  . Remicade [Infliximab] Other (See Comments)    Couldn't breath  . Codeine Rash   VACCINATION STATUS:  There is no immunization history on file for this patient.  Diabetes She presents for her follow-up diabetic visit. She has type 2 diabetes mellitus. Onset time: She was diagnosed at approximate age of 60 years . Her disease course has been stable. There are no hypoglycemic associated symptoms. Pertinent negatives for hypoglycemia include no confusion, headaches, pallor or seizures. There are no diabetic associated symptoms. Pertinent negatives for diabetes include no chest pain, no polydipsia, no polyphagia and no polyuria. There are no hypoglycemic complications. Symptoms are stable. There are no diabetic complications. Risk factors for coronary artery disease include diabetes mellitus and sedentary lifestyle. When asked about current treatments, none were reported. She is compliant with treatment most of the time. She is following a generally  unhealthy diet. When asked about meal planning, she reported none. She has not had a previous visit with a dietitian. She rarely participates in exercise. An ACE inhibitor/angiotensin II receptor blocker is contraindicated (She is allergic to ACE inhibitor's.).    Review of Systems  Constitutional: Negative for chills, fever and unexpected weight change.  HENT: Negative for trouble swallowing and voice change.   Eyes: Negative for visual disturbance.  Respiratory: Negative for cough, shortness of breath and wheezing.   Cardiovascular: Negative for chest pain, palpitations and leg swelling.  Gastrointestinal: Negative for diarrhea, nausea and vomiting.  Endocrine: Negative for cold intolerance,  heat intolerance, polydipsia, polyphagia and polyuria.  Musculoskeletal: Negative for arthralgias and myalgias.  Skin: Negative for color change, pallor, rash and wound.  Neurological: Negative for seizures and headaches.  Psychiatric/Behavioral: Negative for confusion and suicidal ideas.    Objective:    There were no vitals taken for this visit.  Wt Readings from Last 3 Encounters:  07/16/18 126 lb (57.2 kg)  05/30/18 123 lb 9.6 oz (56.1 kg)  01/03/18 120 lb 3.2 oz (54.5 kg)      CMP ( most recent) CMP     Component Value Date/Time   NA 141 02/27/2019 1346   K 3.3 (L) 02/27/2019 1346   CL 104 02/27/2019 1346   CO2 25 02/27/2019 1346   GLUCOSE 93 02/27/2019 1346   BUN 17 02/27/2019 1346   CREATININE 0.62 02/27/2019 1346   CALCIUM 9.9 02/27/2019 1346   PROT 7.8 02/27/2019 1346   ALBUMIN 3.5 (L) 12/23/2016 1148   AST 31 02/27/2019 1346   ALT 25 02/27/2019 1346   ALKPHOS 99 12/23/2016 1148   BILITOT 0.5 02/27/2019 1346   GFRNONAA 88 05/30/2018 1450   GFRAA 102 05/30/2018 1450     Diabetic Labs (most recent): Lab Results  Component Value Date   HGBA1C 5.7 (H) 02/27/2019   HGBA1C 5.8 (H) 07/12/2018   HGBA1C 5.7 (H) 07/06/2017     Assessment & Plan:   1. controlled type 2  diabetes mellitus with complication, without long-term current use of insulin ([place])  - Patient has currently controlled asymptomatic type 2 DM since  79 years of age. -She presents with stable A1c of 5.7%.  She is not on any antidiabetic medications currently.  Recent labs reviewed.  -I discussed with the patient the need to avoid acute and chronic complications of diabetes which include CAD, CVA, CKD, retinopathy, and neuropathy.  - I have counseled the patient on diet management  by adopting a carbohydrate restricted/protein rich diet.  - I encouraged the patient to switch to  unprocessed or minimally processed complex starch and increased protein intake (animal or plant source), fruits, and vegetables.  - Patient is advised to stick to a routine mealtimes to eat 3 meals  a day and  some snacks if necessary.  - I have approached patient with the following individualized plan to manage diabetes and patient agrees:   -She will continue off of medications for diabetes at this time. - If therapy is necessary in her case, insulin would be a better choice for her. - Patient is not a good candidate for incretin therapy, metformin, nor SGLT2 inhibitors.  - Patient specific target  A1c;  LDL, HDL, Triglycerides, and  Waist Circumference were discussed in detail.   - I advised patient to maintain close follow up with [name] for follow-up of hypertension, hyperlipidemia, as well as her primary care needs.  Time for this visit: 15 minutes. Judith Demps Urieta  participated in the discussions, expressed understanding, and voiced agreement with the above plans.  All questions were answered to her satisfaction. she is encouraged to contact clinic should she have any questions or concerns prior to her return visit.  Follow up plan: - Return in about 1 year (around 03/04/2020) for Next Visit A1c in Office.  Glade Lloyd, MD Phone: [phone]  Fax: [phone]  -  This note was partially  dictated with voice recognition software. Similar sounding words can be transcribed inadequately or may not  be corrected upon review.  03/05/2019, 5:21 PM

## 2019-05-07 ENCOUNTER — Encounter: Payer: Self-pay | Admitting: Gastroenterology

## 2019-05-20 ENCOUNTER — Other Ambulatory Visit: Payer: Self-pay

## 2019-05-20 ENCOUNTER — Telehealth: Payer: Self-pay

## 2019-05-20 MED ORDER — CVS D3 125 MCG (5000 UT) PO CAPS: ORAL_CAPSULE | ORAL | 3 refills | Status: DC

## 2019-05-20 NOTE — Telephone Encounter (Signed)
Opened in error

## 2019-08-08 ENCOUNTER — Ambulatory Visit: Payer: Medicare Other | Admitting: Gastroenterology

## 2019-08-08 ENCOUNTER — Other Ambulatory Visit: Payer: Self-pay

## 2019-08-08 ENCOUNTER — Encounter: Payer: Self-pay | Admitting: Gastroenterology

## 2019-08-08 DIAGNOSIS — K50018 Crohn's disease of small intestine with other complication: Secondary | ICD-10-CM | POA: Diagnosis not present

## 2019-08-08 DIAGNOSIS — N644 Mastodynia: Secondary | ICD-10-CM | POA: Diagnosis not present

## 2019-08-08 MED ORDER — CIPROFLOXACIN HCL 500 MG PO TABS: ORAL_TABLET | ORAL | 0 refills | Status: DC

## 2019-08-08 MED ORDER — METRONIDAZOLE 500 MG PO TABS: 500.0000 mg | ORAL_TABLET | Freq: Two times a day (BID) | ORAL | 0 refills | Status: DC

## 2019-08-08 NOTE — Patient Instructions (Addendum)
CALL [facility], [phone] TO SCHEDULE YOUR MAMMOGRAM.  HOLD IRON FOR ONE MONTH AND LET ME KNOW IF THE GI UPSET RESOLVES.  TAKE CIPRO & FLAGYL TWICE DAILY FOR 5 DAYS. MEDICATION SIDE EFFECTS INCLUDE HEEL PAIN, NAUSEA, VOMITING. AVOID ALCOHOL AND COUGH SYRUP WITH ALCOHOL.   PLEASE CALL IN ONE MONTH IF SYMPTOMS ARE NOT IMPROVED.   FOLLOW UP IN 2 MOS WITH DR.Edgel Degnan.

## 2019-08-08 NOTE — Assessment & Plan Note (Signed)
LEFT BREAST, SYMPTOMS NOT CONTROLLED.  CALL [facility], [phone] TO SCHEDULE YOUR MAMMOGRAM. FOLLOW UP IN 2 MOS.

## 2019-08-08 NOTE — Progress Notes (Signed)
Subjective:    Patient ID: Tamara Todd, female    DOB: 07/15/1941, 79 y.o.   MRN: [id number] Tamara Todd   HPI Lost 10 lbs since NOV 2019. NOT feeling well since SEP 2020. DOES GOOD FOR A WHILE THEN WAKES UP ONE AM AND FEELS BAD. USING TYLENOL TO CONTROL HER PAIN. BMs: NL THEN GETS LOOSE(SOFT/MUSHY). LAST FOR 1-2 DAYS. NO BLOOD IN HER STOOL. TRIGGERS: ?FOOD. SOMETIMES FEELS LIKE SHE NEEDS TO HAVE A BM AND CAN'T THEN IT'LL START BACK. STRESS LEVEL: CHANGED SINCE SITTING IN HOUSE AND CAN'T GO ANYWHERE. CAN'T HUG OR INTERACT AND IT'S JUST BORING. MILK: NO, ICE CREAM: NOT MUCH,CHOCOLATE: NO, CHEESE: NOT THAT MUCH. CAN'T DRINK LACTAID MILK. LAST CT JUL 2019 NO ACTIVE CROHN'S DISEASE. PMHx: SMALL INTESTINE BACTERIAL OVERGROWTH. LEFT BREAST FEELS ABNORMAL AND ITCHES.   ABDOMINAL PAIN: NOT OFTEN, MAY BE IN THE AM, TAKES TWO TYLENOLS AND IT GET'S BETTER, DULL, USUALLY RIGHT LOWER SIDE, ASSOCIATED WITH LOOSE BOWELS, MY LAST LESS THAN 1 HOUR.  PT DENIES FEVER, CHILLS, HEMATOCHEZIA, HEMATEMESIS, nausea, vomiting, melena, diarrhea, CHEST PAIN, SHORTNESS OF BREATH, CHANGE IN BOWEL IN HABITS, problems swallowing, OR heartburn or indigestion.  Past Medical History:  Diagnosis Date  . Crohn's disease ([place])    used to be followed by Dr. Algis Greenhouse. diagnosis in her 32s. multiple surgeries. remicade allergy.  . Diabetes ([place])   . GERD (gastroesophageal reflux disease)   . HTN (hypertension)   . Hyperlipidemia   . Hypertension   . Osteoporosis       . Pancreatitis    09/2015   Past Surgical History:  Procedure Laterality Date  . BACTERIAL OVERGROWTH TEST N/A 02/24/2016   POSITIVE for small bowel bacterial overgrowth   . BIOPSY  02/10/2016     . BOWEL RESECTION     TWICE for stricture, including right colectomy with terminal ileal resection  . COLONOSCOPY    . COLONOSCOPY N/A 02/10/2016   Dr. Oneida Alar: normal neo terminal ileum, diverticula in sigmoid colon, non-bleeding internal hemorrhoids,  adenomatous rectal polyp.   . ESOPHAGOGASTRODUODENOSCOPY N/A 02/10/2016   Dr. Oneida Alar: chronic gastritis, small hiatal hernia, few benign-appearing gastric polyps, no source for weight loss.   Marland Kitchen PARTIAL HYSTERECTOMY    . POLYPECTOMY  02/10/2016     . RECTOVAGINAL FISTULA CLOSURE  2004   with temporary colostomy and subsequent take down, all at [facility].    Allergies  Allergen Reactions  . Lisinopril     Cough   . Mercaptopurine     Neutropenia, pancreatitis  . Remicade [Infliximab] Other (See Comments)    Couldn't breath  . Codeine Rash   Current Outpatient Medications  Medication Sig    . acetaminophen (TYLENOL) 650 MG CR tablet Take 1 tablet (650 mg total) by mouth every 8 (eight) hours as needed for pain.    Marland Kitchen amLODipine (NORVASC) 10 MG tablet Take 10 mg by mouth daily.    . calcium carbonate (OS-CAL - DOSED IN MG OF ELEMENTAL CALCIUM) 1250 (500 Ca) MG tablet Take 1 tablet by mouth 2 (two) times daily with a meal.    . Cholecalciferol (CVS D3) 125 MCG (5000 UT) capsule TAKE 1 CAPSULE (5,000 UNITS TOTAL) BY MOUTH DAILY    . cyanocobalamin (,VITAMIN B-12,) 1000 MCG/ML injection Inject 1 mL into the muscle every 14 (fourteen) days.    . Hyoscyamine Sulfate SL (LEVSIN/SL) 0.125 MG SUBL 1-2 sl 30 minutes prior to meals tid and at bedtime WHEN YOU HAVE DIARRHEA/ABDOMINAL PAIN.  No more than 8 pills daily.    . magnesium chloride (SLOW-MAG) 64 MG TBEC SR tablet Take 1 tablet (64 mg total) by mouth 2 (two) times daily.    . metoprolol succinate (TOPROL-XL) 25 MG 24 hr tablet Take 25 mg by mouth daily.    Marland Kitchen omeprazole (PRILOSEC) 20 MG capsule Take 20 mg by mouth daily.    . IRON TABLETS ONCE A DAY     Review of Systems PER HPI OTHERWISE ALL SYSTEMS ARE NEGATIVE.    Objective:   Physical Exam Constitutional:      General: She is not in acute distress.    Appearance: Normal appearance.  HENT:     Mouth/Throat:     Comments: MASK IN PLACE Eyes:     General: No scleral icterus.    Pupils:  Pupils are equal, round, and reactive to light.  Cardiovascular:     Rate and Rhythm: Normal rate and regular rhythm.     Pulses: Normal pulses.     Heart sounds: Normal heart sounds.  Pulmonary:     Effort: Pulmonary effort is normal.     Breath sounds: Normal breath sounds.  Chest:       Comments: Tenderness to palpation, NO MASS OR NODULE APPRECIATED, OVERLYING SKIN NORMAL Abdominal:     General: Bowel sounds are normal.     Palpations: Abdomen is soft.     Tenderness: There is no abdominal tenderness.  Musculoskeletal:     Cervical back: Normal range of motion.     Right lower leg: No edema.     Left lower leg: No edema.  Lymphadenopathy:     Cervical: No cervical adenopathy.  Skin:    General: Skin is warm and dry.  Neurological:     Mental Status: She is alert and oriented to person, place, and time.     Comments: NO  NEW FOCAL DEFICITS  Psychiatric:        Mood and Affect: Mood normal.     Comments: FLAT AFFECT           Assessment & Plan:

## 2019-08-08 NOTE — Assessment & Plan Note (Signed)
SYMPTOMS FAIRLY WELL CONTROLLED OFF MEDS. INTERMITTENT ABDOMINAL PAIN/HANGE IN BOWEL HABITS ARE MOST LIKELY DUE TO SMALL INTESTINE BACTERIAL OVERGROWTH AND IRON PILLS.  GET LABS FROM DR. ELLIOTT. HOLD IRON FOR ONE MONTH AND LET ME KNOW IF THE GI UPSET RESOLVES. TAKE CIPRO & FLAGYL TWICE DAILY FOR 5 DAYS. MEDICATION SIDE EFFECTS INCLUDE HEEL PAIN, NAUSEA, VOMITING. AVOID ALCOHOL AND COUGH SYRUP WITH ALCOHOL.  PLEASE CALL IN ONE MONTH IF SYMPTOMS ARE NOT IMPROVED.  FOLLOW UP IN 2 MOS WITH DR.Gracelynne Benedict.

## 2019-09-11 ENCOUNTER — Encounter: Payer: Self-pay | Admitting: Gastroenterology

## 2019-09-11 ENCOUNTER — Other Ambulatory Visit: Payer: Self-pay

## 2019-09-11 ENCOUNTER — Ambulatory Visit: Payer: Medicare Other | Admitting: Gastroenterology

## 2019-09-11 DIAGNOSIS — K50018 Crohn's disease of small intestine with other complication: Secondary | ICD-10-CM

## 2019-09-11 DIAGNOSIS — M810 Age-related osteoporosis without current pathological fracture: Secondary | ICD-10-CM

## 2019-09-11 DIAGNOSIS — E559 Vitamin D deficiency, unspecified: Secondary | ICD-10-CM

## 2019-09-11 DIAGNOSIS — D509 Iron deficiency anemia, unspecified: Secondary | ICD-10-CM

## 2019-09-11 NOTE — Assessment & Plan Note (Signed)
SYMPTOMS FAIRLY WELL CONTROLLED. NO BRBPR OR MELENA. ACTIVE DISEASE. CURRENTLY FEELING MALAISE.  NEEDS CBC/CMP IN AUG 2021.  EAT TO LIVE AND THINK OF FOOD AS MEDICINE. 75% OF YOUR PLATE SHOULD BE FRUITS/VEGGIES. To have more energy,       1. DRINK WATER WITH FRUIT OR CUCUMBER ADDED. YOUR URINE SHOULD BE LIGHT YELLOW. AVOID SODA, GATORADE, ENERGY DRINKS, OR DIET SODA.     2. AVOID HIGH FRUCTOSE CORN SYRUP AND CAFFEINE.     3. DO NOT chew SUGAR FREE GUM OR USE ARTIFICIAL SWEETENERS. IF NEEDED USE STEVIA AS A SWEETENER.    4. DO NOT EAT ENRICHED WHEAT FLOUR, PASTA, RICE, OR CEREAL.    5. ONLY EAT WILD CAUGHT SEAFOOD, GRASS FED BEEF OR CHICKEN, PORK FROM PASTURE RAISE PIGS, OR EGGS FROM PASTURE RAISED CHICKENS.    6. PRACTICE CHAIR YOGA FOR 15-30 MINS 3 OR 4 TIMES A WEEK AND PROGRESS TO HATHA YOGA OVER NEXT 6 MOS. HANDOUT GIVEN. EXERCISES DEMONSTRATED.    7. START TAKING A MULTIVITAMIN.   ADDITIONAL SUPPLEMENTS TO INCREASE ENERGY:    1. GREEN TEA EXTRACT ONE DAILY.    2. ALPHA LIPOIC ACID TWICE DAILY.   **NATURAL ANTI-INFLAMMATORY SUPPLEMENT     FOLLOW UP IN 2 MOS WITH DR. Mohan Erven.

## 2019-09-11 NOTE — Patient Instructions (Addendum)
EAT TO LIVE AND THINK OF FOOD AS MEDICINE. 75% OF YOUR PLATE SHOULD BE FRUITS/VEGGIES.  To have more energy,          1. DRINK WATER WITH FRUIT OR CUCUMBER ADDED. YOUR URINE SHOULD BE LIGHT YELLOW. AVOID SODA, GATORADE, ENERGY DRINKS, OR DIET SODA.     2. AVOID HIGH FRUCTOSE CORN SYRUP AND CAFFEINE.     3. DO NOT chew SUGAR FREE GUM OR USE ARTIFICIAL SWEETENERS. IF NEEDED USE STEVIA AS A SWEETENER.    4. DO NOT EAT ENRICHED WHEAT FLOUR, PASTA, RICE, OR CEREAL.    5. ONLY EAT WILD CAUGHT SEAFOOD, GRASS FED BEEF OR CHICKEN, PORK FROM PASTURE RAISE PIGS, OR EGGS FROM PASTURE RAISED CHICKENS.    6. PRACTICE CHAIR YOGA FOR 15-30 MINS 3 OR 4 TIMES A WEEK AND PROGRESS TO HATHA YOGA OVER NEXT 6 MOS.     7. START TAKING A MULTIVITAMIN.   ADDITIONAL SUPPLEMENTS TO INCREASE ENERGY:    1. GREEN TEA EXTRACT ONE DAILY.    2. ALPHA LIPOIC ACID TWICE DAILY.   **NATURAL ANTI-INFLAMMATORY SUPPLEMENT   ADDITIONAL SUPPLEMENTS TO INCREASE ENERGY:    1. GREEN TEA EXTRACT ONE DAILY.    2. ALPHA LIPOIC ACID TWICE DAILY.   **NATURAL ANTI-INFLAMMATORY SUPPLEMENT     FOLLOW UP IN 2 MOS WITH DR. Myrene Bougher.

## 2019-09-11 NOTE — Progress Notes (Signed)
Subjective:    Patient ID: Tamara Todd, female    DOB: 04/10/1941, 79 y.o.   MRN: [id number]  Tamara Todd  HPI Feeling blah due being locked up in the house. Took first COVID SHOT MON and arm is sore.BMs: 1-2X A DAY. APPETITE: NOT REAL GOOD. COOKS MOST OF THE THE TIME. LAST DIARRHEA: 1 DAY LAST WEEK ONE TIME. LOST 10 LBS SINCE NOV 2019. NO SORES IN MOUTH, RASH ON LEGS, OR BACK PAIN. KNEES BOTHER HER SOMETIMES. OCCASIONAL HEADACHES THAT DOESN'T;T RESPOND TO MEDS. ABDOMINAL PAIN: DULL PAIN AND IT'S IN THE MIDDLE AND ON THE SIDES. TOOK CIPRO FLAGYL/CIPRO. IT HELPED BOWEL BUT BUT MADE HER FEEL BAD.  PT DENIES FEVER, CHILLS, HEMATOCHEZIA, HEMATEMESIS, nausea, vomiting, melena, diarrhea, CHEST PAIN, SHORTNESS OF BREATH, CHANGE IN BOWEL IN HABITS, constipation,  problems swallowing, ODYNOPHAGIA, OR heartburn or indigestion.  Past Medical History:  Diagnosis Date  . Crohn's disease ([place])    used to be followed by Dr. Algis Greenhouse. diagnosis in her 52s. multiple surgeries. remicade allergy.  . Diabetes ([place])   . GERD (gastroesophageal reflux disease)   . HTN (hypertension)   . Hyperlipidemia   . Hypertension   . Osteoporosis       . Pancreatitis    09/2015    Past Surgical History:  Procedure Laterality Date  . BACTERIAL OVERGROWTH TEST N/A 02/24/2016   POSITIVE for small bowel bacterial overgrowth   . BIOPSY  02/10/2016   Procedure: BIOPSY;  Surgeon: Danie Binder, MD;  [facility];  Service: Endoscopy;;  Random colon biopsies Gastric biopsies for h pylori gastric polyp biopsies  . BOWEL RESECTION     TWICE for stricture, including right colectomy with terminal ileal resection  . COLONOSCOPY    . COLONOSCOPY N/A 02/10/2016   Dr. Oneida Alar: normal neo terminal ileum, diverticula in sigmoid colon, non-bleeding internal hemorrhoids, adenomatous rectal polyp.   . ESOPHAGOGASTRODUODENOSCOPY N/A 02/10/2016   Dr. Oneida Alar: chronic gastritis, small hiatal hernia, few benign-appearing  gastric polyps, no source for weight loss.   Marland Kitchen PARTIAL HYSTERECTOMY    . POLYPECTOMY  02/10/2016   Procedure: POLYPECTOMY;  Surgeon: Danie Binder, MD;  [facility];  Service: Endoscopy;;  Rectal polyps x 2 removed 1 by hot snare and number 2 via cold forceps  . RECTOVAGINAL FISTULA CLOSURE  2004   with temporary colostomy and subsequent take down, all at [facility].    Allergies  Allergen Reactions  . Lisinopril     Cough   . Mercaptopurine     Neutropenia, pancreatitis  . Remicade [Infliximab] Other (See Comments)    Couldn't breath  . Codeine Rash   Current Outpatient Medications  Medication Sig    . acetaminophen (TYLENOL) 650 MG CR tablet Take 1 tablet (650 mg total) by mouth every 8 (eight) hours as needed for pain.    Marland Kitchen amLODipine (NORVASC) 10 MG tablet Take 10 mg by mouth daily.    . calcium carbonate (OS-CAL - DOSED IN MG OF ELEMENTAL CALCIUM) 1250 (500 Ca) MG tablet Take 1 tablet by mouth 2 (two) times daily with a meal.    . Cholecalciferol (CVS D3) 125 MCG (5000 UT) capsule TAKE 1 CAPSULE (5,000 UNITS TOTAL) BY MOUTH DAILY    . cyanocobalamin (,VITAMIN B-12,) 1000 MCG/ML injection Inject 1 mL into the muscle every 14 (fourteen) days.    . metoprolol succinate (TOPROL-XL) 25 MG 24 hr tablet Take 25 mg by mouth daily.    Marland Kitchen  omeprazole (PRILOSEC) 20 MG capsule Take 20 mg by mouth daily.    .      .      .      .      .       Review of Systems PER HPI OTHERWISE ALL SYSTEMS ARE NEGATIVE.    Objective:   Physical Exam Constitutional:      General: She is not in acute distress.    Appearance: Normal appearance.  HENT:     Mouth/Throat:     Comments: MASK IN PLACE Eyes:     General: No scleral icterus.    Pupils: Pupils are equal, round, and reactive to light.  Cardiovascular:     Rate and Rhythm: Normal rate and regular rhythm.     Pulses: Normal pulses.     Heart sounds: Normal heart sounds.  Pulmonary:     Effort: Pulmonary effort is normal.     Breath  sounds: Normal breath sounds.  Abdominal:     General: Bowel sounds are normal.     Palpations: Abdomen is soft.     Tenderness: There is no abdominal tenderness.  Musculoskeletal:     Cervical back: Normal range of motion.     Right lower leg: No edema.     Left lower leg: No edema.  Lymphadenopathy:     Cervical: No cervical adenopathy.  Skin:    General: Skin is warm and dry.  Neurological:     Mental Status: She is alert and oriented to person, place, and time.     Comments: NO  NEW FOCAL DEFICITS  Psychiatric:        Mood and Affect: Mood normal.     Comments: NORMAL AFFECT       Assessment & Plan:

## 2019-09-11 NOTE — Assessment & Plan Note (Signed)
FOLLOWED BY DR. NIDA. LAST DEXA 2018.  CONTINUE TO MONITOR SYMPTOMS.

## 2019-09-11 NOTE — Assessment & Plan Note (Signed)
FOLLOWED BY DR.NIDA.  NEEDS VITAMIN D LEVEL AUG 2021.

## 2019-09-12 NOTE — Progress Notes (Signed)
Cc'ed to pcp °

## 2019-09-25 ENCOUNTER — Ambulatory Visit: Payer: Medicare Other | Admitting: Gastroenterology

## 2019-11-07 ENCOUNTER — Ambulatory Visit: Payer: Medicare Other | Admitting: Gastroenterology

## 2019-11-07 ENCOUNTER — Encounter: Payer: Self-pay | Admitting: *Deleted

## 2019-11-07 ENCOUNTER — Encounter: Payer: Self-pay | Admitting: Gastroenterology

## 2019-11-07 ENCOUNTER — Ambulatory Visit (HOSPITAL_COMMUNITY): Admission: RE | Admit: 2019-11-07 | Payer: Medicare Other | Source: Ambulatory Visit

## 2019-11-07 ENCOUNTER — Other Ambulatory Visit: Payer: Self-pay

## 2019-11-07 DIAGNOSIS — K50018 Crohn's disease of small intestine with other complication: Secondary | ICD-10-CM | POA: Diagnosis not present

## 2019-11-07 DIAGNOSIS — M810 Age-related osteoporosis without current pathological fracture: Secondary | ICD-10-CM

## 2019-11-07 NOTE — Progress Notes (Signed)
Subjective:    Patient ID: Tamara Todd, female    DOB: 04/12/1941, 79 y.o.   MRN: [id number] [name]  HPI Had an area on abdomen that was noticeable. BEEN AWARE SINCE FEB 2021. NOW IT'S GONE AND SHE CAN'T FEEL IT AND IT DOESN'T [place]. MAMMOGRAM IS UP TO DATE. BMs: GOOD WEEKS, BAD WEEKS: LOOSE BOWEL, BLOATING. COVID VACCINATION COMPLETE. 2ND ONE MADE HER SICK. GETS FLU SHOT EVERY YEAR.  PT DENIES FEVER, CHILLS, HEMATOCHEZIA, HEMATEMESIS, nausea, vomiting, melena, diarrhea, CHEST PAIN, SHORTNESS OF BREATH, CHANGE IN BOWEL IN HABITS, constipation, abdominal pain, problems swallowing, OR heartburn or indigestion.  Past Medical History:  Diagnosis Date  . Crohn's disease ([place])    used to be followed by Dr. Algis Greenhouse. diagnosis in her 94s. multiple surgeries. remicade allergy.  . Diabetes ([place])   . GERD (gastroesophageal reflux disease)   . HTN (hypertension)   . Hyperlipidemia   . Hypertension   . Osteoporosis       . Pancreatitis    09/2015   Past Surgical History:  Procedure Laterality Date  . BACTERIAL OVERGROWTH TEST N/A 02/24/2016   POSITIVE for small bowel bacterial overgrowth   . BIOPSY  02/10/2016   Procedure: BIOPSY;  Surgeon: Danie Binder, MD;  [facility];  Service: Endoscopy;;  Random colon biopsies Gastric biopsies for h pylori gastric polyp biopsies  . BOWEL RESECTION     TWICE for stricture, including right colectomy with terminal ileal resection  . COLONOSCOPY    . COLONOSCOPY N/A 02/10/2016   Dr. Oneida Alar: normal neo terminal ileum, diverticula in sigmoid colon, non-bleeding internal hemorrhoids, adenomatous rectal polyp.   . ESOPHAGOGASTRODUODENOSCOPY N/A 02/10/2016   Dr. Oneida Alar: chronic gastritis, small hiatal hernia, few benign-appearing gastric polyps, no source for weight loss.   Marland Kitchen PARTIAL HYSTERECTOMY    . POLYPECTOMY  02/10/2016   Procedure: POLYPECTOMY;  Surgeon: Danie Binder, MD;  [facility];  Service: Endoscopy;;  Rectal  polyps x 2 removed 1 by hot snare and number 2 via cold forceps  . RECTOVAGINAL FISTULA CLOSURE  2004   with temporary colostomy and subsequent take down, all at [facility].    Allergies  Allergen Reactions  . Flagyl [Metronidazole]     WEAKNESS, MALAISE  . Lisinopril     Cough   . Mercaptopurine     Neutropenia, pancreatitis  . Remicade [Infliximab] Other (See Comments)    Couldn't breath  . Codeine Rash   Current Outpatient Medications  Medication Sig    . acetaminophen (TYLENOL) 650 MG CR tablet Take 1 tablet (650 mg total) by mouth every 8 (eight) hours as needed for pain.    Marland Kitchen amLODipine (NORVASC) 10 MG tablet Take 10 mg by mouth daily.    . calcium carbonate (OS-CAL - DOSED IN MG OF ELEMENTAL CALCIUM) 1250 (500 Ca) MG tablet Take 1 tablet by mouth 2 (two) times daily with a meal.    . Cholecalciferol (CVS D3) 125 MCG (5000 UT) capsule TAKE 1 CAPSULE (5,000 UNITS TOTAL) BY MOUTH DAILY    . cyanocobalamin (,VITAMIN B-12,) 1000 MCG/ML injection Inject 1 mL into the muscle every 14 (fourteen) days.    . metoprolol succinate (TOPROL-XL) 25 MG 24 hr tablet Take 25 mg by mouth daily.    Marland Kitchen omeprazole (PRILOSEC) 20 MG capsule Take 20 mg by mouth daily.     Review of Systems PER HPI OTHERWISE ALL SYSTEMS ARE NEGATIVE.     Objective:  Physical Exam Constitutional:      General: She is not in acute distress.    Appearance: Normal appearance.  HENT:     Mouth/Throat:     Comments: MASK IN PLACE Eyes:     General: No scleral icterus.    Pupils: Pupils are equal, round, and reactive to light.  Cardiovascular:     Rate and Rhythm: Normal rate and regular rhythm.     Pulses: Normal pulses.     Heart sounds: Normal heart sounds.  Pulmonary:     Effort: Pulmonary effort is normal.     Breath sounds: Normal breath sounds.  Abdominal:     General: Bowel sounds are normal.     Palpations: Abdomen is soft.     Tenderness: There is no abdominal tenderness.  Musculoskeletal:      Cervical back: Normal range of motion.     Right lower leg: No edema.     Left lower leg: No edema.  Lymphadenopathy:     Cervical: No cervical adenopathy.  Skin:    General: Skin is warm and dry.  Neurological:     Mental Status: She is alert and oriented to person, place, and time.     Comments: NO  NEW FOCAL DEFICITS  Psychiatric:        Mood and Affect: Mood normal.     Comments: NORMAL AFFECT       Assessment & Plan:

## 2019-11-07 NOTE — Assessment & Plan Note (Signed)
LAST DEXA 2018.  COMPLETE DEXA SCAN. COMPLETE VITAMIN D LEVEL IN AUG 2021. FOLLOW UP IN 6 MOS WITH LESLIE LEWIS.  PLEASE CALL WITH QUESTIONS OR CONCERNS.

## 2019-11-07 NOTE — Assessment & Plan Note (Signed)
SURGICAL REMISSION AND COURSE COMPLICATED BY OSTEOPOROSIS.  COMPLETE DEXA SCAN. COMPLETE VITAMIN D LEVEL IN AUG 2021. FOLLOW UP IN 6 MOS WITH LESLIE LEWIS.  PLEASE CALL WITH QUESTIONS OR CONCERNS.

## 2019-11-07 NOTE — Patient Instructions (Signed)
COMPLETE DEXA SCAN.  COMPLETE VITAMIN D LEVEL IN AUG 2021.  FOLLOW UP IN 6 MOS WITH LESLIE LEWIS.    PLEASE CALL WITH QUESTIONS OR CONCERNS.

## 2019-11-14 ENCOUNTER — Other Ambulatory Visit: Payer: Self-pay

## 2019-11-14 ENCOUNTER — Ambulatory Visit (HOSPITAL_COMMUNITY)
Admission: RE | Admit: 2019-11-14 | Discharge: 2019-11-14 | Disposition: A | Discharge status: Home / Self Care | Payer: Medicare Other | Source: Ambulatory Visit | Attending: Gastroenterology | Admitting: Gastroenterology

## 2019-11-14 DIAGNOSIS — M810 Age-related osteoporosis without current pathological fracture: Secondary | ICD-10-CM | POA: Diagnosis not present

## 2019-11-18 ENCOUNTER — Telehealth: Payer: Self-pay | Admitting: Gastroenterology

## 2019-11-18 NOTE — Telephone Encounter (Signed)
Called pt unable to leave vm.no mailbox

## 2019-11-18 NOTE — Telephone Encounter (Signed)
PLEASE CALL PT. HER DEXA SCAN IS BETTER THAN 2015 BUT STILL SHOWS OSTEOPOROSIS. SHE SHOULD SEE DR. NIDA. REPEAT DEXA IN 2 YEARS.

## 2019-11-19 NOTE — Telephone Encounter (Signed)
Called pt verified name and dob Notified pt of results Pt stated she understood and thanked me for the call

## 2020-03-10 ENCOUNTER — Other Ambulatory Visit: Payer: Self-pay

## 2020-03-10 ENCOUNTER — Encounter: Payer: Self-pay | Admitting: "Endocrinology

## 2020-03-10 ENCOUNTER — Telehealth (INDEPENDENT_AMBULATORY_CARE_PROVIDER_SITE_OTHER): Payer: Medicare Other | Admitting: "Endocrinology

## 2020-03-10 DIAGNOSIS — E119 Type 2 diabetes mellitus without complications: Secondary | ICD-10-CM | POA: Diagnosis not present

## 2020-03-10 DIAGNOSIS — E559 Vitamin D deficiency, unspecified: Secondary | ICD-10-CM | POA: Diagnosis not present

## 2020-03-10 NOTE — Progress Notes (Signed)
03/10/2020                                    Endocrinology Telehealth Visit Follow up Note -During COVID -19 Pandemic  This visit type was conducted  via telephone due to national recommendations for restrictions regarding the COVID-19 Pandemic  in an effort to limit this patient's exposure and mitigate transmission of the corona virus.   I connected with Tamara Todd on 03/10/2020   by telephone and verified that I am speaking with the correct person using two identifiers. Tamara Todd, [birth year]. she has verbally consented to this visit.  I was in my office and patient was in her residence. No other persons were with me during the encounter. All issues noted in this document were discussed and addressed. The format was not optimal for physical exam.    Subjective:    Patient ID: Tamara Todd, female    DOB: Sep 01, 1940. Patient is being engaged in telehealth via telephone for management of currently controlled type 2 diabetes.   Past Medical History:  Diagnosis Date  . Crohn's disease ([place])    used to be followed by Dr. Algis Greenhouse. diagnosis in her 19s. multiple surgeries. remicade allergy.  . Diabetes ([place])   . GERD (gastroesophageal reflux disease)   . HTN (hypertension)   . Hyperlipidemia   . Hypertension   . Osteoporosis       . Pancreatitis    09/2015   Past Surgical History:  Procedure Laterality Date  . BACTERIAL OVERGROWTH TEST N/A 02/24/2016   POSITIVE for small bowel bacterial overgrowth   . BIOPSY  02/10/2016   Procedure: BIOPSY;  Surgeon: Danie Binder, MD;  [facility];  Service: Endoscopy;;  Random colon biopsies Gastric biopsies for h pylori gastric polyp biopsies  . BOWEL RESECTION     TWICE for stricture, including right colectomy with terminal ileal resection  . COLONOSCOPY    . COLONOSCOPY N/A 02/10/2016   Dr. Oneida Alar: normal neo terminal ileum, diverticula in sigmoid colon, non-bleeding internal hemorrhoids, adenomatous rectal polyp.   .  ESOPHAGOGASTRODUODENOSCOPY N/A 02/10/2016   Dr. Oneida Alar: chronic gastritis, small hiatal hernia, few benign-appearing gastric polyps, no source for weight loss.   Marland Kitchen PARTIAL HYSTERECTOMY    . POLYPECTOMY  02/10/2016   Procedure: POLYPECTOMY;  Surgeon: Danie Binder, MD;  [facility];  Service: Endoscopy;;  Rectal polyps x 2 removed 1 by hot snare and number 2 via cold forceps  . RECTOVAGINAL FISTULA CLOSURE  2004   with temporary colostomy and subsequent take down, all at [facility].    Social History   Socioeconomic History  . Marital status: Married    Spouse name: Not on file  . Number of children: 1  . Years of education: Not on file  . Highest education level: Not on file  Occupational History  . Occupation: retired  Tobacco Use  . Smoking status: Never Smoker  . Smokeless tobacco: Never Used  Vaping Use  . Vaping Use: Never used  Substance and Sexual Activity  . Alcohol use: No    Alcohol/week: 0.0 standard drinks  . Drug use: No  . Sexual activity: Not on file  Other Topics Concern  . Not on file  Social History Narrative  . Not on file   Social Determinants of Health   Financial Resource Strain:   . Difficulty of Paying Living  Expenses: Not on file  Food Insecurity:   . Worried About Charity fundraiser in the Last Year: Not on file  . Ran Out of Food in the Last Year: Not on file  Transportation Needs:   . Lack of Transportation (Medical): Not on file  . Lack of Transportation (Non-Medical): Not on file  Physical Activity:   . Days of Exercise per Week: Not on file  . Minutes of Exercise per Session: Not on file  Stress:   . Feeling of Stress : Not on file  Social Connections:   . Frequency of Communication with Friends and Family: Not on file  . Frequency of Social Gatherings with Friends and Family: Not on file  . Attends Religious Services: Not on file  . Active Member of Clubs or Organizations: Not on file  . Attends Archivist Meetings:  Not on file  . Marital Status: Not on file   Outpatient Encounter Medications as of 03/10/2020  Medication Sig  . acetaminophen (TYLENOL) 650 MG CR tablet Take 1 tablet (650 mg total) by mouth every 8 (eight) hours as needed for pain.  Marland Kitchen amLODipine (NORVASC) 10 MG tablet Take 10 mg by mouth daily.  . calcium carbonate (OS-CAL - DOSED IN MG OF ELEMENTAL CALCIUM) 1250 (500 Ca) MG tablet Take 1 tablet by mouth 2 (two) times daily with a meal.  . Cholecalciferol (CVS D3) 125 MCG (5000 UT) capsule TAKE 1 CAPSULE (5,000 UNITS TOTAL) BY MOUTH DAILY  . cyanocobalamin (,VITAMIN B-12,) 1000 MCG/ML injection Inject 1 mL into the muscle every 14 (fourteen) days.  . metoprolol succinate (TOPROL-XL) 25 MG 24 hr tablet Take 25 mg by mouth daily.  Marland Kitchen omeprazole (PRILOSEC) 20 MG capsule Take 20 mg by mouth daily.   No facility-administered encounter medications on file as of 03/10/2020.   ALLERGIES: Allergies  Allergen Reactions  . Flagyl [Metronidazole]     WEAKNESS, MALAISE  . Lisinopril     Cough   . Mercaptopurine     Neutropenia, pancreatitis  . Remicade [Infliximab] Other (See Comments)    Couldn't breath  . Codeine Rash   VACCINATION STATUS:  There is no immunization history on file for this patient.  Diabetes She presents for her follow-up diabetic visit. She has type 2 diabetes mellitus. Onset time: She was diagnosed at approximate age of 58 years . Her disease course has been stable. There are no hypoglycemic associated symptoms. Pertinent negatives for hypoglycemia include no confusion, headaches, pallor or seizures. There are no diabetic associated symptoms. Pertinent negatives for diabetes include no chest pain, no polydipsia, no polyphagia and no polyuria. There are no hypoglycemic complications. Symptoms are stable. There are no diabetic complications. Risk factors for coronary artery disease include diabetes mellitus and sedentary lifestyle. When asked about current treatments, none  were reported. She is compliant with treatment most of the time. Her weight is decreasing steadily. She is following a generally unhealthy diet. When asked about meal planning, she reported none. She has not had a previous visit with a dietitian. She rarely participates in exercise. An ACE inhibitor/angiotensin II receptor blocker is contraindicated (She is allergic to ACE inhibitor's.).    Review of Systems  Constitutional: Negative for chills, fever and unexpected weight change.  HENT: Negative for trouble swallowing and voice change.   Eyes: Negative for visual disturbance.  Respiratory: Negative for cough, shortness of breath and wheezing.   Cardiovascular: Negative for chest pain, palpitations and leg swelling.  Gastrointestinal: Negative for  diarrhea, nausea and vomiting.  Endocrine: Negative for cold intolerance, heat intolerance, polydipsia, polyphagia and polyuria.  Musculoskeletal: Negative for arthralgias and myalgias.  Skin: Negative for color change, pallor, rash and wound.  Neurological: Negative for seizures and headaches.  Psychiatric/Behavioral: Negative for confusion and suicidal ideas.    Objective:    There were no vitals taken for this visit.  Wt Readings from Last 3 Encounters:  11/07/19 112 lb 12.8 oz (51.2 kg)  09/11/19 113 lb 6.4 oz (51.4 kg)  08/08/19 113 lb (51.3 kg)      CMP ( most recent) CMP     Component Value Date/Time   NA 141 02/27/2019 1346   K 3.3 (L) 02/27/2019 1346   CL 104 02/27/2019 1346   CO2 25 02/27/2019 1346   GLUCOSE 93 02/27/2019 1346   BUN 17 02/27/2019 1346   CREATININE 0.62 02/27/2019 1346   CALCIUM 9.9 02/27/2019 1346   PROT 7.8 02/27/2019 1346   ALBUMIN 3.5 (L) 12/23/2016 1148   AST 31 02/27/2019 1346   ALT 25 02/27/2019 1346   ALKPHOS 99 12/23/2016 1148   BILITOT 0.5 02/27/2019 1346   GFRNONAA 88 05/30/2018 1450   GFRAA 102 05/30/2018 1450    Diabetic Labs (most recent): Lab Results  Component Value Date   HGBA1C  5.7 (H) 02/27/2019   HGBA1C 5.8 (H) 07/12/2018   HGBA1C 5.7 (H) 07/06/2017     Assessment & Plan:   1. controlled type 2 diabetes mellitus with complication, without long-term current use of insulin ([place])  - Patient has currently controlled asymptomatic type 2 DM since  79 years of age. -She is not on any antidiabetic medications.  Her recent labs show A1c of 5.7%.    Recent labs reviewed.  -I discussed with the patient the need to avoid acute and chronic complications of diabetes which include CAD, CVA, CKD, retinopathy, and neuropathy.  - I have counseled the patient on diet management  by adopting a carbohydrate restricted/protein rich diet.  - I encouraged the patient to increase her intake of  unprocessed or minimally processed complex starch and increased protein intake (animal or plant source), fruits, and vegetables.  - Patient is advised to stick to a routine mealtimes to eat 3 meals  a day and  some snacks if necessary.  - I have approached patient with the following individualized plan to manage diabetes and patient agrees:   -In light of her controlled glycemia with A1c of 5.4%, she still will not need any antidiabetic medication at this time.   - If therapy is necessary in her case, insulin would be a better choice for her. - Patient is not a good candidate for incretin therapy, metformin, nor SGLT2 inhibitors.  - Patient specific target  A1c;  LDL, HDL, Triglycerides, and  Waist Circumference were discussed in detail.   - I advised patient to maintain close follow up with [name] for follow-up of hypertension, hyperlipidemia, as well as her primary care needs.     - Time spent on this patient care encounter:  20 minutes of which 50% was spent in  counseling and the rest reviewing  her current and  previous labs / studies and medications  doses and developing a plan for long term care. Tamara Todd  participated in the discussions, expressed understanding,  and voiced agreement with the above plans.  All questions were answered to her satisfaction. she is encouraged to contact clinic should she have any questions or concerns  prior to her return visit.   Follow up plan: - Return in about 6 months (around 09/10/2020) for F/U with Pre-visit Labs.  Glade Lloyd, MD Phone: [phone]  Fax: [phone]  -  This note was partially dictated with voice recognition software. Similar sounding words can be transcribed inadequately or may not  be corrected upon review.  03/10/2020, 12:41 PM

## 2020-03-24 ENCOUNTER — Encounter: Payer: Self-pay | Admitting: Gastroenterology

## 2020-04-21 ENCOUNTER — Encounter: Payer: Self-pay | Admitting: Gastroenterology

## 2020-05-08 ENCOUNTER — Ambulatory Visit: Payer: Medicare Other | Admitting: Gastroenterology

## 2020-05-15 ENCOUNTER — Ambulatory Visit: Payer: Medicare Other | Admitting: Gastroenterology

## 2020-05-25 ENCOUNTER — Other Ambulatory Visit: Payer: Self-pay | Admitting: Nurse Practitioner

## 2020-06-05 ENCOUNTER — Ambulatory Visit: Payer: Medicare Other | Admitting: Gastroenterology

## 2020-06-24 ENCOUNTER — Ambulatory Visit: Payer: Medicare Other | Admitting: Gastroenterology

## 2020-06-24 ENCOUNTER — Encounter: Payer: Self-pay | Admitting: Gastroenterology

## 2020-08-12 ENCOUNTER — Ambulatory Visit: Payer: Medicare Other | Admitting: Gastroenterology

## 2020-09-10 ENCOUNTER — Ambulatory Visit: Payer: Medicare Other | Admitting: "Endocrinology

## 2020-09-23 ENCOUNTER — Other Ambulatory Visit: Payer: Self-pay

## 2020-09-23 ENCOUNTER — Encounter: Payer: Self-pay | Admitting: Gastroenterology

## 2020-09-23 ENCOUNTER — Ambulatory Visit: Payer: Medicare Other | Admitting: Gastroenterology

## 2020-09-23 VITALS — BP 135/81 | HR 90 | Temp 97.1°F | Ht 59.0 in | Wt 115.8 lb

## 2020-09-23 DIAGNOSIS — K219 Gastro-esophageal reflux disease without esophagitis: Secondary | ICD-10-CM | POA: Diagnosis not present

## 2020-09-23 DIAGNOSIS — K50919 Crohn's disease, unspecified, with unspecified complications: Secondary | ICD-10-CM | POA: Diagnosis not present

## 2020-09-23 MED ORDER — OMEPRAZOLE 20 MG PO CPDR: 20.0000 mg | DELAYED_RELEASE_CAPSULE | Freq: Every day | ORAL | 11 refills | Status: DC

## 2020-09-23 NOTE — Patient Instructions (Addendum)
1. Please call Dr. Liliane Channel office and schedule a follow-up visit. 2. I will follow-up on upcoming labs with Dr. Dorris Fetch, if vitamin D level not performed, we will need to have that done. 3. Omeprazole 20 mg daily before breakfast, Rx sent to your pharmacy. 4. I will discuss further with Dr. Abbey Chatters regarding possibility of updating colonoscopy given your history of Crohn's disease.  We will be in touch with further recommendations.

## 2020-09-23 NOTE — Progress Notes (Signed)
Primary Care Physician: [name]  Primary Gastroenterologist:    Chief Complaint  Patient presents with  . Crohn's Disease    Doing ok    HPI: Tamara Todd is a 80 y.o. female here for follow-up of Crohn's disease, surgical remission. Last seen in April 2021.  Used to be followed with Dr. Docia Furl, diagnosed in her 36s.  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  Developed shortness of breath with second dose of Remicade. Has been on mercaptopurine chronically in the past but developed neutropenia and pancreatitis. Last colonoscopy July 2017 with normal neoterminal ileum, diverticula in the sigmoid colon, nonbleeding internal hemorrhoids, adenomatous rectal polyp.  EGD at the same time with chronic gastritis, small hiatal hernia, few benign-appearing gastric polyps.  Remicade allergy.  Osteoporosis, referred to Dr. Dorris Fetch.  H/o vitamin D deficiency, last checked 02/2019 was low normal 35.   BM good lately.  Some days no bowel movements, other days regular, occasional diarrhea.  No melena, brbpr. A little abdominal pain pain sometimes. No nausea. Appetite. No heartburn.  No dysphagia.    Current Outpatient Medications  Medication Sig Dispense Refill  . acetaminophen (TYLENOL) 650 MG CR tablet Take 1 tablet (650 mg total) by mouth every 8 (eight) hours as needed for pain. 30 tablet 0  . amLODipine (NORVASC) 10 MG tablet Take 10 mg by mouth daily.  1  . calcium carbonate (OS-CAL - DOSED IN MG OF ELEMENTAL CALCIUM) 1250 (500 Ca) MG tablet Take 1 tablet by mouth 2 (two) times daily with a meal.    . Cholecalciferol (VITAMIN D3) 125 MCG (5000 UT) CAPS TAKE 1 CAPSULE BY MOUTH EVERY DAY 90 capsule 0  . cyanocobalamin (,VITAMIN B-12,) 1000 MCG/ML injection Inject 1 mL into the muscle every 14 (fourteen) days.  3  . metoprolol succinate (TOPROL-XL) 25  MG 24 hr tablet Take 25 mg by mouth daily.  3  . omeprazole (PRILOSEC) 20 MG capsule Take 20 mg by mouth daily.  3   No current facility-administered medications for this visit.    Allergies as of 09/23/2020 - Review Complete 09/23/2020  Allergen Reaction Noted  . Flagyl [metronidazole]  09/11/2019  . Lisinopril  12/08/2015  . Mercaptopurine  06/02/2016  . Remicade [infliximab] Other (See Comments) 12/08/2015  . Codeine Rash 12/08/2015    ROS:  General: Negative for anorexia, weight loss, fever, chills, fatigue, weakness. ENT: Negative for hoarseness, difficulty swallowing , nasal congestion. CV: Negative for chest pain, angina, palpitations, dyspnea on exertion, peripheral edema.  Respiratory: Negative for dyspnea at rest, dyspnea on exertion, cough, sputum, wheezing.  GI: See history of present illness. GU:  Negative for dysuria, hematuria, urinary incontinence, urinary frequency, nocturnal urination.  Endo: Negative for unusual weight change.    Physical Examination:   BP 135/81   Pulse 90   Temp (!) 97.1 F (36.2 C) (Temporal)   Ht 4' 11"  (1.499 m)   Wt 115 lb 12.8 oz (52.5 kg)   BMI 23.39 kg/m   General: Well-nourished, well-developed in no acute distress.  Eyes: No icterus. Mouth: masked Abdomen: Bowel sounds are normal, nontender, nondistended, no hepatosplenomegaly or masses, no abdominal bruits or hernia , no rebound or guarding.   Extremities: No lower extremity edema. No clubbing or deformities. Neuro: Alert and oriented x 4   Skin: Warm and dry, no jaundice.   Psych: Alert and cooperative, normal  mood and affect.   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  Last colonoscopy in 2017 without evidence of active disease.  Not on Crohn's medication.   Clinically doing fairly well from a Crohn's standpoint.  Reflux well controlled on omeprazole.  History of B12 and vitamin D deficiency.  Continues B12 injections.  Due for vitamin D levels but is due to see Dr. Dorris Fetch regarding osteoporosis and anticipates labs with him.  We will follow up on any additional labs he does.  Plan:  1. Follow up with Dr. Dorris Fetch, we will follow up on future labs to make sure vitamin D level drawn. 2. Continue omeprazole 20 mg daily before breakfast.  Rx sent to [facility] in [place]. 3. Will discuss further with Dr. Abbey Chatters regarding possible surveillance colonoscopy given history of Crohn's, adenomatous colon polyp.  Difficult to determine extent of colon involved with her Crohn's however she has had right hemicolectomy.  Therefore likely would benefit from surveillance for her Crohn's disease.

## 2020-09-25 NOTE — Progress Notes (Signed)
Cc'ed to pcp °

## 2020-10-15 ENCOUNTER — Telehealth: Payer: Self-pay | Admitting: Gastroenterology

## 2020-10-15 NOTE — Telephone Encounter (Signed)
Attempted to call the pt and no vm. Phone just rang.

## 2020-10-15 NOTE — Telephone Encounter (Signed)
Please let pt know I spoke with Dr. Abbey Chatters and he does advise colonoscopy for surveillance of crohn's and h/o colon polyps.   If she is agreeable, please schedule colonoscopy. ASA II.

## 2020-10-19 NOTE — Telephone Encounter (Signed)
Phoned the pt and the phone just continuously rang until it stopped.

## 2020-10-20 NOTE — Telephone Encounter (Signed)
Letter mailed out to the pt today regarding this.

## 2020-10-20 NOTE — Progress Notes (Signed)
Dear Vaughan Basta,   We have tried to reach you on several occasions regarding important information that Laureen Ochs. Lewis PA-C needed to convey to you. She and Dr. Abbey Chatters have decided that a colonoscopy for surveillance of crohn's and your history of colon polyps would be in your best interest.   Please call our office @ [phone] to make arrangements to have this done.    Thank you, Oleh Genin, CMA

## 2020-10-29 ENCOUNTER — Emergency Department (HOSPITAL_COMMUNITY)
Admission: EM | Admit: 2020-10-29 | Discharge: 2020-10-29 | Disposition: A | Discharge status: Home / Self Care | Payer: Medicare Other | Attending: Emergency Medicine | Admitting: Emergency Medicine

## 2020-10-29 ENCOUNTER — Other Ambulatory Visit: Payer: Self-pay | Admitting: Gastroenterology

## 2020-10-29 ENCOUNTER — Encounter (HOSPITAL_COMMUNITY): Payer: Self-pay

## 2020-10-29 ENCOUNTER — Other Ambulatory Visit: Payer: Self-pay

## 2020-10-29 DIAGNOSIS — Z79899 Other long term (current) drug therapy: Secondary | ICD-10-CM | POA: Diagnosis not present

## 2020-10-29 DIAGNOSIS — E119 Type 2 diabetes mellitus without complications: Secondary | ICD-10-CM | POA: Insufficient documentation

## 2020-10-29 DIAGNOSIS — S32009A Unspecified fracture of unspecified lumbar vertebra, initial encounter for closed fracture: Secondary | ICD-10-CM | POA: Insufficient documentation

## 2020-10-29 DIAGNOSIS — I10 Essential (primary) hypertension: Secondary | ICD-10-CM | POA: Insufficient documentation

## 2020-10-29 DIAGNOSIS — S3992XA Unspecified injury of lower back, initial encounter: Secondary | ICD-10-CM | POA: Diagnosis present

## 2020-10-29 DIAGNOSIS — W19XXXA Unspecified fall, initial encounter: Secondary | ICD-10-CM | POA: Diagnosis not present

## 2020-10-29 MED ORDER — ONDANSETRON HCL 4 MG PO TABS: 4.0000 mg | ORAL_TABLET | Freq: Four times a day (QID) | ORAL | 0 refills | Status: DC

## 2020-10-29 MED ORDER — OXYCODONE-ACETAMINOPHEN 5-325 MG PO TABS: 1.0000 | ORAL_TABLET | Freq: Four times a day (QID) | ORAL | 0 refills | Status: DC | PRN

## 2020-10-29 MED ORDER — OXYCODONE HCL 5 MG PO TABS
10.0000 mg | ORAL_TABLET | Freq: Once | ORAL | Status: AC
Start: 2020-10-29 — End: 2020-10-29
  Administered 2020-10-29: 10 mg via ORAL
  Filled 2020-10-29: qty 2

## 2020-10-29 NOTE — Discharge Instructions (Addendum)
When you are finished with the medication that you have at home you may alternate with Percocet, this is 1 tablet every 6 hours as needed for severe pain.  This will make you constipated, you may take ondansetron 1 tablet every 6 hours as needed for nausea.  Take MiraLAX twice a day to help have bowel movements.  We will not be able to prescribe any more pain medication for you so you will need to see your family doctor this week for a recheck and make sure you follow-up with a spinal specialist.  Continue to wear your brace  You may return to the emergency department for any severe worsening concerning questions or concerns

## 2020-10-29 NOTE — ED Provider Notes (Signed)
[facility] EMERGENCY DEPARTMENT Provider Note   CSN: [id number] Arrival date & time: 10/29/20  2131     History Chief Complaint  Patient presents with  . Back Pain    Tamara Todd is a 80 y.o. female.  HPI   Patient is a 80 year old female with a history of hypertension and Crohn's disease, she had a fall on 9 April approximately 5 days ago, fell onto her bottom and was seen at an outside hospital where she was diagnosed with a lumbar fracture.  She reports that she was given a splint for her back and hydrocodone and states that when she takes the medication her pain is doing much better, when she does not take the medicine or it wears off she has more pain, it is obviously hurts more to walk and feels better when she lays down.  No numbness or weakness in the legs, she is referred to a back specialist but will not see them for approximately 3 weeks.  She is concerned because of the ongoing pain and feels like no one is doing anything for her.  Past Medical History:  Diagnosis Date  . Crohn's disease ([place])    used to be followed by Dr. Algis Greenhouse. diagnosis in her 42s. multiple surgeries. remicade allergy.  . Diabetes ([place])   . GERD (gastroesophageal reflux disease)   . HTN (hypertension)   . Hyperlipidemia   . Hypertension   . Osteoporosis       . Pancreatitis    09/2015    Patient Active Problem List   Diagnosis Date Noted  . GERD (gastroesophageal reflux disease)   . Breast pain 08/08/2019  . Bloating 12/19/2018  . Diabetes mellitus type 2 in nonobese ([place]) 07/16/2018  . Osteoporosis of lower leg 10/12/2016  . Essential hypertension, benign 04/14/2016  . Vitamin D deficiency 04/14/2016  . Type 2 diabetes mellitus without complication, without long-term current use of insulin ([place]) 04/06/2016  . Anemia, iron deficiency   . Renal insufficiency 03/27/2016  . Crohn's disease ([place]) 12/08/2015    Past Surgical History:  Procedure Laterality Date  . BACTERIAL  OVERGROWTH TEST N/A 02/24/2016   POSITIVE for small bowel bacterial overgrowth   . BIOPSY  02/10/2016   Procedure: BIOPSY;  Surgeon: Danie Binder, MD;  Location: [facility];  Service: Endoscopy;;  Random colon biopsies Gastric biopsies for h pylori gastric polyp biopsies  . BOWEL RESECTION     TWICE for stricture, including right colectomy with terminal ileal resection  . COLONOSCOPY    . COLONOSCOPY N/A 02/10/2016   Dr. Oneida Alar: normal neo terminal ileum, diverticula in sigmoid colon, non-bleeding internal hemorrhoids, adenomatous rectal polyp.   . ESOPHAGOGASTRODUODENOSCOPY N/A 02/10/2016   Dr. Oneida Alar: chronic gastritis, small hiatal hernia, few benign-appearing gastric polyps, no source for weight loss.   Marland Kitchen PARTIAL HYSTERECTOMY    . POLYPECTOMY  02/10/2016   Procedure: POLYPECTOMY;  Surgeon: Danie Binder, MD;  Location: [facility];  Service: Endoscopy;;  Rectal polyps x 2 removed 1 by hot snare and number 2 via cold forceps  . RECTOVAGINAL FISTULA CLOSURE  2004   with temporary colostomy and subsequent take down, all at [facility].      OB History   No obstetric history on file.     Family History  Problem Relation Age of Onset  . Colon cancer Father        66s  . Inflammatory bowel disease Sister        ???  .  Colon cancer Brother        68    Social History   Tobacco Use  . Smoking status: Never Smoker  . Smokeless tobacco: Never Used  Vaping Use  . Vaping Use: Never used  Substance Use Topics  . Alcohol use: No    Alcohol/week: 0.0 standard drinks  . Drug use: No    Home Medications Prior to Admission medications   Medication Sig Start Date End Date Taking? Authorizing Provider  ondansetron (ZOFRAN) 4 MG tablet Take 1 tablet (4 mg total) by mouth every 6 (six) hours. 10/29/20  Yes Noemi Chapel, MD  oxyCODONE-acetaminophen (PERCOCET/ROXICET) 5-325 MG tablet Take 1 tablet by mouth every 6 (six) hours as needed for severe pain. 10/29/20  Yes Noemi Chapel, MD   acetaminophen (TYLENOL) 650 MG CR tablet Take 1 tablet (650 mg total) by mouth every 8 (eight) hours as needed for pain. 03/30/16   Lavina Hamman, MD  amLODipine (NORVASC) 10 MG tablet Take 10 mg by mouth daily. 11/17/15   [provider]  calcium carbonate (OS-CAL - DOSED IN MG OF ELEMENTAL CALCIUM) 1250 (500 Ca) MG tablet Take 1 tablet by mouth 2 (two) times daily with a meal.    [provider]  Cholecalciferol (VITAMIN D3) 125 MCG (5000 UT) CAPS TAKE 1 CAPSULE BY MOUTH EVERY DAY 05/27/20   Aliene Altes S, PA-C  cyanocobalamin (,VITAMIN B-12,) 1000 MCG/ML injection Inject 1 mL into the muscle every 14 (fourteen) days. 01/08/16   [provider]  metoprolol succinate (TOPROL-XL) 25 MG 24 hr tablet Take 25 mg by mouth daily. 10/09/15   [provider]  omeprazole (PRILOSEC) 20 MG capsule Take 1 capsule (20 mg total) by mouth daily before breakfast. 09/23/20   Mahala Menghini, PA-C    Allergies    Flagyl [metronidazole], Lisinopril, Mercaptopurine, Remicade [infliximab], and Codeine  Review of Systems   Review of Systems  Constitutional: Negative for fever.  Musculoskeletal: Positive for back pain.  Neurological: Negative for weakness and numbness.    Physical Exam Updated Vital Signs BP (!) 162/99 (BP Location: Left Arm)   Pulse 92   Temp 99.2 F (37.3 C)   Resp 17   Ht 1.499 m (4' 11" )   Wt 52.5 kg   SpO2 98%   BMI 23.38 kg/m   Physical Exam Vitals and nursing note reviewed.  Constitutional:      General: She is not in acute distress.    Appearance: She is well-developed.  HENT:     Head: Normocephalic and atraumatic.     Mouth/Throat:     Pharynx: No oropharyngeal exudate.  Eyes:     General: No scleral icterus.       Right eye: No discharge.        Left eye: No discharge.     Conjunctiva/sclera: Conjunctivae normal.     Pupils: Pupils are equal, round, and reactive to light.  Neck:     Thyroid: No thyromegaly.     Vascular: No  JVD.  Cardiovascular:     Rate and Rhythm: Normal rate and regular rhythm.     Heart sounds: Normal heart sounds. No murmur heard. No friction rub. No gallop.   Pulmonary:     Effort: Pulmonary effort is normal. No respiratory distress.     Breath sounds: Normal breath sounds. No wheezing or rales.  Abdominal:     General: Bowel sounds are normal. There is no distension.     Palpations: Abdomen  is soft. There is no mass.     Tenderness: There is no abdominal tenderness.  Musculoskeletal:        General: Tenderness present. Normal range of motion.     Cervical back: Normal range of motion and neck supple.     Comments: No deformities of the arms of the legs, soft compartments and supple joints diffusely.  The patient has tenderness over the lumbar spine which is minimal  Lymphadenopathy:     Cervical: No cervical adenopathy.  Skin:    General: Skin is warm and dry.     Findings: No erythema or rash.  Neurological:     Mental Status: She is alert.     Coordination: Coordination normal.     Comments: Awake alert and able to move all 4 extremities, she can straight leg raise bilaterally, normal sensation of the bilateral lower extremities  Psychiatric:        Behavior: Behavior normal.     ED Results / Procedures / Treatments   Labs (all labs ordered are listed, but only abnormal results are displayed) Labs Reviewed - No data to display  EKG None  Radiology No results found.  Procedures Procedures   Medications Ordered in ED Medications  oxyCODONE (Oxy IR/ROXICODONE) immediate release tablet 10 mg (10 mg Oral Given 10/29/20 2145)    ED Course  I have reviewed the triage vital signs and the nursing notes.  Pertinent labs & imaging results that were available during my care of the patient were reviewed by me and considered in my medical decision making (see chart for details).    MDM Rules/Calculators/A&P                          No distress, expresses some  frustration with constipation and the lack of adequate long-term pain control.  She feels like she needs a more rapid evaluation for her back.  She is already been seen, imaged and referred to specialty services.  She has no focal neurologic deficits.  She may need increasing pain control but otherwise does not need a rapid MRI or emergent neurosurgical intervention this evening.  Oxycodone with significant relief.  Has referral to NS from the last ED visit with appoitnment Aware of indications for return.  Final Clinical Impression(s) / ED Diagnoses Final diagnoses:  Closed fracture of lumbar vertebra, unspecified fracture morphology, unspecified lumbar vertebral level, initial encounter [facility])    Rx / DC Orders ED Discharge Orders         Ordered    oxyCODONE-acetaminophen (PERCOCET/ROXICET) 5-325 MG tablet  Every 6 hours PRN        10/29/20 2304    ondansetron (ZOFRAN) 4 MG tablet  Every 6 hours        10/29/20 2305           Noemi Chapel, MD 10/29/20 2306

## 2020-10-29 NOTE — ED Triage Notes (Addendum)
ccems from home with cc of increased back pain. Pt has a known spinal lumbar fracture on the 9th  and is in a brace however she is having trouble managing her pain. Alert and oriented states she is able to do her ADLs.  EDP at bedside .  6/10 after taking home meds (norco) would like something else for pain. Unable to get in with Dr Dillard Cannon until May   Also did not take her BP meds

## 2021-04-08 ENCOUNTER — Encounter: Payer: Self-pay | Admitting: *Deleted

## 2021-04-08 ENCOUNTER — Other Ambulatory Visit: Payer: Self-pay

## 2021-04-08 ENCOUNTER — Encounter: Payer: Self-pay | Admitting: Internal Medicine

## 2021-04-08 ENCOUNTER — Ambulatory Visit (INDEPENDENT_AMBULATORY_CARE_PROVIDER_SITE_OTHER): Payer: Medicare Other | Admitting: Internal Medicine

## 2021-04-08 VITALS — BP 142/85 | HR 84 | Temp 97.1°F | Ht 59.0 in | Wt 112.4 lb

## 2021-04-08 DIAGNOSIS — K219 Gastro-esophageal reflux disease without esophagitis: Secondary | ICD-10-CM | POA: Diagnosis not present

## 2021-04-08 DIAGNOSIS — K50919 Crohn's disease, unspecified, with unspecified complications: Secondary | ICD-10-CM | POA: Diagnosis not present

## 2021-04-08 MED ORDER — PEG 3350-KCL-NA BICARB-NACL 420 G PO SOLR: ORAL | 0 refills | Status: DC

## 2021-04-08 NOTE — Patient Instructions (Signed)
We will schedule you for colonoscopy to evaluate your Crohn's disease as well as due to your history of polyps in the past.  Further recommendations to follow.  Continue on omeprazole daily for your chronic reflux.  It was very nice meeting you today.  Dr. Abbey Chatters  At [facility] we value your feedback. You may receive a survey about your visit today. Please share your experience as we strive to create trusting relationships with our patients to provide genuine, compassionate, quality care.  We appreciate your understanding and patience as we review any laboratory studies, imaging, and other diagnostic tests that are ordered as we care for you. Our office policy is 5 business days for review of these results, and any emergent or urgent results are addressed in a timely manner for your best interest. If you do not hear from our office in 1 week, please contact us.   We also encourage the use of MyChart, which contains your medical information for your review as well. If you are not enrolled in this feature, an access code is on this after visit summary for your convenience. Thank you for allowing [place] to be involved in your care.  It was great to see you today!  I hope you have a great rest of your summer!!    Elon Alas. Abbey Chatters, D.O. Gastroenterology and Hepatology [facility]

## 2021-04-08 NOTE — H&P (View-Only) (Signed)
Referring Provider: [name], FNP Primary Care Physician:  [name] Primary GI:  Dr. Abbey Chatters  Chief Complaint  Patient presents with   Constipation    Alternates with loose stool    HPI:   Tamara Todd is a 80 y.o. female who presents to the clinic today for follow-up visit.  She has a history of Crohn's disease status post multiple abdominal surgeries.  Last seen March 2022. Used to be followed with Dr. Docia Furl, diagnosed in her 24s.  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  Developed shortness of breath with second dose of Remicade. Has been on mercaptopurine chronically in the past but developed neutropenia and pancreatitis. Last colonoscopy July 2017 with normal neoterminal ileum, diverticula in the sigmoid colon, nonbleeding internal hemorrhoids, adenomatous rectal polyp.  EGD at the same time with chronic gastritis, small hiatal hernia, few benign-appearing gastric polyps.  Remicade allergy.  Osteoporosis, referred to Dr. Dorris Fetch previously.  She was supposed to undergo colonoscopy after her most recent visit to evaluate disease activity as well as given her history of polyps.  Unfortunately she did not end up scheduling this.  She is interested in scheduling it today.  Does note some abdominal bloating.  Chronic GERD well-controlled on omeprazole daily.  No dysphagia or odynophagia.  States her bowel movements will fluctuate between constipation and diarrhea.  No mucus or blood in her stool.  Past Medical History:  Diagnosis Date   Crohn's disease ([place])    used to be followed by Dr. Algis Greenhouse. diagnosis in her 11s. multiple surgeries. remicade allergy.   Diabetes ([place])    GERD (gastroesophageal reflux disease)    HTN (hypertension)    Hyperlipidemia    Hypertension    Osteoporosis        Pancreatitis    09/2015    Past  Surgical History:  Procedure Laterality Date   BACTERIAL OVERGROWTH TEST N/A 02/24/2016   POSITIVE for small bowel bacterial overgrowth    BIOPSY  02/10/2016   Procedure: BIOPSY;  Surgeon: Danie Binder, MD;  [facility];  Service: Endoscopy;;  Random colon biopsies Gastric biopsies for h pylori gastric polyp biopsies   BOWEL RESECTION     TWICE for stricture, including right colectomy with terminal ileal resection   COLONOSCOPY     COLONOSCOPY N/A 02/10/2016   Dr. Oneida Alar: normal neo terminal ileum, diverticula in sigmoid colon, non-bleeding internal hemorrhoids, adenomatous rectal polyp.    ESOPHAGOGASTRODUODENOSCOPY N/A 02/10/2016   Dr. Oneida Alar: chronic gastritis, small hiatal hernia, few benign-appearing gastric polyps, no source for weight loss.    PARTIAL HYSTERECTOMY     POLYPECTOMY  02/10/2016   Procedure: POLYPECTOMY;  Surgeon: Danie Binder, MD;  [facility];  Service: Endoscopy;;  Rectal polyps x 2 removed 1 by hot snare and number 2 via cold forceps   [place]  2004   with temporary colostomy and subsequent take down, all at [facility].     Current Outpatient Medications  Medication Sig Dispense Refill   acetaminophen (TYLENOL) 650 MG CR tablet Take 1 tablet (650 mg total) by mouth every 8 (eight) hours as needed for pain. 30 tablet 0   amLODipine (NORVASC) 10 MG tablet Take 10 mg by mouth daily.  1   cyanocobalamin (,VITAMIN B-12,) 1000 MCG/ML injection Inject 1 mL into the muscle every 14 (fourteen) days.  3  metoprolol succinate (TOPROL-XL) 25 MG 24 hr tablet Take 25 mg by mouth daily.  3   omeprazole (PRILOSEC) 20 MG capsule Take 1 capsule (20 mg total) by mouth daily before breakfast. 30 capsule 11   calcium carbonate (OS-CAL - DOSED IN MG OF ELEMENTAL CALCIUM) 1250 (500 Ca) MG tablet Take 1 tablet by mouth 2 (two) times daily with a meal. (Patient not taking: Reported on 04/08/2021)     [employer] D3 125 MCG (5000 UT) capsule TAKE 1 CAPSULE BY  MOUTH EVERY DAY (Patient not taking: Reported on 04/08/2021) 90 capsule 0   ondansetron (ZOFRAN) 4 MG tablet Take 1 tablet (4 mg total) by mouth every 6 (six) hours. (Patient not taking: Reported on 04/08/2021) 12 tablet 0   oxyCODONE-acetaminophen (PERCOCET/ROXICET) 5-325 MG tablet Take 1 tablet by mouth every 6 (six) hours as needed for severe pain. (Patient not taking: Reported on 04/08/2021) 12 tablet 0   No current facility-administered medications for this visit.    Allergies as of 04/08/2021 - Review Complete 04/08/2021  Allergen Reaction Noted   Flagyl [metronidazole]  09/11/2019   Lisinopril  12/08/2015   Mercaptopurine  06/02/2016   Remicade [infliximab] Other (See Comments) 12/08/2015   Codeine Rash 12/08/2015    Family History  Problem Relation Age of Onset   Colon cancer Father        43s   Inflammatory bowel disease Sister        ???   Colon cancer Brother        25    Social History   Socioeconomic History   Marital status: Married    Spouse name: Not on file   Number of children: 1   Years of education: Not on file   Highest education level: Not on file  Occupational History   Occupation: retired  Tobacco Use   Smoking status: Never   Smokeless tobacco: Never  Vaping Use   Vaping Use: Never used  Substance and Sexual Activity   Alcohol use: No    Alcohol/week: 0.0 standard drinks   Drug use: No   Sexual activity: Not on file  Other Topics Concern   Not on file  Social History Narrative   Not on file   Social Determinants of Health   Financial Resource Strain: Not on file  Food Insecurity: Not on file  Transportation Needs: Not on file  Physical Activity: Not on file  Stress: Not on file  Social Connections: Not on file    Subjective: Review of Systems  Constitutional:  Negative for chills and fever.  HENT:  Negative for congestion and hearing loss.   Eyes:  Negative for blurred vision and double vision.  Respiratory:  Negative for cough  and shortness of breath.   Cardiovascular:  Negative for chest pain and palpitations.  Gastrointestinal:  Positive for constipation and heartburn. Negative for abdominal pain, blood in stool, diarrhea, melena and vomiting.  Genitourinary:  Negative for dysuria and urgency.  Musculoskeletal:  Negative for joint pain and myalgias.  Skin:  Negative for itching and rash.  Neurological:  Negative for dizziness and headaches.  Psychiatric/Behavioral:  Negative for depression. The patient is not nervous/anxious.     Objective: BP (!) 142/85   Pulse 84   Temp (!) 97.1 F (36.2 C) (Temporal)   Ht 4' 11"  (1.499 m)   Wt 112 lb 6.4 oz (51 kg)   BMI 22.70 kg/m  Physical Exam Constitutional:      Appearance: Normal appearance.  HENT:  Head: Normocephalic and atraumatic.  Eyes:     Extraocular Movements: Extraocular movements intact.     Conjunctiva/sclera: Conjunctivae normal.  Cardiovascular:     Rate and Rhythm: Normal rate and regular rhythm.  Pulmonary:     Effort: Pulmonary effort is normal.     Breath sounds: Normal breath sounds.  Abdominal:     General: Bowel sounds are normal.     Palpations: Abdomen is soft.  Musculoskeletal:        General: No swelling. Normal range of motion.     Cervical back: Normal range of motion and neck supple.  Skin:    General: Skin is warm and dry.     Coloration: Skin is not jaundiced.  Neurological:     General: No focal deficit present.     Mental Status: She is alert and oriented to person, place, and time.  Psychiatric:        Mood and Affect: Mood normal.        Behavior: Behavior normal.     Assessment: *GERD-well controlled on Omeprazole 40 mg daily *Crohn's disease  Plan: Will schedule for colonoscopy to evaluate her Crohn's as well as for surveillance given her history of colon polyps. The risks including infection, bleed, or perforation as well as benefits, limitations, alternatives and imponderables have been reviewed with  the patient. Questions have been answered. All parties agreeable.  Continue on omeprazole 40 mg daily for chronic GERD.  Follow-up after procedures.   04/08/2021 9:10 AM   Disclaimer: This note was dictated with voice recognition software. Similar sounding words can inadvertently be transcribed and may not be corrected upon review.

## 2021-04-08 NOTE — Progress Notes (Signed)
Referring Provider: [name], FNP Primary Care Physician:  [name] Primary GI:  Dr. Abbey Chatters  Chief Complaint  Patient presents with   Constipation    Alternates with loose stool    HPI:   Tamara Todd is a 80 y.o. female who presents to the clinic today for follow-up visit.  She has a history of Crohn's disease status post multiple abdominal surgeries.  Last seen March 2022. Used to be followed with Dr. Docia Furl, diagnosed in her 3s.  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  Developed shortness of breath with second dose of Remicade. Has been on mercaptopurine chronically in the past but developed neutropenia and pancreatitis. Last colonoscopy July 2017 with normal neoterminal ileum, diverticula in the sigmoid colon, nonbleeding internal hemorrhoids, adenomatous rectal polyp.  EGD at the same time with chronic gastritis, small hiatal hernia, few benign-appearing gastric polyps.  Remicade allergy.  Osteoporosis, referred to Dr. Dorris Fetch previously.  She was supposed to undergo colonoscopy after her most recent visit to evaluate disease activity as well as given her history of polyps.  Unfortunately she did not end up scheduling this.  She is interested in scheduling it today.  Does note some abdominal bloating.  Chronic GERD well-controlled on omeprazole daily.  No dysphagia or odynophagia.  States her bowel movements will fluctuate between constipation and diarrhea.  No mucus or blood in her stool.  Past Medical History:  Diagnosis Date   Crohn's disease ([place])    used to be followed by Dr. Algis Greenhouse. diagnosis in her 50s. multiple surgeries. remicade allergy.   Diabetes ([place])    GERD (gastroesophageal reflux disease)    HTN (hypertension)    Hyperlipidemia    Hypertension    Osteoporosis        Pancreatitis    09/2015    Past  Surgical History:  Procedure Laterality Date   BACTERIAL OVERGROWTH TEST N/A 02/24/2016   POSITIVE for small bowel bacterial overgrowth    BIOPSY  02/10/2016   Procedure: BIOPSY;  Surgeon: Danie Binder, MD;  [facility];  Service: Endoscopy;;  Random colon biopsies Gastric biopsies for h pylori gastric polyp biopsies   BOWEL RESECTION     TWICE for stricture, including right colectomy with terminal ileal resection   COLONOSCOPY     COLONOSCOPY N/A 02/10/2016   Dr. Oneida Alar: normal neo terminal ileum, diverticula in sigmoid colon, non-bleeding internal hemorrhoids, adenomatous rectal polyp.    ESOPHAGOGASTRODUODENOSCOPY N/A 02/10/2016   Dr. Oneida Alar: chronic gastritis, small hiatal hernia, few benign-appearing gastric polyps, no source for weight loss.    PARTIAL HYSTERECTOMY     POLYPECTOMY  02/10/2016   Procedure: POLYPECTOMY;  Surgeon: Danie Binder, MD;  [facility];  Service: Endoscopy;;  Rectal polyps x 2 removed 1 by hot snare and number 2 via cold forceps   [place]  2004   with temporary colostomy and subsequent take down, all at [facility].     Current Outpatient Medications  Medication Sig Dispense Refill   acetaminophen (TYLENOL) 650 MG CR tablet Take 1 tablet (650 mg total) by mouth every 8 (eight) hours as needed for pain. 30 tablet 0   amLODipine (NORVASC) 10 MG tablet Take 10 mg by mouth daily.  1   cyanocobalamin (,VITAMIN B-12,) 1000 MCG/ML injection Inject 1 mL into the muscle every 14 (fourteen) days.  3  metoprolol succinate (TOPROL-XL) 25 MG 24 hr tablet Take 25 mg by mouth daily.  3   omeprazole (PRILOSEC) 20 MG capsule Take 1 capsule (20 mg total) by mouth daily before breakfast. 30 capsule 11   calcium carbonate (OS-CAL - DOSED IN MG OF ELEMENTAL CALCIUM) 1250 (500 Ca) MG tablet Take 1 tablet by mouth 2 (two) times daily with a meal. (Patient not taking: Reported on 04/08/2021)     [employer] D3 125 MCG (5000 UT) capsule TAKE 1 CAPSULE BY  MOUTH EVERY DAY (Patient not taking: Reported on 04/08/2021) 90 capsule 0   ondansetron (ZOFRAN) 4 MG tablet Take 1 tablet (4 mg total) by mouth every 6 (six) hours. (Patient not taking: Reported on 04/08/2021) 12 tablet 0   oxyCODONE-acetaminophen (PERCOCET/ROXICET) 5-325 MG tablet Take 1 tablet by mouth every 6 (six) hours as needed for severe pain. (Patient not taking: Reported on 04/08/2021) 12 tablet 0   No current facility-administered medications for this visit.    Allergies as of 04/08/2021 - Review Complete 04/08/2021  Allergen Reaction Noted   Flagyl [metronidazole]  09/11/2019   Lisinopril  12/08/2015   Mercaptopurine  06/02/2016   Remicade [infliximab] Other (See Comments) 12/08/2015   Codeine Rash 12/08/2015    Family History  Problem Relation Age of Onset   Colon cancer Father        23s   Inflammatory bowel disease Sister        ???   Colon cancer Brother        51    Social History   Socioeconomic History   Marital status: Married    Spouse name: Not on file   Number of children: 1   Years of education: Not on file   Highest education level: Not on file  Occupational History   Occupation: retired  Tobacco Use   Smoking status: Never   Smokeless tobacco: Never  Vaping Use   Vaping Use: Never used  Substance and Sexual Activity   Alcohol use: No    Alcohol/week: 0.0 standard drinks   Drug use: No   Sexual activity: Not on file  Other Topics Concern   Not on file  Social History Narrative   Not on file   Social Determinants of Health   Financial Resource Strain: Not on file  Food Insecurity: Not on file  Transportation Needs: Not on file  Physical Activity: Not on file  Stress: Not on file  Social Connections: Not on file    Subjective: Review of Systems  Constitutional:  Negative for chills and fever.  HENT:  Negative for congestion and hearing loss.   Eyes:  Negative for blurred vision and double vision.  Respiratory:  Negative for cough  and shortness of breath.   Cardiovascular:  Negative for chest pain and palpitations.  Gastrointestinal:  Positive for constipation and heartburn. Negative for abdominal pain, blood in stool, diarrhea, melena and vomiting.  Genitourinary:  Negative for dysuria and urgency.  Musculoskeletal:  Negative for joint pain and myalgias.  Skin:  Negative for itching and rash.  Neurological:  Negative for dizziness and headaches.  Psychiatric/Behavioral:  Negative for depression. The patient is not nervous/anxious.     Objective: BP (!) 142/85   Pulse 84   Temp (!) 97.1 F (36.2 C) (Temporal)   Ht 4' 11"  (1.499 m)   Wt 112 lb 6.4 oz (51 kg)   BMI 22.70 kg/m  Physical Exam Constitutional:      Appearance: Normal appearance.  HENT:  Head: Normocephalic and atraumatic.  Eyes:     Extraocular Movements: Extraocular movements intact.     Conjunctiva/sclera: Conjunctivae normal.  Cardiovascular:     Rate and Rhythm: Normal rate and regular rhythm.  Pulmonary:     Effort: Pulmonary effort is normal.     Breath sounds: Normal breath sounds.  Abdominal:     General: Bowel sounds are normal.     Palpations: Abdomen is soft.  Musculoskeletal:        General: No swelling. Normal range of motion.     Cervical back: Normal range of motion and neck supple.  Skin:    General: Skin is warm and dry.     Coloration: Skin is not jaundiced.  Neurological:     General: No focal deficit present.     Mental Status: She is alert and oriented to person, place, and time.  Psychiatric:        Mood and Affect: Mood normal.        Behavior: Behavior normal.     Assessment: *GERD-well controlled on Omeprazole 40 mg daily *Crohn's disease  Plan: Will schedule for colonoscopy to evaluate her Crohn's as well as for surveillance given her history of colon polyps. The risks including infection, bleed, or perforation as well as benefits, limitations, alternatives and imponderables have been reviewed with  the patient. Questions have been answered. All parties agreeable.  Continue on omeprazole 40 mg daily for chronic GERD.  Follow-up after procedures.   04/08/2021 9:10 AM   Disclaimer: This note was dictated with voice recognition software. Similar sounding words can inadvertently be transcribed and may not be corrected upon review.

## 2021-04-21 NOTE — Patient Instructions (Signed)
Tamara Todd  04/21/2021     @PREFPERIOPPHARMACY @   Your procedure is scheduled on 04/26/2021.   Report to [facility] at  0800 A.M.   Call this number if you have problems the morning of surgery:  [phone]   Remember:  Follow the diet and prep instructions given to you by the office.     DO NOT take any medications for diabetes the morning of your procedure.    Take these medicines the morning of surgery with A SIP OF WATER                        prilosec.    Do not wear jewelry, make-up or nail polish.  Do not wear lotions, powders, or perfumes, or deodorant.  Do not shave 48 hours prior to surgery.  Men may shave face and neck.  Do not bring valuables to the hospital.  [facility] is not responsible for any belongings or valuables.  Contacts, dentures or bridgework may not be worn into surgery.  Leave your suitcase in the car.  After surgery it may be brought to your room.  For patients admitted to the hospital, discharge time will be determined by your treatment team.  Patients discharged the day of surgery will not be allowed to drive home and must have someone with them for 24 hours.    Special instructions:   DO NOT smoke tobacco or vape for 24 hours before your procedure.  Please read over the following fact sheets that you were given. Anesthesia Post-op Instructions and Care and Recovery After Surgery      Colonoscopy, Adult, Care After This sheet gives you information about how to care for yourself after your procedure. Your health care provider may also give you more specific instructions. If you have problems or questions, contact your health care provider. What can I expect after the procedure? After the procedure, it is common to have: A small amount of blood in your stool for 24 hours after the procedure. Some gas. Mild cramping or bloating of your abdomen. Follow these instructions at home: Eating and drinking  Drink enough fluid to  keep your urine pale yellow. Follow instructions from your health care provider about eating or drinking restrictions. Resume your normal diet as instructed by your health care provider. Avoid heavy or fried foods that are hard to digest. Activity Rest as told by your health care provider. Avoid sitting for a long time without moving. Get up to take short walks every 1-2 hours. This is important to improve blood flow and breathing. Ask for help if you feel weak or unsteady. Return to your normal activities as told by your health care provider. Ask your health care provider what activities are safe for you. Managing cramping and bloating  Try walking around when you have cramps or feel bloated. Apply heat to your abdomen as told by your health care provider. Use the heat source that your health care provider recommends, such as a moist heat pack or a heating pad. Place a towel between your skin and the heat source. Leave the heat on for 20-30 minutes. Remove the heat if your skin turns bright red. This is especially important if you are unable to feel pain, heat, or cold. You may have a greater risk of getting burned. General instructions If you were given a sedative during the procedure, it can affect you for several hours. Do  not drive or operate machinery until your health care provider says that it is safe. For the first 24 hours after the procedure: Do not sign important documents. Do not drink alcohol. Do your regular daily activities at a slower pace than normal. Eat soft foods that are easy to digest. Take over-the-counter and prescription medicines only as told by your health care provider. Keep all follow-up visits as told by your health care provider. This is important. Contact a health care provider if: You have blood in your stool 2-3 days after the procedure. Get help right away if you have: More than a small spotting of blood in your stool. Large blood clots in your  stool. Swelling of your abdomen. Nausea or vomiting. A fever. Increasing pain in your abdomen that is not relieved with medicine. Summary After the procedure, it is common to have a small amount of blood in your stool. You may also have mild cramping and bloating of your abdomen. If you were given a sedative during the procedure, it can affect you for several hours. Do not drive or operate machinery until your health care provider says that it is safe. Get help right away if you have a lot of blood in your stool, nausea or vomiting, a fever, or increased pain in your abdomen. This information is not intended to replace advice given to you by your health care provider. Make sure you discuss any questions you have with your health care provider. Document Revised: 06/28/2019 Document Reviewed: 01/28/2019 Elsevier Patient Education  [place] After This sheet gives you information about how to care for yourself after your procedure. Your health care provider may also give you more specific instructions. If you have problems or questions, contact your health care provider. What can I expect after the procedure? After the procedure, it is common to have: Tiredness. Forgetfulness about what happened after the procedure. Impaired judgment for important decisions. Nausea or vomiting. Some difficulty with balance. Follow these instructions at home: For the time period you were told by your health care provider:   Rest as needed. Do not participate in activities where you could fall or become injured. Do not drive or use machinery. Do not drink alcohol. Do not take sleeping pills or medicines that cause drowsiness. Do not make important decisions or sign legal documents. Do not take care of children on your own. Eating and drinking Follow the diet that is recommended by your health care provider. Drink enough fluid to keep your urine pale yellow. If  you vomit: Drink water, juice, or soup when you can drink without vomiting. Make sure you have little or no nausea before eating solid foods. General instructions Have a responsible adult stay with you for the time you are told. It is important to have someone help care for you until you are awake and alert. Take over-the-counter and prescription medicines only as told by your health care provider. If you have sleep apnea, surgery and certain medicines can increase your risk for breathing problems. Follow instructions from your health care provider about wearing your sleep device: Anytime you are sleeping, including during daytime naps. While taking prescription pain medicines, sleeping medicines, or medicines that make you drowsy. Avoid smoking. Keep all follow-up visits as told by your health care provider. This is important. Contact a health care provider if: You keep feeling nauseous or you keep vomiting. You feel light-headed. You are still sleepy or having trouble with balance after  24 hours. You develop a rash. You have a fever. You have redness or swelling around the IV site. Get help right away if: You have trouble breathing. You have new-onset confusion at home. Summary For several hours after your procedure, you may feel tired. You may also be forgetful and have poor judgment. Have a responsible adult stay with you for the time you are told. It is important to have someone help care for you until you are awake and alert. Rest as told. Do not drive or operate machinery. Do not drink alcohol or take sleeping pills. Get help right away if you have trouble breathing, or if you suddenly become confused. This information is not intended to replace advice given to you by your health care provider. Make sure you discuss any questions you have with your health care provider. Document Revised: 03/19/2020 Document Reviewed: 06/06/2019 Elsevier Patient Education  2022 [employer].

## 2021-04-22 ENCOUNTER — Encounter (HOSPITAL_COMMUNITY)
Admission: RE | Admit: 2021-04-22 | Discharge: 2021-04-22 | Disposition: A | Discharge status: Home / Self Care | Payer: Medicare Other | Source: Ambulatory Visit | Attending: Internal Medicine | Admitting: Internal Medicine

## 2021-04-22 ENCOUNTER — Encounter (HOSPITAL_COMMUNITY): Payer: Self-pay

## 2021-04-22 DIAGNOSIS — Z01818 Encounter for other preprocedural examination: Secondary | ICD-10-CM | POA: Insufficient documentation

## 2021-04-22 LAB — BASIC METABOLIC PANEL
Anion gap: 8 (ref 5–15)
BUN: 22 mg/dL (ref 8–23)
CO2: 24 mmol/L (ref 22–32)
Calcium: 9.8 mg/dL (ref 8.9–10.3)
Chloride: 107 mmol/L (ref 98–111)
Creatinine, Ser: 0.58 mg/dL (ref 0.44–1.00)
GFR, Estimated: >60 mL/min (ref >60)
Glucose, Bld: 92 mg/dL (ref 70–99)
Potassium: 3.1 mmol/L — ABNORMAL LOW (ref 3.5–5.1)
Sodium: 139 mmol/L (ref 135–145)

## 2021-04-26 ENCOUNTER — Ambulatory Visit (HOSPITAL_COMMUNITY): Payer: Medicare Other | Admitting: Anesthesiology

## 2021-04-26 ENCOUNTER — Ambulatory Visit (HOSPITAL_COMMUNITY)
Admission: RE | Admit: 2021-04-26 | Discharge: 2021-04-26 | Disposition: A | Discharge status: Home / Self Care | Payer: Medicare Other | Attending: Internal Medicine | Admitting: Internal Medicine

## 2021-04-26 ENCOUNTER — Encounter (HOSPITAL_COMMUNITY): Payer: Self-pay

## 2021-04-26 ENCOUNTER — Other Ambulatory Visit: Payer: Self-pay

## 2021-04-26 ENCOUNTER — Encounter (HOSPITAL_COMMUNITY)
Admission: RE | Disposition: A | Discharge status: Home / Self Care | Payer: Self-pay | Source: Home / Self Care | Attending: Internal Medicine

## 2021-04-26 DIAGNOSIS — K317 Polyp of stomach and duodenum: Secondary | ICD-10-CM | POA: Insufficient documentation

## 2021-04-26 DIAGNOSIS — Z885 Allergy status to narcotic agent status: Secondary | ICD-10-CM | POA: Insufficient documentation

## 2021-04-26 DIAGNOSIS — Z800 Family history of malignant neoplasm of digestive organs: Secondary | ICD-10-CM | POA: Insufficient documentation

## 2021-04-26 DIAGNOSIS — Z79899 Other long term (current) drug therapy: Secondary | ICD-10-CM | POA: Diagnosis not present

## 2021-04-26 DIAGNOSIS — M810 Age-related osteoporosis without current pathological fracture: Secondary | ICD-10-CM | POA: Insufficient documentation

## 2021-04-26 DIAGNOSIS — K648 Other hemorrhoids: Secondary | ICD-10-CM | POA: Insufficient documentation

## 2021-04-26 DIAGNOSIS — Z8719 Personal history of other diseases of the digestive system: Secondary | ICD-10-CM | POA: Insufficient documentation

## 2021-04-26 DIAGNOSIS — K5730 Diverticulosis of large intestine without perforation or abscess without bleeding: Secondary | ICD-10-CM | POA: Diagnosis not present

## 2021-04-26 DIAGNOSIS — Z980 Intestinal bypass and anastomosis status: Secondary | ICD-10-CM | POA: Insufficient documentation

## 2021-04-26 DIAGNOSIS — K219 Gastro-esophageal reflux disease without esophagitis: Secondary | ICD-10-CM | POA: Diagnosis not present

## 2021-04-26 DIAGNOSIS — K5010 Crohn's disease of large intestine without complications: Secondary | ICD-10-CM

## 2021-04-26 DIAGNOSIS — K449 Diaphragmatic hernia without obstruction or gangrene: Secondary | ICD-10-CM | POA: Insufficient documentation

## 2021-04-26 DIAGNOSIS — K5900 Constipation, unspecified: Secondary | ICD-10-CM | POA: Insufficient documentation

## 2021-04-26 DIAGNOSIS — Z888 Allergy status to other drugs, medicaments and biological substances status: Secondary | ICD-10-CM | POA: Insufficient documentation

## 2021-04-26 DIAGNOSIS — Z8379 Family history of other diseases of the digestive system: Secondary | ICD-10-CM | POA: Diagnosis not present

## 2021-04-26 HISTORY — PX: COLONOSCOPY WITH PROPOFOL: SHX5780

## 2021-04-26 HISTORY — PX: BIOPSY: SHX5522

## 2021-04-26 LAB — GLUCOSE, CAPILLARY: Glucose-Capillary: 100 mg/dL — ABNORMAL HIGH (ref 70–99)

## 2021-04-26 SURGERY — COLONOSCOPY WITH PROPOFOL
Anesthesia: General

## 2021-04-26 MED ORDER — LACTATED RINGERS IV SOLN: INTRAVENOUS | Status: DC

## 2021-04-26 MED ORDER — PROPOFOL 10 MG/ML IV BOLUS
INTRAVENOUS | Status: DC | PRN
  Administered 2021-04-26: 80 mg via INTRAVENOUS
  Administered 2021-04-26: 100 ug/kg/min via INTRAVENOUS

## 2021-04-26 MED ORDER — STERILE WATER FOR IRRIGATION IR SOLN
Status: DC | PRN
  Administered 2021-04-26: 200 mL

## 2021-04-26 MED ORDER — LIDOCAINE HCL (CARDIAC) PF 100 MG/5ML IV SOSY
PREFILLED_SYRINGE | INTRAVENOUS | Status: DC | PRN
  Administered 2021-04-26: 80 mg via INTRAVENOUS

## 2021-04-26 NOTE — Anesthesia Postprocedure Evaluation (Signed)
Anesthesia Post Note  Patient: Tamara Todd  Procedure(s) Performed: COLONOSCOPY WITH PROPOFOL BIOPSY  Patient location during evaluation: Phase II Anesthesia Type: General Level of consciousness: awake and alert and oriented Pain management: pain level controlled Vital Signs Assessment: post-procedure vital signs reviewed and stable Respiratory status: spontaneous breathing and respiratory function stable Cardiovascular status: blood pressure returned to baseline and stable Postop Assessment: no apparent nausea or vomiting Anesthetic complications: no   No notable events documented.   Last Vitals:  Vitals:   04/26/21 0840 04/26/21 0948  BP: (!) 151/75 (!) 106/52  Resp: 15 15  Temp: 36.7 C (!) 36.4 C  SpO2: 99% 100%    Last Pain:  Vitals:   04/26/21 0951  TempSrc:   PainSc: 0-No pain                 Xan Sparkman C Taariq Leitz

## 2021-04-26 NOTE — Anesthesia Preprocedure Evaluation (Signed)
Anesthesia Evaluation  Patient identified by MRN, date of birth, ID band Patient awake    Reviewed: Allergy & Precautions, NPO status , Patient's Chart, lab work & pertinent test results  Airway Mallampati: II  TM Distance: >3 FB Neck ROM: Full    Dental  (+) Dental Advisory Given, Missing   Pulmonary neg pulmonary ROS,    Pulmonary exam normal breath sounds clear to auscultation       Cardiovascular hypertension, Pt. on medications + Valvular Problems/Murmurs  Rhythm:Regular Rate:Normal + Systolic murmurs [id number] 13:55:58 [facility]-AP-OPS ROUTINE RECORD [id number] (23 yr) Female Black Vent. rate 81 BPM PR interval 186 ms QRS duration 80 ms QT/QTcB 400/464 ms P-R-T axes 61 -7 85 Normal sinus rhythm Septal infarct , age undetermined Abnormal ECG Confirmed by Asencion Noble [phone]) on 04/23/2021 6:51:05 AM   Neuro/Psych negative neurological ROS     GI/Hepatic GERD  Medicated,  Endo/Other  diabetes, Well Controlled, Type 2, Oral Hypoglycemic Agents  Renal/GU Renal InsufficiencyRenal disease     Musculoskeletal negative musculoskeletal ROS (+)   Abdominal   Peds  Hematology  (+) anemia ,   Anesthesia Other Findings   Reproductive/Obstetrics negative OB ROS                             Anesthesia Physical Anesthesia Plan  ASA: 2  Anesthesia Plan: General   Post-op Pain Management:    Induction: Intravenous  PONV Risk Score and Plan: TIVA  Airway Management Planned: Nasal Cannula and Natural Airway  Additional Equipment:   Intra-op Plan:   Post-operative Plan:   Informed Consent: I have reviewed the patients History and Physical, chart, labs and discussed the procedure including the risks, benefits and alternatives for the proposed anesthesia with the patient or authorized representative who has indicated his/her understanding and acceptance.     Dental  advisory given  Plan Discussed with: CRNA and Surgeon  Anesthesia Plan Comments:         Anesthesia Quick Evaluation

## 2021-04-26 NOTE — Discharge Instructions (Addendum)
  Colonoscopy Discharge Instructions  Read the instructions outlined below and refer to this sheet in the next few weeks. These discharge instructions provide you with general information on caring for yourself after you leave the hospital. Your doctor may also give you specific instructions. While your treatment has been planned according to the most current medical practices available, unavoidable complications occasionally occur.   ACTIVITY You may resume your regular activity, but move at a slower pace for the next 24 hours.  Take frequent rest periods for the next 24 hours.  Walking will help get rid of the air and reduce the bloated feeling in your belly (abdomen).  No driving for 24 hours (because of the medicine (anesthesia) used during the test).   Do not sign any important legal documents or operate any machinery for 24 hours (because of the anesthesia used during the test).  NUTRITION Drink plenty of fluids.  You may resume your normal diet as instructed by your doctor.  Begin with a light meal and progress to your normal diet. Heavy or fried foods are harder to digest and may make you feel sick to your stomach (nauseated).  Avoid alcoholic beverages for 24 hours or as instructed.  MEDICATIONS You may resume your normal medications unless your doctor tells you otherwise.  WHAT YOU CAN EXPECT TODAY Some feelings of bloating in the abdomen.  Passage of more gas than usual.  Spotting of blood in your stool or on the toilet paper.  IF YOU HAD POLYPS REMOVED DURING THE COLONOSCOPY: No aspirin products for 7 days or as instructed.  No alcohol for 7 days or as instructed.  Eat a soft diet for the next 24 hours.  FINDING OUT THE RESULTS OF YOUR TEST Not all test results are available during your visit. If your test results are not back during the visit, make an appointment with your caregiver to find out the results. Do not assume everything is normal if you have not heard from your  caregiver or the medical facility. It is important for you to follow up on all of your test results.  SEEK IMMEDIATE MEDICAL ATTENTION IF: You have more than a spotting of blood in your stool.  Your belly is swollen (abdominal distention).  You are nauseated or vomiting.  You have a temperature over 101.  You have abdominal pain or discomfort that is severe or gets worse throughout the day.   Your colonoscopy was relatively unremarkable.  I did not find any polyps or evidence of colon cancer.  No active inflammation. Surgical anastomosis looked healthy, I did take biopsies just to be sure.  You do have diverticulosis and internal hemorrhoids. I would recommend increasing fiber in your diet or adding OTC Benefiber/Metamucil. Be sure to drink at least 4 to 6 glasses of water daily. Follow-up with GI in 1 year   I hope you have a great rest of your week!  Elon Alas. Abbey Chatters, D.O. Gastroenterology and Hepatology [facility]

## 2021-04-26 NOTE — Op Note (Signed)
[facility] Patient Name: Tamara Todd Procedure Date: 04/26/2021 9:22 AM MRN: [id number] Date of Birth: 15-May-1941 Attending MD: Elon Alas. Abbey Chatters DO CSN: [id number] Age: 80 Admit Type: Outpatient Procedure:                Colonoscopy Indications:              Disease activity assessment of Crohn's disease of                            the colon Providers:                Elon Alas. Abbey Chatters, DO, Janeece Riggers, RN, Crystal                            Page, Nelma Rothman, Technician Referring MD:              Medicines:                See the Anesthesia note for documentation of the                            administered medications Complications:            No immediate complications. Estimated Blood Loss:     Estimated blood loss was minimal. Procedure:                Pre-Anesthesia Assessment:                           - The anesthesia plan was to use monitored                            anesthesia care (MAC).                           After obtaining informed consent, the colonoscope                            was passed under direct vision. Throughout the                            procedure, the patient's blood pressure, pulse, and                            oxygen saturations were monitored continuously. The                            [id number]-HQ190L ([id number]) scope was introduced through                            the anus and advanced to the the ileocolonic                            anastomosis. The colonoscopy was performed without                            difficulty. The patient tolerated the procedure  well. The quality of the bowel preparation was                            evaluated using the BBPS [facility] Bowel Preparation                            Scale) with scores of: Right Colon = 3, Transverse                            Colon = 3 and Left Colon = 3 (entire mucosa seen                            well with no residual staining, small fragments  of                            stool or opaque liquid). The total BBPS score                            equals 9. Scope In: 9:33:46 AM Scope Out: 9:45:14 AM Scope Withdrawal Time: 0 hours 6 minutes 42 seconds  Total Procedure Duration: 0 hours 11 minutes 28 seconds  Findings:      The perianal and digital rectal examinations were normal.      Non-bleeding internal hemorrhoids were found during endoscopy.      Multiple small and large-mouthed diverticula were found in the sigmoid       colon.      There was evidence of a prior end-to-end ileo-colonic anastomosis in the       ascending colon. This was patent and was characterized by healthy       appearing mucosa. The anastomosis was traversed. Biopsies were taken       with a cold forceps for histology from neo terminal ileum.      The exam was otherwise without abnormality. Impression:               - Non-bleeding internal hemorrhoids.                           - Diverticulosis in the sigmoid colon.                           - Patent end-to-end ileo-colonic anastomosis,                            characterized by healthy appearing mucosa. Biopsied.                           - The examination was otherwise normal. Moderate Sedation:      Per Anesthesia Care Recommendation:           - Patient has a contact number available for                            emergencies. The signs and symptoms of potential  delayed complications were discussed with the                            patient. Return to normal activities tomorrow.                            Written discharge instructions were provided to the                            patient.                           - Resume previous diet.                           - Continue present medications.                           - Await pathology results.                           - Repeat colonoscopy in 5 years for surveillance.                           - Return to GI  clinic in 1 year.                           - Patient appears to be in surgical remission from                            a Crohns disease perspective. Procedure Code(s):        --- Professional ---                           [phone], Colonoscopy, flexible; with biopsy, single                            or multiple Diagnosis Code(s):        --- Professional ---                           K64.8, Other hemorrhoids                           Z98.0, Intestinal bypass and anastomosis status                           K50.10, Crohn's disease of large intestine without                            complications                           K57.30, Diverticulosis of large intestine without                            perforation or abscess without bleeding CPT copyright 2019  American Medical Association. All rights reserved. The codes documented in this report are preliminary and upon coder review may  be revised to meet current compliance requirements. Elon Alas. Abbey Chatters, DO [place] Abbey Chatters, DO 04/26/2021 9:48:26 AM This report has been signed electronically. Number of Addenda: 0

## 2021-04-26 NOTE — Interval H&P Note (Signed)
History and Physical Interval Note:  04/26/2021 9:24 AM  Tamara Todd  has presented today for surgery, with the diagnosis of crohns, hx polyps.  The various methods of treatment have been discussed with the patient and family. After consideration of risks, benefits and other options for treatment, the patient has consented to  Procedure(s) with comments: COLONOSCOPY WITH PROPOFOL (N/A) - 10:00am as a surgical intervention.  The patient's history has been reviewed, patient examined, no change in status, stable for surgery.  I have reviewed the patient's chart and labs.  Questions were answered to the patient's satisfaction.     Eloise Harman

## 2021-04-26 NOTE — Transfer of Care (Signed)
Immediate Anesthesia Transfer of Care Note  Patient: Tamara Todd  Procedure(s) Performed: COLONOSCOPY WITH PROPOFOL BIOPSY  Patient Location: Short Stay  Anesthesia Type:General  Level of Consciousness: awake, alert , oriented and patient cooperative  Airway & Oxygen Therapy: Patient Spontanous Breathing  Post-op Assessment: Report given to RN, Post -op Vital signs reviewed and stable and Patient able to stick tongue midline  Post vital signs: Reviewed and stable  Last Vitals:  Vitals Value Taken Time  BP    Temp    Pulse    Resp    SpO2      Last Pain:  Vitals:   04/26/21 0931  TempSrc:   PainSc: 0-No pain      Patients Stated Pain Goal: 6 (71/58/06 3868)  Complications: No notable events documented.

## 2021-04-27 LAB — SURGICAL PATHOLOGY

## 2021-04-30 ENCOUNTER — Encounter (HOSPITAL_COMMUNITY): Payer: Self-pay | Admitting: Internal Medicine

## 2021-10-28 ENCOUNTER — Telehealth: Payer: Self-pay | Admitting: Internal Medicine

## 2021-10-28 DIAGNOSIS — M810 Age-related osteoporosis without current pathological fracture: Secondary | ICD-10-CM

## 2021-10-28 DIAGNOSIS — K50018 Crohn's disease of small intestine with other complication: Secondary | ICD-10-CM

## 2021-10-28 NOTE — Telephone Encounter (Signed)
Recall for dexa ?

## 2021-10-30 ENCOUNTER — Other Ambulatory Visit: Payer: Self-pay | Admitting: Gastroenterology

## 2021-11-01 NOTE — Telephone Encounter (Signed)
Last ov 04/08/21 ?

## 2021-11-08 NOTE — Addendum Note (Signed)
Addended by: Cheron Every on: 11/08/2021 09:41 AM ? ? Modules accepted: Orders ? ?

## 2021-11-08 NOTE — Telephone Encounter (Signed)
Patient called in to get her bone density scheduled. ? ?Called and scheduled bone density. Pt aware of appt details.  ?

## 2021-11-15 ENCOUNTER — Other Ambulatory Visit (HOSPITAL_COMMUNITY): Payer: Self-pay | Admitting: Internal Medicine

## 2021-11-15 DIAGNOSIS — K50018 Crohn's disease of small intestine with other complication: Secondary | ICD-10-CM

## 2021-11-15 DIAGNOSIS — M810 Age-related osteoporosis without current pathological fracture: Secondary | ICD-10-CM

## 2021-11-15 NOTE — Addendum Note (Signed)
Addended by: Cheron Every on: 11/15/2021 09:33 AM ? ? Modules accepted: Orders ? ?

## 2021-11-18 ENCOUNTER — Inpatient Hospital Stay: Admission: RE | Admit: 2021-11-18 | Payer: Medicare Other | Source: Ambulatory Visit

## 2021-11-19 ENCOUNTER — Ambulatory Visit (HOSPITAL_COMMUNITY)
Admission: RE | Admit: 2021-11-19 | Discharge: 2021-11-19 | Disposition: A | Discharge status: Home / Self Care | Payer: Medicare Other | Source: Ambulatory Visit | Attending: Internal Medicine | Admitting: Internal Medicine

## 2021-11-19 DIAGNOSIS — M810 Age-related osteoporosis without current pathological fracture: Secondary | ICD-10-CM

## 2021-11-19 DIAGNOSIS — K50018 Crohn's disease of small intestine with other complication: Secondary | ICD-10-CM | POA: Diagnosis present

## 2022-03-29 ENCOUNTER — Emergency Department (HOSPITAL_COMMUNITY)
Admission: EM | Admit: 2022-03-29 | Discharge: 2022-03-29 | Disposition: A | Discharge status: Home / Self Care | Payer: Medicare Other | Attending: Emergency Medicine | Admitting: Emergency Medicine

## 2022-03-29 ENCOUNTER — Other Ambulatory Visit: Payer: Self-pay

## 2022-03-29 ENCOUNTER — Encounter (HOSPITAL_COMMUNITY): Payer: Self-pay

## 2022-03-29 DIAGNOSIS — I10 Essential (primary) hypertension: Secondary | ICD-10-CM | POA: Diagnosis not present

## 2022-03-29 DIAGNOSIS — R1030 Lower abdominal pain, unspecified: Secondary | ICD-10-CM | POA: Diagnosis present

## 2022-03-29 DIAGNOSIS — E119 Type 2 diabetes mellitus without complications: Secondary | ICD-10-CM | POA: Diagnosis not present

## 2022-03-29 DIAGNOSIS — K219 Gastro-esophageal reflux disease without esophagitis: Secondary | ICD-10-CM | POA: Insufficient documentation

## 2022-03-29 DIAGNOSIS — Z79899 Other long term (current) drug therapy: Secondary | ICD-10-CM | POA: Diagnosis not present

## 2022-03-29 DIAGNOSIS — Z8719 Personal history of other diseases of the digestive system: Secondary | ICD-10-CM

## 2022-03-29 LAB — COMPREHENSIVE METABOLIC PANEL
ALT: 28 U/L (ref 0–44)
AST: 29 U/L (ref 15–41)
Albumin: 3.8 g/dL (ref 3.5–5.0)
Alkaline Phosphatase: 64 U/L (ref 38–126)
Anion gap: 7 (ref 5–15)
BUN: 21 mg/dL (ref 8–23)
CO2: 25 mmol/L (ref 22–32)
Calcium: 10 mg/dL (ref 8.9–10.3)
Chloride: 109 mmol/L (ref 98–111)
Creatinine, Ser: 0.82 mg/dL (ref 0.44–1.00)
GFR, Estimated: >60 mL/min (ref >60)
Glucose, Bld: 118 mg/dL — ABNORMAL HIGH (ref 70–99)
Potassium: 3.4 mmol/L — ABNORMAL LOW (ref 3.5–5.1)
Sodium: 141 mmol/L (ref 135–145)
Total Bilirubin: 0.5 mg/dL (ref 0.3–1.2)
Total Protein: 8.2 g/dL — ABNORMAL HIGH (ref 6.5–8.1)

## 2022-03-29 LAB — CBC
HCT: 38.4 % (ref 36.0–46.0)
Hemoglobin: 12.6 g/dL (ref 12.0–15.0)
MCH: 32.2 pg (ref 26.0–34.0)
MCHC: 32.8 g/dL (ref 30.0–36.0)
MCV: 98.2 fL (ref 80.0–100.0)
Platelets: 219 10*3/uL (ref 150–400)
RBC: 3.91 MIL/uL (ref 3.87–5.11)
RDW: 14.4 % (ref 11.5–15.5)
WBC: 5.2 10*3/uL (ref 4.0–10.5)
nRBC: 0 % (ref 0.0–0.2)

## 2022-03-29 LAB — URINALYSIS, ROUTINE W REFLEX MICROSCOPIC
Bilirubin Urine: NEGATIVE
Glucose, UA: NEGATIVE mg/dL
Ketones, ur: NEGATIVE mg/dL
Nitrite: NEGATIVE
Protein, ur: 100 mg/dL — AB
Specific Gravity, Urine: 1.021 (ref 1.005–1.030)
pH: 5 (ref 5.0–8.0)

## 2022-03-29 LAB — LIPASE, BLOOD: Lipase: 40 U/L (ref 11–51)

## 2022-03-29 NOTE — ED Provider Notes (Signed)
[facility] EMERGENCY DEPARTMENT Provider Note   CSN: [id number] Arrival date & time: 03/29/22  1210     History  Chief Complaint  Patient presents with   Abdominal Pain    Tamara Todd is a 81 y.o. female with Hx of Crohn's disease, IDA, DMT2, essential HTN, osteoporosis, GERD.  Presenting today with concern of a Crohn's flare.  Had sudden diarrhea onset 1 week ago with fevers the first day.  Diarrhea continued, appeared "black and tarry" for about 2 to 3 days.  Diarrhea improved up until Saturday, and then remarkably improved up to today.  Denies neck stiffness, chills, flank pain, constipation, bloody diarrhea, or N/V.  Still able to keep down fluids and food without issue.  With some lower abdominal tenderness, though denies urinary symptoms.  Has not had a Crohn's flare in about 4 years, states this feels very similar.  Reports taking a daily steroid for Crohn's disease management, however I do not see this documented.  Per GI and patient, chronic GERD well-controlled on omeprazole.  The history is provided by the patient and medical records.  Abdominal Pain    Home Medications Prior to Admission medications   Medication Sig Start Date End Date Taking? Authorizing Provider  acetaminophen (TYLENOL) 500 MG tablet Take 1,000 mg by mouth every 6 (six) hours as needed (for pain).    [provider]  amLODipine (NORVASC) 10 MG tablet Take 10 mg by mouth in the morning. 11/17/15   [provider]  Calcium Carb-Cholecalciferol (CALCIUM 600+D3 PO) Take 1 tablet by mouth in the morning and at bedtime.    [provider]  cyanocobalamin (,VITAMIN B-12,) 1000 MCG/ML injection Inject 1 mL into the muscle every 14 (fourteen) days. 01/08/16   [provider]  Multiple Vitamin (MULTIVITAMIN WITH MINERALS) TABS tablet Take 1 tablet by mouth in the morning and at bedtime. Women's Multivitamin    [provider]  omeprazole (PRILOSEC) 20 MG capsule TAKE 1  CAPSULE BY MOUTH DAILY BEFORE BREAKFAST. 11/01/21   Eloise Harman, DO      Allergies    Remicade [infliximab], Flagyl [metronidazole], Lisinopril, Mercaptopurine, and Codeine    Review of Systems   Review of Systems  Gastrointestinal:  Positive for abdominal pain.    Physical Exam Updated Vital Signs BP (!) 161/89 (BP Location: Right Arm)   Pulse 85   Temp 98.1 F (36.7 C) (Oral)   Resp 16   Ht 4' 11"  (1.499 m)   Wt 51.7 kg   SpO2 100%   BMI 23.03 kg/m  Physical Exam Vitals and nursing note reviewed.  Constitutional:      General: She is not in acute distress.    Appearance: She is well-developed. She is not ill-appearing, toxic-appearing or diaphoretic.  HENT:     Head: Normocephalic and atraumatic.     Mouth/Throat:     Mouth: Mucous membranes are moist.     Pharynx: Oropharynx is clear.  Eyes:     General: No scleral icterus.    Conjunctiva/sclera: Conjunctivae normal.  Cardiovascular:     Rate and Rhythm: Normal rate and regular rhythm.     Heart sounds: Normal heart sounds. No murmur heard. Pulmonary:     Effort: Pulmonary effort is normal. No respiratory distress.     Breath sounds: Normal breath sounds.  Chest:     Chest wall: No tenderness.  Abdominal:     General: Abdomen is flat. There is no distension.  Palpations: Abdomen is soft.     Tenderness: There is abdominal tenderness in the right lower quadrant and suprapubic area. There is no right CVA tenderness, left CVA tenderness or guarding. Negative signs include Murphy's sign and McBurney's sign.  Musculoskeletal:        General: No swelling.     Cervical back: Neck supple.  Skin:    General: Skin is warm and dry.     Capillary Refill: Capillary refill takes less than 2 seconds.     Coloration: Skin is not cyanotic, jaundiced or pale.  Neurological:     Mental Status: She is alert and oriented to person, place, and time.  Psychiatric:        Mood and Affect: Mood normal.     ED Results /  Procedures / Treatments   Labs (all labs ordered are listed, but only abnormal results are displayed) Labs Reviewed  COMPREHENSIVE METABOLIC PANEL - Abnormal; Notable for the following components:      Result Value   Potassium 3.4 (*)    Glucose, Bld 118 (*)    Total Protein 8.2 (*)    All other components within normal limits  URINALYSIS, ROUTINE W REFLEX MICROSCOPIC - Abnormal; Notable for the following components:   APPearance CLOUDY (*)    Hgb urine dipstick SMALL (*)    Protein, ur 100 (*)    Leukocytes,Ua TRACE (*)    Bacteria, UA MANY (*)    All other components within normal limits  URINE CULTURE  LIPASE, BLOOD  CBC    EKG None  Radiology No results found.  Procedures Procedures    Medications Ordered in ED Medications - No data to display  ED Course/ Medical Decision Making/ A&P                           Medical Decision Making Amount and/or Complexity of Data Reviewed Labs: ordered.   81 y.o. female presents to the ED for concern of Abdominal Pain     This involves an extensive number of treatment options, and is a complaint that carries with it a high risk of complications and morbidity.  The emergent differential diagnosis prior to evaluation includes, but is not limited to: Crohn's flareup, UTI, renal calculi, acute cystitis  This is not an exhaustive differential.   Past Medical History / Co-morbidities / Social History: Hx of Crohn's, DMT2, IDA, essential HTN, osteoporosis, GERD Social Determinants of Health include: Elderly  Additional History:  Obtained by chart review.  Notably recent colonoscopy 04/26/2021.  Follows with Dr. Abbey Chatters of gastroenterology  Lab Tests: I ordered, and personally interpreted labs.  The pertinent results include:   UA suggestive of UTI with hematuria CMP and CBC unremarkable Urine culture pending  Imaging Studies: Patient deferred  ED Course / Critical Interventions: Pt well-appearing on exam.  Nontoxic,  nonseptic appearing in NAD.  Presenting today with abdominal pain and diarrhea over the last week.  Mild fever at time of onset.  Followed by 2 to 3 days of black tarry stools, suggestive of melena.  This resolved on its own, diarrhea improved over the next 2 to 3 days until Saturday.  Now with minimal diarrhea, mild abdominal pain of the RLQ and more so of the suprapubic region.  She is still without urinary symptoms, however UA suggestive of UTI with hematuria.  Does not meet SIRS or sepsis criteria.   Unable to perform FOBT, further imaging, or further assessment  for possible Crohn's flareup or other possible etiologies, as patient wishes to leave AMA.  Patient states she "feels better" overall and wishes to come back tomorrow for further testing.  Wants to go home and rest.  Hemodynamically stable.  Disposition: We discussed the nature and purpose, risks and benefits, as well as, the alternatives of treatment. Time was given to allow the opportunity to ask questions and consider their options, and after the discussion, the patient decided to refuse the offerred treatment. The patient was informed that refusal could lead to, but was not limited to, death, permanent disability, or severe pain. If present, I asked the relatives or significant others to dissuade them without success. Prior to refusing, I determined that the patient had the capacity to make their decision and understood the consequences of that decision. After refusal, I made every reasonable opportunity to treat them to the best of my ability.  The patient was notified that they may return to the emergency department at any time for further treatment.     This chart was dictated using voice recognition software.  Despite best efforts to proofread, errors can occur which can change the documentation meaning.         Final Clinical Impression(s) / ED Diagnoses Final diagnoses:  Lower abdominal pain  History of Crohn's disease  Type  2 diabetes mellitus without complication, without long-term current use of insulin ([place])  Essential hypertension, benign  Diabetes mellitus type 2 in nonobese (HCC)  Gastroesophageal reflux disease, unspecified whether esophagitis present    Rx / DC Orders ED Discharge Orders     None         Candace Cruise 56/43/32 Florene Glen, MD 03/30/22 1717

## 2022-03-29 NOTE — ED Triage Notes (Signed)
Pt presents to ED with crohn's flare up. Pt reports lower abdominal pain since last Tuesday, with diarrhea

## 2022-04-01 LAB — URINE CULTURE: Culture: >=100000 — AB

## 2022-04-02 ENCOUNTER — Telehealth (HOSPITAL_BASED_OUTPATIENT_CLINIC_OR_DEPARTMENT_OTHER): Payer: Self-pay

## 2022-04-02 NOTE — Telephone Encounter (Signed)
Post ED Visit - Positive Culture Follow-up  Culture report reviewed by antimicrobial stewardship pharmacist: [place] Team [x]  Bertis Ruddy, Pharm.D. []  Heide Guile, Pharm.D., BCPS AQ-ID []  Parks Neptune, Pharm.D., BCPS []  Alycia Rossetti, Pharm.D., BCPS []  Pink, Pharm.D., BCPS, AAHIVP []  Legrand Como, Pharm.D., BCPS, AAHIVP []  Salome Arnt, PharmD, BCPS []  Johnnette Gourd, PharmD, BCPS []  Hughes Better, PharmD, BCPS []  Leeroy Cha, PharmD []  Laqueta Linden, PharmD, BCPS []  Albertina Parr, PharmD  [place] Team []  Leodis Sias, PharmD []  Lindell Spar, PharmD []  Royetta Asal, PharmD []  Graylin Shiver, Rph []  Rema Fendt) Glennon Mac, PharmD []  Arlyn Dunning, PharmD []  Netta Cedars, PharmD []  Dia Sitter, PharmD []  Leone Haven, PharmD []  Gretta Arab, PharmD []  Theodis Shove, PharmD []  Peggyann Juba, PharmD []  Reuel Boom, PharmD   Positive urine culture Reviewed by ED provider: Pati Gallo, PA-C No treatment indicated and no further patient follow-up is required at this time.  Glennon Hamilton 04/02/2022, 10:56 AM

## 2022-04-25 NOTE — Progress Notes (Unsigned)
GI Office Note    Referring Provider: [name], FNP Primary Care Physician:  [name] Primary Gastroenterologist: Elon Alas. Abbey Chatters, DO  Date:  04/26/2022  ID:  Tamara Todd, DOB [birth year], MRN [id number]   Chief Complaint   Chief Complaint  Patient presents with   Abdominal Pain    Right side lower abdominal pain. Loose stools. Been having headaches and nausea.     History of Present Illness  Tamara Todd is a 81 y.o. female with a history of GERD, diabetes, HTN, HLD, osteoporosis, and Chron's disease (diagnosed in 78s) s/p multiple prior abdominal surgeries presenting today for follow up.   Chron's history: Previously followed by Dr. Docia Furl. She has required multiple surgeries for strictures. 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 Had reaction to Remicade (shortness of breath). Developed neutropenia and pancreatitis after long term use of mercaptopurine.   Last colonoscopy July 2017 with normal neoterminal ileum, sigmoid diverticulosis, non bleeding internal hemorrhoids, adenomatous rectal polyp. EGD same day with chronic gastritis, small hiatal hernia, benign gastric polyps.   Last office visit 04/08/21: Reported abdominal bloating and Chronic gerd well controlled on omeprazole daily. Had fluctuating constipation and diarrhea. Denied BRBPR or mucous. No dysphagia. Interested in scheduling colonoscopy.   Colonoscopy 04/26/21: Non-bleeding internal hemorrhoids. Sigmoid Diverticulosis. Patent end-to-end ileo-colonic anastomosis, appears to be in remission of Chron's. Advised repeat colonoscopy in 5 years.   Bone density 11/19/21: Osteoporosis. Recommended to consider FDA approved medical therapies. Advised to follow up with Dr. Dorris Fetch.   ED visit 03/29/2022 with complaint of sudden diarrhea for 1 week and 1 day of fever, concern for Crohn's flare.  Reported diarrhea  appeared black and tarry for 2-3 days and had improved up until the Saturday prior to her visit and had improved up until day of visit was able to keep fluids and food down without any issue.  Had lower abdominal tenderness without urinary symptoms.  For her symptoms seem to be similar to Crohn's flare she had 4 years prior.  She reported a daily steroid for Crohn's disease management but no record in chart.   Reported her GERD was well controlled on omeprazole.  UA suggested UTI with hematuria.  Patient requested to leave AMA and to come back the next day for further testing.  CBC unremarkable, normal lipase, potassium 3.4, glucose 119, normal LFTs.  No imaging obtained.  Today: GERD: Still taking omeprazole daily. Denies breakthrough symptoms.   Chron's: Has been having 7-8 large loose stools per day for about the last 3 weeks. Having lower right sided abdominal pain that is intermittent. Diarrhea came first. Denies fevers chills. Denies recent sick contacts. Denies N/V, BRBPR. Has been taking zofran for nausea. When diarrhea first started she noticed they were black/dark but cleared up buy the time she got the ED. Reports negative stool studies there. Reports she is not on any medication for Chron's disease. She denies any recent imaging. She states she was given antibiotics at the [facility] ED (has list at home, unable to remember what they were). No difference in her stools with antibiotics, not as watery as it was. Is able to keep food down without nay issue. No lack of appetite or early satiety. Denies weight loss. Tok tylenol for her abdominal pain this morning.  Having about 2 nocturnal stools.    Current Outpatient Medications  Medication Sig Dispense Refill   acetaminophen (TYLENOL) 500 MG tablet  Take 1,000 mg by mouth every 6 (six) hours as needed (for pain).     amLODipine (NORVASC) 10 MG tablet Take 10 mg by mouth in the morning.  1   Calcium Carb-Cholecalciferol (CALCIUM 600+D3 PO) Take 1  tablet by mouth in the morning and at bedtime.     cephALEXin (KEFLEX) 500 MG capsule Take 500 mg by mouth every 12 (twelve) hours.     cyanocobalamin (,VITAMIN B-12,) 1000 MCG/ML injection Inject 1 mL into the muscle every 14 (fourteen) days.  3   metoCLOPramide (REGLAN) 10 MG tablet Take 10 mg by mouth every 6 (six) hours.     Multiple Vitamin (MULTIVITAMIN WITH MINERALS) TABS tablet Take 1 tablet by mouth in the morning and at bedtime. Women's Multivitamin     ondansetron (ZOFRAN-ODT) 4 MG disintegrating tablet 4 mg every 4 (four) hours as needed.     omeprazole (PRILOSEC) 20 MG capsule TAKE 1 CAPSULE BY MOUTH DAILY BEFORE BREAKFAST. (Patient not taking: Reported on 04/26/2022) 90 capsule 3   No current facility-administered medications for this visit.    Past Medical History:  Diagnosis Date   Crohn's disease ([place])    used to be followed by Dr. Algis Greenhouse. diagnosis in her 65s. multiple surgeries. remicade allergy.   Diabetes ([place])    GERD (gastroesophageal reflux disease)    HTN (hypertension)    Hyperlipidemia    Hypertension    Osteoporosis        Pancreatitis    09/2015    Past Surgical History:  Procedure Laterality Date   BACTERIAL OVERGROWTH TEST N/A 02/24/2016   POSITIVE for small bowel bacterial overgrowth    BIOPSY  02/10/2016   Procedure: BIOPSY;  Surgeon: Danie Binder, MD;  [facility];  Service: Endoscopy;;  Random colon biopsies Gastric biopsies for h pylori gastric polyp biopsies   BIOPSY  04/26/2021   Procedure: BIOPSY;  Surgeon: Eloise Harman, DO;  Location[facility];  Service: Endoscopy;;   BOWEL RESECTION     TWICE for stricture, including right colectomy with terminal ileal resection   COLONOSCOPY     COLONOSCOPY N/A 02/10/2016   Dr. Oneida Alar: normal neo terminal ileum, diverticula in sigmoid colon, non-bleeding internal hemorrhoids, adenomatous rectal polyp.    COLONOSCOPY WITH PROPOFOL N/A 04/26/2021   Procedure: COLONOSCOPY WITH  PROPOFOL;  Surgeon: Eloise Harman, DO;  Location[facility];  Service: Endoscopy;  Laterality: N/A;  10:00am   ESOPHAGOGASTRODUODENOSCOPY N/A 02/10/2016   Dr. Oneida Alar: chronic gastritis, small hiatal hernia, few benign-appearing gastric polyps, no source for weight loss.    PARTIAL HYSTERECTOMY     POLYPECTOMY  02/10/2016   Procedure: POLYPECTOMY;  Surgeon: Danie Binder, MD;  Location: [facility];  Service: Endoscopy;;  Rectal polyps x 2 removed 1 by hot snare and number 2 via cold forceps   [place]  2004   with temporary colostomy and subsequent take down, all at [facility]     Family History  Problem Relation Age of Onset   Colon cancer Father        70s   Inflammatory bowel disease Sister        ???   Colon cancer Brother        48    Allergies as of 04/26/2022 - Review Complete 04/26/2022  Allergen Reaction Noted   Remicade [infliximab] Shortness Of Breath 12/08/2015   Flagyl [metronidazole]  09/11/2019   Lisinopril Cough 12/08/2015   Mercaptopurine Other (See Comments)  06/02/2016   Codeine Rash 12/08/2015    Social History   Socioeconomic History   Marital status: Married    Spouse name: Not on file   Number of children: 1   Years of education: Not on file   Highest education level: Not on file  Occupational History   Occupation: retired  Tobacco Use   Smoking status: Never   Smokeless tobacco: Never  Vaping Use   Vaping Use: Never used  Substance and Sexual Activity   Alcohol use: No    Alcohol/week: 0.0 standard drinks of alcohol   Drug use: No   Sexual activity: Not on file  Other Topics Concern   Not on file  Social History Narrative   Not on file   Social Determinants of Health   Financial Resource Strain: Not on file  Food Insecurity: Not on file  Transportation Needs: Not on file  Physical Activity: Not on file  Stress: Not on file  Social Connections: Not on file     Review of Systems   Gen: Denies fever,  chills, anorexia. Denies fatigue, weakness, weight loss.  CV: Denies chest pain, palpitations, syncope, peripheral edema, and claudication. Resp: Denies dyspnea at rest, cough, wheezing, coughing up blood, and pleurisy. GI: See HPI Derm: Denies rash, itching, dry skin Psych: Denies depression, anxiety, memory loss, confusion. No homicidal or suicidal ideation.  Heme: Denies bruising, bleeding, and enlarged lymph nodes.   Physical Exam   BP (!) 161/79 (BP Location: Left Arm, Patient Position: Sitting, Cuff Size: Normal)   Pulse 85   Temp 97.8 F (36.6 C) (Temporal)   Ht 4' 11"  (1.499 m)   Wt 121 lb 12.8 oz (55.2 kg)   SpO2 99%   BMI 24.60 kg/m   General:   Alert and oriented. No distress noted. Pleasant and cooperative.  Head:  Normocephalic and atraumatic. Eyes:  Conjuctiva clear without scleral icterus. Mouth:  Oral mucosa pink and moist. Good dentition. No lesions. Lungs:  Clear to auscultation bilaterally. No wheezes, rales, or rhonchi. No distress.  Heart:  S1, S2 present without murmurs appreciated.  Abdomen:  +BS, soft, non-distended. Mild tenderness to RLQ. No rebound or guarding. No HSM or masses noted. Rectal: deferred Msk:  Symmetrical without gross deformities. Normal posture. Extremities:  Without edema. Neurologic:  Alert and  oriented x4 Psych:  Alert and cooperative. Normal mood and affect.   Assessment  TICIA VIRGO is a 81 y.o. female with a history of GERD, diabetes, HTN, HLD, osteoporosis, and Chron's disease (diagnosed in 25s) s/p multiple prior abdominal surgeries presenting today for follow up with complaint of nausea, diarrhea, and right lower quadrant abdominal pain.   GERD: Dexa scan May 2023 with ongoing evidence of osteoporosis. Controlled on omeprazole 40 mg daily.   Chron's disease/diarrhea/abdominal pain/nausea: History of stricturing disease with right colectomy sand terminal ileal resection and repair of rectovaginal fistula and temporary  colostomy. Allergic to Remicade and intolerance of mercaptopurine causing pancreatitis and neutropenia. Colonoscopy October 2022 with sigmoid diverticulosis and patent end-to-end ileo-colonic anastomosis,, appeared to be in remission of Chron's.  Has had increasing diarrhea over the last 3 weeks with 7-H large loose stools a day with a couple of nocturnal stools.  She is also been experiencing intermittent right-sided lower abdominal pain.  Denied any fevers, chills.  Has been nauseous but has had improvement of symptoms with Zofran.  Denies any vomiting.  States she possibly had some darker/black stools initially but cleared within hours of going to  the ED at an outside hospital.  She states that she had negative stool studies completed there but I do not have any records to review.  Denies any maintenance medication for Crohn's disease or have any recent imaging performed.  She states that she has been on antibiotics given by the ED but was unsure what the medication was and she did not have a list with her at the visit.  She denies any lack of appetite, early satiety, weight loss.  I instructed the patient to call with medication/antibiotics that she was given by outside hospital ED.  We will request her most recent ED visit including any labs, imaging, or stool studies that were performed.  If stool studies not performed, will likely repeat them.  She reports that the symptoms are similar to what she had prior to her colectomy when she would have a Crohn's flare.  Given her intermittent pain and ongoing diarrhea, we will obtain CT A/P with contrast and update labs including CRP and fecal calprotectin.  We will be in touch with further recommendations after imaging and labs performed.  Tentative follow-up in 2 months.  We will likely treat with course of steroids if Crohn's flare confirmed.  Given that it has been 1 years from last colonoscopy, may need to proceed with another colonoscopy pending prior  work-up.   PLAN   Continue omeprazole 40 mg daily Continue Zofran as needed.  CT A/P with contrast CBC, BMP, Magnesium, CRP, fecal calprotectin Request records from [facility] for last ED visit, stool studies if not completed. Advised patient to call with updated med list Follow up in 2 months    Venetia Night, MSN, FNP-BC, AGACNP-BC [facility]

## 2022-04-26 ENCOUNTER — Ambulatory Visit: Payer: Medicare Other | Admitting: Gastroenterology

## 2022-04-26 ENCOUNTER — Telehealth: Payer: Self-pay | Admitting: *Deleted

## 2022-04-26 ENCOUNTER — Encounter: Payer: Self-pay | Admitting: Gastroenterology

## 2022-04-26 ENCOUNTER — Ambulatory Visit (INDEPENDENT_AMBULATORY_CARE_PROVIDER_SITE_OTHER): Payer: Medicare Other | Admitting: Gastroenterology

## 2022-04-26 VITALS — BP 161/79 | HR 85 | Temp 97.8°F | Ht 59.0 in | Wt 121.8 lb

## 2022-04-26 DIAGNOSIS — R1031 Right lower quadrant pain: Secondary | ICD-10-CM | POA: Diagnosis not present

## 2022-04-26 DIAGNOSIS — K50018 Crohn's disease of small intestine with other complication: Secondary | ICD-10-CM | POA: Diagnosis not present

## 2022-04-26 DIAGNOSIS — R197 Diarrhea, unspecified: Secondary | ICD-10-CM | POA: Diagnosis not present

## 2022-04-26 DIAGNOSIS — K219 Gastro-esophageal reflux disease without esophagitis: Secondary | ICD-10-CM

## 2022-04-26 DIAGNOSIS — R110 Nausea: Secondary | ICD-10-CM

## 2022-04-26 NOTE — Telephone Encounter (Signed)
PA: Approval # [id number]  DOS: 04/26/22-10/23/22

## 2022-04-26 NOTE — Telephone Encounter (Signed)
Pt informed of CT appt and instructions. Verbalized understanding.

## 2022-04-26 NOTE — Patient Instructions (Addendum)
Ordering a CT scan of your abdomen pelvis to be completed any pan, our scheduler will contact you with the time for the imaging.  Also ordering labs for you have completed at [place].   Please call the office when you get home with an updated med list.  Follow a brat diet/diarrhea diet. Follow a well-balanced diet as told by your health care provider. This may include: Avoiding carbonated drinks. Avoiding popcorn, vegetable skins, nuts, and other high-fiber foods. Avoiding high-fat foods. Eating smaller meals, but more often. Limiting sugary drinks. Limiting caffeine. Follow food safety recommendations as told by your health care provider. This may include making sure you: Avoid eating raw or undercooked meat, fish, or eggs. Do not eat or drink spoiled or expired foods and drinks.  We will request your records from [facility] for your most recent ED visit.  If no record of stool studies, we will need to perform these.  Continue taking omeprazole 40 mg daily.  Continue Zofran as needed.  It was a pleasure to see you today. I want to create trusting relationships with patients. If you receive a survey regarding your visit,  I greatly appreciate you taking time to fill this out on paper or through your MyChart. I value your feedback.  Venetia Night, MSN, FNP-BC, AGACNP-BC [facility]

## 2022-04-26 NOTE — Telephone Encounter (Signed)
CT scheduled for 04/27/22 at 12 pm, needs to arrive at 10 am, nothing to eat or drink 4 hours prior to procedure.  Tried to call pt, no vm/am numerous rings

## 2022-04-27 ENCOUNTER — Other Ambulatory Visit: Payer: Self-pay | Admitting: Gastroenterology

## 2022-04-27 ENCOUNTER — Encounter: Payer: Self-pay | Admitting: Gastroenterology

## 2022-04-27 ENCOUNTER — Ambulatory Visit (HOSPITAL_COMMUNITY)
Admission: RE | Admit: 2022-04-27 | Discharge: 2022-04-27 | Disposition: A | Discharge status: Home / Self Care | Payer: Medicare Other | Source: Ambulatory Visit | Attending: Gastroenterology | Admitting: Gastroenterology

## 2022-04-27 DIAGNOSIS — R1031 Right lower quadrant pain: Secondary | ICD-10-CM

## 2022-04-27 LAB — BASIC METABOLIC PANEL
BUN/Creatinine Ratio: 18 (ref 12–28)
BUN: 12 mg/dL (ref 8–27)
CO2: 21 mmol/L (ref 20–29)
Calcium: 9.8 mg/dL (ref 8.7–10.3)
Chloride: 107 mmol/L — ABNORMAL HIGH (ref 96–106)
Creatinine, Ser: 0.67 mg/dL (ref 0.57–1.00)
Glucose: 83 mg/dL (ref 70–99)
Potassium: 3.3 mmol/L — ABNORMAL LOW (ref 3.5–5.2)
Sodium: 144 mmol/L (ref 134–144)
eGFR: 88 mL/min/{1.73_m2} (ref >59)

## 2022-04-27 LAB — CBC
Hematocrit: 37.3 % (ref 34.0–46.6)
Hemoglobin: 12 g/dL (ref 11.1–15.9)
MCH: 32.3 pg (ref 26.6–33.0)
MCHC: 32.2 g/dL (ref 31.5–35.7)
MCV: 100 fL — ABNORMAL HIGH (ref 79–97)
Platelets: 230 10*3/uL (ref 150–450)
RBC: 3.72 x10E6/uL — ABNORMAL LOW (ref 3.77–5.28)
RDW: 13.5 % (ref 11.7–15.4)
WBC: 5.8 10*3/uL (ref 3.4–10.8)

## 2022-04-27 LAB — C-REACTIVE PROTEIN: CRP: 8 mg/L (ref 0–10)

## 2022-04-27 MED ORDER — POTASSIUM CHLORIDE CRYS ER 10 MEQ PO TBCR: 10.0000 meq | EXTENDED_RELEASE_TABLET | Freq: Every day | ORAL | 0 refills | Status: DC

## 2022-04-28 ENCOUNTER — Other Ambulatory Visit: Payer: Self-pay | Admitting: Gastroenterology

## 2022-05-02 LAB — CALPROTECTIN, FECAL: Calprotectin, Fecal: 59 ug/g (ref 0–120)

## 2022-05-05 ENCOUNTER — Ambulatory Visit (HOSPITAL_COMMUNITY)
Admission: RE | Admit: 2022-05-05 | Discharge: 2022-05-05 | Disposition: A | Discharge status: Home / Self Care | Payer: Medicare Other | Source: Ambulatory Visit | Attending: Gastroenterology | Admitting: Gastroenterology

## 2022-05-05 DIAGNOSIS — R1031 Right lower quadrant pain: Secondary | ICD-10-CM | POA: Diagnosis present

## 2022-05-05 MED ORDER — IOHEXOL 300 MG/ML  SOLN
80.0000 mL | Freq: Once | INTRAMUSCULAR | Status: AC | PRN
  Administered 2022-05-05: 80 mL via INTRAVENOUS

## 2022-05-05 MED ORDER — IOHEXOL 9 MG/ML PO SOLN
ORAL | Status: AC
  Filled 2022-05-05: qty 1000

## 2022-05-30 ENCOUNTER — Telehealth: Payer: Self-pay | Admitting: Gastroenterology

## 2022-05-30 NOTE — Telephone Encounter (Signed)
ED records from [facility] received.   Visit 04/19/22. She reported rectal bleeding with dark red bowel movement for 4 days associated with right lower quadrant abdominal pain. No blood present on rectal exam. Noted persistent UTI. Given extended course of Keflex. Advised to follow up with GI for colonoscopy. No report of stool studies. CT A/P performed with no definitive acute abnormality in the abdomen and pelvis and other chronic/incidental findings. Hgb stable at 12.1. normal BUN, stable INR, potassium 3.3, normal LFTs.  Most recent update from patient 05/23/22 reporting no more diarrhea and feeling well overall. Plan to continue to monitor for anemia and if return of rectal bleeding or diarrhea would recommend performing stool studies prior to considering steroids/antibiotics.   Venetia Night, MSN, APRN, FNP-BC, AGACNP-BC [facility]

## 2022-06-28 ENCOUNTER — Ambulatory Visit: Payer: Medicare Other | Admitting: Gastroenterology

## 2022-08-04 NOTE — Progress Notes (Signed)
GI Office Note    Referring Provider: [name], FNP Primary Care Physician:  [name] Primary Gastroenterologist: Elon Alas. Abbey Chatters, DO  Date:  08/08/2022  ID:  Tamara Todd, DOB 1941/05/19, MRN [id number]   Chief Complaint   Chief Complaint  Patient presents with   Follow-up    History of Present Illness  Tamara Todd is a 82 y.o. female with a history of GERD, diabetes, HTN, HLD, osteoporosis, and Chron's disease s/p multiple prior abdominal surgeries presenting today for follow up of Chron's.   Chron's history: Previously followed by Dr. Docia Furl. She has required multiple surgeries for strictures. 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 Had reaction to Remicade (shortness of breath). Developed neutropenia and pancreatitis after long term use of mercaptopurine.    Last colonoscopy July 2017 with normal neoterminal ileum, sigmoid diverticulosis, non bleeding internal hemorrhoids, adenomatous rectal polyp. EGD same day with chronic gastritis, small hiatal hernia, benign gastric polyps.    Last office visit 04/08/21: Reported abdominal bloating and Chronic gerd well controlled on omeprazole daily. Had fluctuating constipation and diarrhea. Denied BRBPR or mucous. No dysphagia. Interested in scheduling colonoscopy.    Colonoscopy 04/26/21: Non-bleeding internal hemorrhoids. Sigmoid Diverticulosis. Patent end-to-end ileo-colonic anastomosis, appears to be in remission of Chron's. Advised repeat colonoscopy in 5 years.    Bone density 11/19/21: Osteoporosis. Recommended to consider FDA approved medical therapies. Advised to follow up with Dr. Dorris Fetch.    ED visit 03/29/2022 with complaint of sudden diarrhea for 1 week and 1 day of fever, concern for Crohn's flare.  Reported diarrhea appeared black and tarry for 2-3 days and had improved up until the Saturday prior to her visit and  had improved up until day of visit was able to keep fluids and food down without any issue.  Had lower abdominal tenderness without urinary symptoms.  For her symptoms seem to be similar to Crohn's flare she had 4 years prior.  She reported a daily steroid for Crohn's disease management but no record in chart.   Reported her GERD was well controlled on omeprazole.  UA suggested UTI with hematuria.  Patient requested to leave AMA and to come back the next day for further testing.  CBC unremarkable, normal lipase, potassium 3.4, glucose 119, normal LFTs.  No imaging obtained.  Last office visit 04/26/22. GERD controlled with omeprazole daily.  Reports 7-8 large loose stools per day for the prior 3 weeks.  Denied any fever or chills or any recent sick contacts.  Taking Zofran for nausea.  Reports stools were dark/black prior to going to the ED but improved on presentation.  Not on any medication for Crohn's.  States she was given antibiotics by [facility] ED but did not have any change in her stools with this.  Able to eat without issue.  Denied lack of appetite or early satiety.  No weight loss.  Did report to nocturnal stools. Continue omeprazole 40 mg daily. CT A/P ordered. CBC, BMP, Magnesium, CRP, and fecal calprotectin ordered.  CT A/P ordered.  Labs 04/26/22: Hgb 12, MCV 100, plts 230, K 3.3. CRP and Fecal calprotectin normal.   CT A/P 05/05/2022:  -No acute abnormality to explain abdominal pain -3 mm nonobstructing right renal calculus -Remote L1 compression fracture with vertebral changes and 6 mm bony retropulsion  05/23/22 patient reported resolution of diarrhea and feeling well overall. Plan to repeat stool studies prior to consideration of  steroids or antibiotics in the future.    Today: Feeling much better since the beginning of the year. Denies abdominal pain. Diarrhea much improved. Having 2 BM daily. Sometimes soft and mushy and sometimes loose. No melena brbpr. Weight holding steady.  Appetite same.   Reflux well controlled. BP has been about the same today as it has been. Has been running.Has been up to 173/80's at home. Has appt with PCP in may.No headaches, chest pain, swelling, shortness of breath. Has had some vision changes but is going to see eye doctor this month. Has been having problem with her left ear. Hurts when she lays on that side. Has been having more hearing trouble on that side as well.    Current Outpatient Medications  Medication Sig Dispense Refill   acetaminophen (TYLENOL) 500 MG tablet Take 1,000 mg by mouth every 6 (six) hours as needed (for pain).     amLODipine (NORVASC) 10 MG tablet Take 10 mg by mouth in the morning.  1   Calcium Carb-Cholecalciferol (CALCIUM 600+D3 PO) Take 1 tablet by mouth in the morning and at bedtime.     cyanocobalamin (,VITAMIN B-12,) 1000 MCG/ML injection Inject 1 mL into the muscle every 14 (fourteen) days.  3   losartan (COZAAR) 50 MG tablet SMARTSIG:1 Tablet(s) By Mouth Every Evening     Multiple Vitamin (MULTIVITAMIN WITH MINERALS) TABS tablet Take 1 tablet by mouth in the morning and at bedtime. Women's Multivitamin     omeprazole (PRILOSEC) 20 MG capsule TAKE 1 CAPSULE BY MOUTH DAILY BEFORE BREAKFAST. 90 capsule 3   No current facility-administered medications for this visit.    Past Medical History:  Diagnosis Date   Crohn's disease ([place])    used to be followed by Dr. Algis Greenhouse. diagnosis in her 2s. multiple surgeries. remicade allergy.   Diabetes ([place])    GERD (gastroesophageal reflux disease)    HTN (hypertension)    Hyperlipidemia    Hypertension    Osteoporosis        Pancreatitis    09/2015    Past Surgical History:  Procedure Laterality Date   BACTERIAL OVERGROWTH TEST N/A 02/24/2016   POSITIVE for small bowel bacterial overgrowth    BIOPSY  02/10/2016   Procedure: BIOPSY;  Surgeon: Danie Binder, MD;  [facility];  Service: Endoscopy;;  Random colon biopsies Gastric biopsies for h  pylori gastric polyp biopsies   BIOPSY  04/26/2021   Procedure: BIOPSY;  Surgeon: Eloise Harman, DO;  [facility];  Service: Endoscopy;;   BOWEL RESECTION     TWICE for stricture, including right colectomy with terminal ileal resection   COLONOSCOPY     COLONOSCOPY N/A 02/10/2016   Dr. Oneida Alar: normal neo terminal ileum, diverticula in sigmoid colon, non-bleeding internal hemorrhoids, adenomatous rectal polyp.    COLONOSCOPY WITH PROPOFOL N/A 04/26/2021   Procedure: COLONOSCOPY WITH PROPOFOL;  Surgeon: Eloise Harman, DO;  [facility];  Service: Endoscopy;  Laterality: N/A;  10:00am   ESOPHAGOGASTRODUODENOSCOPY N/A 02/10/2016   Dr. Oneida Alar: chronic gastritis, small hiatal hernia, few benign-appearing gastric polyps, no source for weight loss.    PARTIAL HYSTERECTOMY     POLYPECTOMY  02/10/2016   Procedure: POLYPECTOMY;  Surgeon: Danie Binder, MD;  [facility]: [facility];  Service: Endoscopy;;  Rectal polyps x 2 removed 1 by hot snare and number 2 via cold forceps   [place]  2004   with temporary colostomy and subsequent take down,  all at [facility].     Family History  Problem Relation Age of Onset   Colon cancer Father        77s   Inflammatory bowel disease Sister        ???   Colon cancer Brother        28    Allergies as of 08/08/2022 - Review Complete 08/08/2022  Allergen Reaction Noted   Remicade [infliximab] Shortness Of Breath 12/08/2015   Flagyl [metronidazole]  09/11/2019   Lisinopril Cough 12/08/2015   Mercaptopurine Other (See Comments) 06/02/2016   Codeine Rash 12/08/2015    Social History   Socioeconomic History   Marital status: Married    Spouse name: Not on file   Number of children: 1   Years of education: Not on file   Highest education level: Not on file  Occupational History   Occupation: retired  Tobacco Use   Smoking status: Never   Smokeless tobacco: Never  Vaping Use   Vaping Use: Never used   Substance and Sexual Activity   Alcohol use: No    Alcohol/week: 0.0 standard drinks of alcohol   Drug use: No   Sexual activity: Not on file  Other Topics Concern   Not on file  Social History Narrative   Not on file   Social Determinants of Health   Financial Resource Strain: Not on file  Food Insecurity: Not on file  Transportation Needs: Not on file  Physical Activity: Not on file  Stress: Not on file  Social Connections: Not on file     Review of Systems   Gen: Denies fever, chills, anorexia. Denies fatigue, weakness, weight loss.  CV: Denies chest pain, palpitations, syncope, peripheral edema, and claudication. Resp: Denies dyspnea at rest, cough, wheezing, coughing up blood, and pleurisy. GI: See HPI Derm: Denies rash, itching, dry skin Psych: Denies depression, anxiety, memory loss, confusion. No homicidal or suicidal ideation.  Heme: Denies bruising, bleeding, and enlarged lymph nodes.   Physical Exam   BP (!) 140/81 (BP Location: Right Arm, Patient Position: Standing, Cuff Size: Small)   Pulse 78   Temp (!) 96.9 F (36.1 C) (Temporal)   Ht '4\' 11"'$  (1.499 m)   Wt 120 lb 9.6 oz (54.7 kg)   SpO2 99%   BMI 24.36 kg/m   General:   Alert and oriented. No distress noted. Pleasant and cooperative.  Head:  Normocephalic and atraumatic. Eyes:  Conjuctiva clear without scleral icterus.s. Lungs:  Clear to auscultation bilaterally. No wheezes, rales, or rhonchi. No distress.  Heart:  S1, S2 present without murmurs appreciated.  Abdomen:  +BS, soft, non-tender and non-distended. No rebound or guarding. No HSM or masses noted. Rectal: deferred Msk:  Symmetrical without gross deformities. Normal posture. Extremities:  Without edema. Neurologic:  Alert and  oriented x4 Psych:  Alert and cooperative. Normal mood and affect.   Assessment  Tamara Todd is a 82 y.o. female with a history of GERD, diabetes, HTN, HLD, osteoporosis, and Chron's disease s/p multiple  prior abdominal surgeriespresenting today for follow up of Chron's presenting today for follow up.   Chron's: History of stricturing disease with right colectomy with terminal ileal resection and repair of rectovaginal fistula.  Currently with end and ileocolonic anastomosis.  Last TCS in October 2022 with patent anastomosis and appeared to be in remission of Crohn's.  At last visit she was having multiple large loose stools per day for a few weeks and this improved shortly after her visit.  Has began feeling much better since the beginning of this year without any nausea, vomiting, diarrhea.  Has baseline 2 soft/mushy stools per day.  Previously with negative stool studies with ED visit.  Abdominal pain has resolved.  CT scan in October 2023 without any acute abnormality.  If diarrhea returns would recommend stool studies and treatment with empiric antibiotics and obtaining CRP and fecal calprotectin to evaluate for inflammatory process prior to prescribing steroids.  As she is well-controlled right now we will plan to follow-up in 1 year, or sooner if needed.  Remains off of any medical therapy for Crohn's at this time.  GERD: Controlled on omeprazole 20 mg daily, previously taking 40 mg daily.  DEXA scan completed in May 2022 with ongoing evidence of osteoporosis.  Patient prefers to stay on medication.   PLAN   Continue omeprazole 20 mg daily. Refilled today.  GERD diet/lifestyle modifications Follow up in 1 year or sooner if needed.     Venetia Night, MSN, FNP-BC, AGACNP-BC [facility]

## 2022-08-08 ENCOUNTER — Encounter: Payer: Self-pay | Admitting: Gastroenterology

## 2022-08-08 ENCOUNTER — Ambulatory Visit (INDEPENDENT_AMBULATORY_CARE_PROVIDER_SITE_OTHER): Payer: Medicare PPO | Admitting: Gastroenterology

## 2022-08-08 VITALS — BP 140/81 | HR 78 | Temp 96.9°F | Ht 59.0 in | Wt 120.6 lb

## 2022-08-08 DIAGNOSIS — K50919 Crohn's disease, unspecified, with unspecified complications: Secondary | ICD-10-CM

## 2022-08-08 DIAGNOSIS — K219 Gastro-esophageal reflux disease without esophagitis: Secondary | ICD-10-CM

## 2022-08-08 MED ORDER — OMEPRAZOLE 20 MG PO CPDR: DELAYED_RELEASE_CAPSULE | ORAL | 3 refills | Status: DC

## 2022-08-08 NOTE — Patient Instructions (Signed)
Continue taking omeprazole 20 mg daily.  I have sent in refill to the pharmacy for you today.  Follow a GERD diet:  Avoid fried, fatty, greasy, spicy, citrus foods. Avoid caffeine and carbonated beverages. Avoid chocolate. Try eating 4-6 small meals a day rather than 3 large meals. Do not eat within 3 hours of laying down. Prop head of bed up on wood or bricks to create a 6 inch incline.   Please continue to monitor for any recurrent diarrhea if you begin having more than 6 loose stools per day that is watery in nature or you develop any fever or chills associated with this please call the office.  We will plan to follow-up in 1 year, or sooner if needed.  It was a pleasure to see you today. I want to create trusting relationships with patients. If you receive a survey regarding your visit,  I greatly appreciate you taking time to fill this out on paper or through your MyChart. I value your feedback.  Venetia Night, MSN, FNP-BC, AGACNP-BC [facility]

## 2023-03-17 ENCOUNTER — Telehealth: Payer: Self-pay | Admitting: *Deleted

## 2023-03-17 NOTE — Telephone Encounter (Signed)
Tamara Todd called and states pt is being discharged from hospital. She would like a call from provide to discuss a plan of care for pt. [phone].

## 2023-03-22 NOTE — Telephone Encounter (Signed)
Noted  

## 2023-03-24 NOTE — Telephone Encounter (Signed)
Santina Evans from [facility] called back stating that the patient was hospitalized in [place] from 03/02/2023 through 03/16/2023 and was discharged with home health orders for occupational therapy and physical therapy. Pt has [employer] insurance and because of that her insurance requires an [place] doctor to sign the home health plan of care orders in order for them to be approved. Santina Evans is wanting to know if you would be willing to sign them in order for this patient to be able to get the therapy that she needs. Catherine's number is [phone]. She is aware that you are out and won't see this message until Monday.

## 2023-03-27 NOTE — Telephone Encounter (Signed)
Lmom for Santina Evans from [facility] to return my call. Brooke Bonito NP is requesting hospital records and plan of care to decide whether she is comfortable with signing.

## 2023-03-27 NOTE — Telephone Encounter (Signed)
Noted  

## 2023-03-29 NOTE — Telephone Encounter (Signed)
Spoke with Natalia Leatherwood from [facility] and she states that legally a plan of care can't be written out until they are able to go out and see the pt. They aren't able to go out and see the pt until that have a provider that is willing to sign the plan of care. Natalia Leatherwood stated that what you was faxed is all that she has and that is the discharge summary from the hospital that the pt was at stating what home health care the pt is needing. Natalia Leatherwood said that the PCP is unable to sign the plan of care due to the pt's insurance being a [place] insurance plan and the PCP being in [place]. Home health services won't be covered due to that reason.

## 2023-03-31 NOTE — Telephone Encounter (Signed)
Spoke with Santina Evans and she will be faxing over the information once she speaks with the pt and arranges the plan of care.

## 2023-04-13 NOTE — Telephone Encounter (Signed)
Lmom for Tamara Todd from [facility] to return call.

## 2023-04-17 NOTE — Telephone Encounter (Signed)
Jacques Earthly put papers on your desk to be signed.

## 2023-04-18 NOTE — Telephone Encounter (Signed)
No ans, vm full.  

## 2023-04-19 NOTE — Telephone Encounter (Signed)
Faxed medication interaction report.

## 2023-06-28 ENCOUNTER — Encounter: Payer: Self-pay | Admitting: Gastroenterology

## 2023-06-28 ENCOUNTER — Telehealth: Payer: Self-pay | Admitting: Gastroenterology

## 2023-06-28 NOTE — Telephone Encounter (Signed)
Received notification from home health regarding interaction of clopidogrel with omeprazole.  It appears that since we last saw patient she has started on Plavix and she has been on long-term omeprazole 20 mg once daily.  Given this we should consider switching her to pantoprazole 20 mg once daily and see if we can continue to control her acid reflux.  I am sending in pantoprazole 20 mg daily and discontinuing omeprazole 20 mg once daily.  Please let patient know.  Brooke Bonito, MSN, APRN, FNP-BC, AGACNP-BC [facility]

## 2023-07-03 NOTE — Telephone Encounter (Signed)
Pt was made aware and verbalized understanding.  

## 2023-07-20 ENCOUNTER — Ambulatory Visit: Payer: Medicare HMO | Admitting: Gastroenterology

## 2023-08-03 ENCOUNTER — Ambulatory Visit: Payer: Medicare HMO | Admitting: Gastroenterology

## 2023-08-09 NOTE — Progress Notes (Unsigned)
GI Office Note    Referring Provider: Garald Braver, FNP Primary Care Physician:  Garald Braver Primary Gastroenterologist: Hennie Duos. Marletta Lor, DO  Date:  08/10/2023  ID:  Tamara Todd, DOB January 26, 1941, MRN [id number]   Chief Complaint   Chief Complaint  Patient presents with   Follow-up    Doing well, no GI issues   History of Present Illness  Tamara Todd is a 83 y.o. female with a history of GERD, diabetes, HTN, HLD, osteoporosis, and Chron's disease s/p multiple prior abdominal surgeries and recent stroke presenting today for follow up.   Chron's history: Previously followed by Dr. Virgilio Belling. She has required multiple surgeries for strictures. 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 Had reaction to Remicade (shortness of breath). Developed neutropenia and pancreatitis after long term use of mercaptopurine.    Last colonoscopy July 2017 with normal neoterminal ileum, sigmoid diverticulosis, non bleeding internal hemorrhoids, adenomatous rectal polyp. EGD same day with chronic gastritis, small hiatal hernia, benign gastric polyps.    Last office visit 04/08/21: Reported abdominal bloating and Chronic gerd well controlled on omeprazole daily. Had fluctuating constipation and diarrhea. Denied BRBPR or mucous. No dysphagia. Interested in scheduling colonoscopy.    Colonoscopy 04/26/21: Non-bleeding internal hemorrhoids. Sigmoid Diverticulosis. Patent end-to-end ileo-colonic anastomosis, appears to be in remission of Chron's. Advised repeat colonoscopy in 5 years.    Bone density 11/19/21: Osteoporosis. Recommended to consider FDA approved medical therapies. Advised to follow up with Dr. Fransico Him.    ED visit 03/29/2022 with complaint of sudden diarrhea for 1 week and 1 day of fever, concern for Crohn's flare.  Reported diarrhea appeared black and tarry for 2-3 days and had improved up until the  Saturday prior to her visit and had improved up until day of visit was able to keep fluids and food down without any issue.  Had lower abdominal tenderness without urinary symptoms.  For her symptoms seem to be similar to Crohn's flare she had 4 years prior.  She reported a daily steroid for Crohn's disease management but no record in chart.   Reported her GERD was well controlled on omeprazole.  UA suggested UTI with hematuria.  Patient requested to leave AMA and to come back the next day for further testing.  CBC unremarkable, normal lipase, potassium 3.4, glucose 119, normal LFTs.  No imaging obtained.  OV 04/26/22. GERD controlled with omeprazole daily.  Reports 7-8 large loose stools per day for the prior 3 weeks.  Denied any fever or chills or any recent sick contacts.  Taking Zofran for nausea.  Reports stools were dark/black prior to going to the ED but improved on presentation.  Not on any medication for Crohn's.  States she was given antibiotics by [facility] ED but did not have any change in her stools with this.  Able to eat without issue.  Denied lack of appetite or early satiety.  No weight loss.  Did report to nocturnal stools. Continue omeprazole 40 mg daily. CT A/P ordered. CBC, BMP, Magnesium, CRP, and fecal calprotectin ordered.  CT A/P ordered.   Labs 04/26/22: Hgb 12, MCV 100, plts 230, K 3.3. CRP and Fecal calprotectin normal.    CT A/P 05/05/2022:  -No acute abnormality to explain abdominal pain -3 mm nonobstructing right renal calculus -Remote L1 compression fracture with vertebral changes and 6 mm bony retropulsion   05/23/22 patient reported resolution of diarrhea and feeling well overall.  Plan to repeat stool studies prior to consideration of steroids or antibiotics in the future.   Last office visit 08/08/22.  Feeling pretty well since the beginning of the year.  Denied any abdominal pain, had improvement in diarrhea, usually about 2/day.  Only occasionally loose stools,  generally more formed..  Started on plavix since last visit therefore in December she was switched to pantoprazole.   Today:  States she had a stroke in September and spent 5 days in the hospital and then did rehab and outpatient PT.   Does pretty good overall but since the stroke it has been a little more loose and some mild pain that comes and goes - not constant. No melena or brpbr. Initially had some darker stools when she started the plavix. She has continued the plavix as well.   Last flu shot: October 2024 Last pneumonia shot:  after age 33 - unsure what age.  Last evaluation by dermatology: has not seen one - no skin concerns.  Last zoster vaccine: None Last DEXA scan: May 2023 COVID-19 shot: Initial series done - no boosters  Reflux has been well controlled - No N/V. No dysphagia. Has lost energy since stroke but overall feeling okay. Has lost some weight as well. Her taste has changed since stroke as well. Has ben trying things to get her taste back.   No significant vision changes.   Had a fall shortly after returning home from rehab and when PT was seeing her. Will use a can at times.   Wt Readings from Last 3 Encounters:  08/10/23 106 lb 9.6 oz (48.4 kg)  08/08/22 120 lb 9.6 oz (54.7 kg)  04/26/22 121 lb 12.8 oz (55.2 kg)    Current Outpatient Medications  Medication Sig Dispense Refill   amLODipine (NORVASC) 10 MG tablet Take 10 mg by mouth in the morning.  1   aspirin 81 MG chewable tablet Chew 1 tablet by mouth daily.     Calcium Carb-Cholecalciferol (CALCIUM 600+D3 PO) Take 1 tablet by mouth in the morning and at bedtime.     clopidogrel (PLAVIX) 75 MG tablet SMARTSIG:1 Tablet(s) By Mouth Every Evening     losartan (COZAAR) 50 MG tablet SMARTSIG:1 Tablet(s) By Mouth Every Evening     metoprolol succinate (TOPROL-XL) 25 MG 24 hr tablet SMARTSIG:1 Tablet(s) By Mouth Every Evening     omeprazole (PRILOSEC) 20 MG capsule TAKE 1 CAPSULE BY MOUTH DAILY BEFORE  BREAKFAST. 90 capsule 3   rosuvastatin (CRESTOR) 40 MG tablet Take 40 mg by mouth at bedtime.     No current facility-administered medications for this visit.    Past Medical History:  Diagnosis Date   Crohn's disease (HCC)    used to be followed by Dr. Elder Cyphers. diagnosis in her 30s. multiple surgeries. remicade allergy.   Diabetes (HCC)    GERD (gastroesophageal reflux disease)    HTN (hypertension)    Hyperlipidemia    Hypertension    Osteoporosis        Pancreatitis    09/2015    Past Surgical History:  Procedure Laterality Date   BACTERIAL OVERGROWTH TEST N/A 02/24/2016   POSITIVE for small bowel bacterial overgrowth    BIOPSY  02/10/2016   Procedure: BIOPSY;  Surgeon: West Bali, MD;  Location: [facility];  Service: Endoscopy;;  Random colon biopsies Gastric biopsies for h pylori gastric polyp biopsies   BIOPSY  04/26/2021   Procedure: BIOPSY;  Surgeon: Lanelle Bal, DO;  Location: [facility];  Service: Endoscopy;;   BOWEL RESECTION     TWICE for stricture, including right colectomy with terminal ileal resection   COLONOSCOPY     COLONOSCOPY N/A 02/10/2016   Dr. Darrick Penna: normal neo terminal ileum, diverticula in sigmoid colon, non-bleeding internal hemorrhoids, adenomatous rectal polyp.    COLONOSCOPY WITH PROPOFOL N/A 04/26/2021   Procedure: COLONOSCOPY WITH PROPOFOL;  Surgeon: Lanelle Bal, DO;  Location: [facility];  Service: Endoscopy;  Laterality: N/A;  10:00am   ESOPHAGOGASTRODUODENOSCOPY N/A 02/10/2016   Dr. Darrick Penna: chronic gastritis, small hiatal hernia, few benign-appearing gastric polyps, no source for weight loss.    PARTIAL HYSTERECTOMY     POLYPECTOMY  02/10/2016   Procedure: POLYPECTOMY;  Surgeon: West Bali, MD;  Location: [facility];  Service: Endoscopy;;  Rectal polyps x 2 removed 1 by hot snare and number 2 via cold forceps   RECTOVAGINAL FISTULA CLOSURE  2004   with temporary colostomy and subsequent take down, all at [facility]      Family History  Problem Relation Age of Onset   Colon cancer Father        57s   Inflammatory bowel disease Sister        ???   Colon cancer Brother        48    Allergies as of 08/10/2023 - Review Complete 08/10/2023  Allergen Reaction Noted   Remicade [infliximab] Shortness Of Breath 12/08/2015   Acetaminophen  04/13/2023   Flagyl [metronidazole]  09/11/2019   Lisinopril Cough 12/08/2015   Mercaptopurine Other (See Comments) 06/02/2016   Codeine Rash 12/08/2015    Social History   Socioeconomic History   Marital status: Married    Spouse name: Not on file   Number of children: 1   Years of education: Not on file   Highest education level: Not on file  Occupational History   Occupation: retired  Tobacco Use   Smoking status: Never   Smokeless tobacco: Never  Vaping Use   Vaping status: Never Used  Substance and Sexual Activity   Alcohol use: No    Alcohol/week: 0.0 standard drinks of alcohol   Drug use: No   Sexual activity: Not on file  Other Topics Concern   Not on file  Social History Narrative   Not on file   Social Drivers of Health   Financial Resource Strain: Not on file  Food Insecurity: Not on file  Transportation Needs: Not on file  Physical Activity: Not on file  Stress: Not on file  Social Connections: Not on file     Review of Systems   Gen: Denies fever, chills, anorexia. Denies fatigue, weakness, weight loss.  CV: Denies chest pain, palpitations, syncope, peripheral edema, and claudication. Resp: Denies dyspnea at rest, cough, wheezing, coughing up blood, and pleurisy. GI: See HPI Derm: Denies rash, itching, dry skin Psych: Denies depression, anxiety, memory loss, confusion. No homicidal or suicidal ideation.  Heme: Denies bruising, bleeding, and enlarged lymph nodes.  Physical Exam   BP (!) 144/57 (BP Location: Right Arm, Patient Position: Sitting, Cuff Size: Normal)   Pulse 69   Temp 98.1 F (36.7 C) (Oral)   Ht 4\' 11"   (1.499 m)   Wt 106 lb 9.6 oz (48.4 kg)   SpO2 97%   BMI 21.53 kg/m   General:   Alert and oriented.  Pleasant and cooperative.  Appearing Head:  Normocephalic and atraumatic. Eyes:  Conjuctiva clear without scleral icterus. Abdomen:  +BS, soft, non-distended.  Mild TTP to epigastrium.  No rebound or guarding. No HSM or masses noted. Rectal: deferred Msk:  Symmetrical without gross deformities. Normal posture.  Slow gait Extremities:  Without edema. Neurologic:  Alert and  oriented x4 Psych:  Alert and cooperative. Normal mood and affect.  Assessment  Tamara Todd is a 83 y.o. female with a history of GERD, diabetes, HTN, HLD, osteoporosis, and Chron's disease s/p multiple prior abdominal surgeries and recent stroke presenting today for follow up.   Chron's: History of stricturing disease within the right colon.  She underwent right colectomy with terminal ileal resection and repair of rectovaginal fistula in the past with an ileocolonic anastomosis.  Last colonoscopy in October 2022 with patent anastomosis and mucosal remission of Crohn's.  Has had some mild looser stools since her stroke however overall stools have been well-controlled.  No episodes of melena or BRBPR.  Also denies any nausea or vomiting.  She had a follow-up CT scan in October 2023 due to transient abdominal pain which was negative.  No signs of any infectious etiology or concerns for remission of Crohn's at this time.  We discussed that if she were to develop any of the symptoms we could pursue colonoscopy and discuss medical therapy.  Discussed increasing her protein intake given her weight loss since her stroke, otherwise her weight over the last 1-2 months has been stable per patient.  Will request her most recent blood work from PCP to ensure hemoglobin is remaining stable.  I did encourage her to follow-up with endocrinology in regards to her osteoporosis on her prior DEXA scan.  GERD: Fairly well-controlled on  omeprazole 20 mg once daily.  We have had plans for switching to pantoprazole since her stroke given she has on Plavix but appears there may have been some miscommunication with the pharmacy.  She will check at home to see if her recent prescription is pantoprazole and if so I advised her to stop taking omeprazole and start the pantoprazole.  PLAN   Request blood work from PCP Stop omeprazole and start pantoprazole 20 mg once daily.  GERD diet High-protein diet Follow up with endocrine regarding osteoporosis Follow up in 1 year.   Brooke Bonito, MSN, FNP-BC, AGACNP-BC [facility]

## 2023-08-10 ENCOUNTER — Ambulatory Visit: Payer: Medicare HMO | Admitting: Gastroenterology

## 2023-08-10 ENCOUNTER — Encounter: Payer: Self-pay | Admitting: Gastroenterology

## 2023-08-10 VITALS — BP 144/57 | HR 69 | Temp 98.1°F | Ht 59.0 in | Wt 106.6 lb

## 2023-08-10 DIAGNOSIS — K219 Gastro-esophageal reflux disease without esophagitis: Secondary | ICD-10-CM | POA: Diagnosis not present

## 2023-08-10 DIAGNOSIS — K50919 Crohn's disease, unspecified, with unspecified complications: Secondary | ICD-10-CM | POA: Diagnosis not present

## 2023-08-10 NOTE — Patient Instructions (Addendum)
Please fill out the request form upfront to get your prior records from her primary care.  Please look at your medication bottles at home and see if you have recently picked up pantoprazole.  You should take pantoprazole once daily since you are on Plavix and NOT omeprazole.  Please call the office for instructions if your new medication at home is pantoprazole.  I would recommend that you reach back out to Dr. Fransico Him with endocrine to discuss if you would like to try any medication for osteoporosis.  I recommend that you follow a higher protein diet to help maintain weight and increase your strength.  Follow a GERD diet:  Avoid fried, fatty, greasy, spicy, citrus foods. Avoid caffeine and carbonated beverages. Avoid chocolate. Try eating 4-6 small meals a day rather than 3 large meals. Do not eat within 3 hours of laying down. Prop head of bed up on wood or bricks to create a 6 inch incline.  Please let me know if you develop any black/tarry stools, bright red blood in your stools, worsening abdominal pain, or worsening reflux.  It was a pleasure to see you today. I want to create trusting relationships with patients. If you receive a survey regarding your visit,  I greatly appreciate you taking time to fill this out on paper or through your MyChart. I value your feedback.  Brooke Bonito, MSN, FNP-BC, AGACNP-BC [facility]

## 2023-08-11 ENCOUNTER — Other Ambulatory Visit: Payer: Self-pay | Admitting: Gastroenterology

## 2023-08-11 ENCOUNTER — Telehealth: Payer: Self-pay | Admitting: *Deleted

## 2023-08-11 DIAGNOSIS — K219 Gastro-esophageal reflux disease without esophagitis: Secondary | ICD-10-CM

## 2023-08-11 MED ORDER — PANTOPRAZOLE SODIUM 20 MG PO TBEC: 20.0000 mg | DELAYED_RELEASE_TABLET | Freq: Every day | ORAL | 1 refills | Status: DC

## 2023-08-11 NOTE — Telephone Encounter (Signed)
Noted

## 2023-08-11 NOTE — Telephone Encounter (Signed)
Pt called and states she does not have any pantoprazole at home. She needs you to send a prescription to her pharmacy.

## 2023-09-02 ENCOUNTER — Other Ambulatory Visit: Payer: Self-pay | Admitting: Gastroenterology

## 2023-09-02 DIAGNOSIS — K219 Gastro-esophageal reflux disease without esophagitis: Secondary | ICD-10-CM

## 2024-07-03 ENCOUNTER — Encounter: Payer: Self-pay | Admitting: Gastroenterology
